# Patient Record
Sex: Male | Born: 1950 | Race: White | Hispanic: No | Marital: Married | Smoking: Former smoker
Health system: Southern US, Community
[De-identification: ages and names within clinical notes are randomized; demographics above are authoritative.]

## PROBLEM LIST (undated history)

## (undated) DIAGNOSIS — I5022 Chronic systolic (congestive) heart failure: Secondary | ICD-10-CM

## (undated) DIAGNOSIS — G603 Idiopathic progressive neuropathy: Secondary | ICD-10-CM

## (undated) DIAGNOSIS — C9590 Leukemia, unspecified not having achieved remission: Secondary | ICD-10-CM

## (undated) DIAGNOSIS — R7303 Prediabetes: Secondary | ICD-10-CM

## (undated) DIAGNOSIS — J479 Bronchiectasis, uncomplicated: Secondary | ICD-10-CM

## (undated) DIAGNOSIS — C801 Malignant (primary) neoplasm, unspecified: Secondary | ICD-10-CM

## (undated) DIAGNOSIS — I482 Chronic atrial fibrillation, unspecified: Secondary | ICD-10-CM

## (undated) DIAGNOSIS — I110 Hypertensive heart disease with heart failure: Secondary | ICD-10-CM

## (undated) DIAGNOSIS — B449 Aspergillosis, unspecified: Secondary | ICD-10-CM

## (undated) DIAGNOSIS — I4819 Other persistent atrial fibrillation: Secondary | ICD-10-CM

## (undated) DIAGNOSIS — G4733 Obstructive sleep apnea (adult) (pediatric): Secondary | ICD-10-CM

## (undated) HISTORY — DX: Idiopathic progressive neuropathy: G60.3

## (undated) HISTORY — DX: Chronic systolic (congestive) heart failure: I50.22

## (undated) HISTORY — DX: Obstructive sleep apnea (adult) (pediatric): G47.33

## (undated) HISTORY — PX: WRIST FRACTURE SURGERY: SHX121

## (undated) HISTORY — DX: Prediabetes: R73.03

## (undated) HISTORY — PX: HEMORRHOID SURGERY: SHX153

## (undated) HISTORY — DX: Other persistent atrial fibrillation: I48.19

## (undated) HISTORY — DX: Hypertensive heart disease with heart failure: I11.0

---

## 2012-02-13 ENCOUNTER — Other Ambulatory Visit (HOSPITAL_COMMUNITY)
Admission: RE | Admit: 2012-02-13 | Discharge: 2012-02-13 | Disposition: A | Discharge status: Home / Self Care | Payer: 59 | Source: Ambulatory Visit | Attending: Oncology | Admitting: Oncology

## 2012-02-13 DIAGNOSIS — D61818 Other pancytopenia: Secondary | ICD-10-CM | POA: Insufficient documentation

## 2013-12-29 DIAGNOSIS — Z9481 Bone marrow transplant status: Secondary | ICD-10-CM | POA: Insufficient documentation

## 2014-07-31 DIAGNOSIS — H2513 Age-related nuclear cataract, bilateral: Secondary | ICD-10-CM | POA: Insufficient documentation

## 2014-08-17 DIAGNOSIS — C9201 Acute myeloblastic leukemia, in remission: Secondary | ICD-10-CM | POA: Diagnosis not present

## 2014-08-17 DIAGNOSIS — N390 Urinary tract infection, site not specified: Secondary | ICD-10-CM | POA: Diagnosis not present

## 2014-08-17 DIAGNOSIS — J441 Chronic obstructive pulmonary disease with (acute) exacerbation: Secondary | ICD-10-CM | POA: Diagnosis not present

## 2014-08-17 DIAGNOSIS — Z6829 Body mass index (BMI) 29.0-29.9, adult: Secondary | ICD-10-CM | POA: Diagnosis not present

## 2014-08-19 DIAGNOSIS — J449 Chronic obstructive pulmonary disease, unspecified: Secondary | ICD-10-CM | POA: Diagnosis not present

## 2014-08-20 DIAGNOSIS — R740 Nonspecific elevation of levels of transaminase and lactic acid dehydrogenase [LDH]: Secondary | ICD-10-CM | POA: Diagnosis not present

## 2014-08-20 DIAGNOSIS — R319 Hematuria, unspecified: Secondary | ICD-10-CM | POA: Diagnosis not present

## 2014-08-20 DIAGNOSIS — R748 Abnormal levels of other serum enzymes: Secondary | ICD-10-CM | POA: Diagnosis not present

## 2014-08-20 DIAGNOSIS — J17 Pneumonia in diseases classified elsewhere: Secondary | ICD-10-CM | POA: Diagnosis not present

## 2014-08-20 DIAGNOSIS — Z9484 Stem cells transplant status: Secondary | ICD-10-CM | POA: Diagnosis not present

## 2014-08-20 DIAGNOSIS — K740 Hepatic fibrosis: Secondary | ICD-10-CM | POA: Diagnosis not present

## 2014-08-20 DIAGNOSIS — R7989 Other specified abnormal findings of blood chemistry: Secondary | ICD-10-CM | POA: Diagnosis not present

## 2014-08-20 DIAGNOSIS — C9201 Acute myeloblastic leukemia, in remission: Secondary | ICD-10-CM | POA: Diagnosis not present

## 2014-08-20 DIAGNOSIS — D89813 Graft-versus-host disease, unspecified: Secondary | ICD-10-CM | POA: Diagnosis not present

## 2014-08-20 DIAGNOSIS — B970 Adenovirus as the cause of diseases classified elsewhere: Secondary | ICD-10-CM | POA: Diagnosis not present

## 2014-08-20 DIAGNOSIS — R05 Cough: Secondary | ICD-10-CM | POA: Diagnosis not present

## 2014-08-20 DIAGNOSIS — J129 Viral pneumonia, unspecified: Secondary | ICD-10-CM | POA: Diagnosis not present

## 2014-08-20 DIAGNOSIS — K7689 Other specified diseases of liver: Secondary | ICD-10-CM | POA: Diagnosis not present

## 2014-08-20 DIAGNOSIS — J120 Adenoviral pneumonia: Secondary | ICD-10-CM | POA: Diagnosis not present

## 2014-08-20 DIAGNOSIS — R310 Gross hematuria: Secondary | ICD-10-CM | POA: Diagnosis not present

## 2014-08-20 DIAGNOSIS — N3091 Cystitis, unspecified with hematuria: Secondary | ICD-10-CM | POA: Diagnosis not present

## 2014-08-20 DIAGNOSIS — R198 Other specified symptoms and signs involving the digestive system and abdomen: Secondary | ICD-10-CM | POA: Diagnosis not present

## 2014-08-20 DIAGNOSIS — B348 Other viral infections of unspecified site: Secondary | ICD-10-CM | POA: Diagnosis not present

## 2014-08-20 DIAGNOSIS — J329 Chronic sinusitis, unspecified: Secondary | ICD-10-CM | POA: Diagnosis not present

## 2014-08-20 DIAGNOSIS — B340 Adenovirus infection, unspecified: Secondary | ICD-10-CM | POA: Diagnosis not present

## 2014-08-20 DIAGNOSIS — R918 Other nonspecific abnormal finding of lung field: Secondary | ICD-10-CM | POA: Diagnosis not present

## 2014-08-20 DIAGNOSIS — J1289 Other viral pneumonia: Secondary | ICD-10-CM | POA: Diagnosis not present

## 2014-08-20 DIAGNOSIS — C9200 Acute myeloblastic leukemia, not having achieved remission: Secondary | ICD-10-CM | POA: Diagnosis not present

## 2014-08-25 DIAGNOSIS — R21 Rash and other nonspecific skin eruption: Secondary | ICD-10-CM | POA: Diagnosis not present

## 2014-08-25 DIAGNOSIS — Z79899 Other long term (current) drug therapy: Secondary | ICD-10-CM | POA: Diagnosis not present

## 2014-08-25 DIAGNOSIS — J111 Influenza due to unidentified influenza virus with other respiratory manifestations: Secondary | ICD-10-CM | POA: Diagnosis not present

## 2014-08-25 DIAGNOSIS — Z9484 Stem cells transplant status: Secondary | ICD-10-CM | POA: Diagnosis not present

## 2014-08-25 DIAGNOSIS — R740 Nonspecific elevation of levels of transaminase and lactic acid dehydrogenase [LDH]: Secondary | ICD-10-CM | POA: Diagnosis not present

## 2014-08-25 DIAGNOSIS — N3090 Cystitis, unspecified without hematuria: Secondary | ICD-10-CM | POA: Diagnosis not present

## 2014-08-25 DIAGNOSIS — C9201 Acute myeloblastic leukemia, in remission: Secondary | ICD-10-CM | POA: Diagnosis not present

## 2014-08-25 DIAGNOSIS — R0989 Other specified symptoms and signs involving the circulatory and respiratory systems: Secondary | ICD-10-CM | POA: Diagnosis not present

## 2014-08-25 DIAGNOSIS — R7989 Other specified abnormal findings of blood chemistry: Secondary | ICD-10-CM | POA: Diagnosis not present

## 2014-08-25 DIAGNOSIS — R319 Hematuria, unspecified: Secondary | ICD-10-CM | POA: Diagnosis not present

## 2014-08-28 DIAGNOSIS — R300 Dysuria: Secondary | ICD-10-CM | POA: Diagnosis not present

## 2014-08-28 DIAGNOSIS — R740 Nonspecific elevation of levels of transaminase and lactic acid dehydrogenase [LDH]: Secondary | ICD-10-CM | POA: Diagnosis not present

## 2014-08-28 DIAGNOSIS — B349 Viral infection, unspecified: Secondary | ICD-10-CM | POA: Diagnosis not present

## 2014-08-28 DIAGNOSIS — J069 Acute upper respiratory infection, unspecified: Secondary | ICD-10-CM | POA: Diagnosis not present

## 2014-08-28 DIAGNOSIS — B340 Adenovirus infection, unspecified: Secondary | ICD-10-CM | POA: Diagnosis not present

## 2014-08-28 DIAGNOSIS — B970 Adenovirus as the cause of diseases classified elsewhere: Secondary | ICD-10-CM | POA: Diagnosis not present

## 2014-08-28 DIAGNOSIS — N3081 Other cystitis with hematuria: Secondary | ICD-10-CM | POA: Diagnosis not present

## 2014-08-28 DIAGNOSIS — C9201 Acute myeloblastic leukemia, in remission: Secondary | ICD-10-CM | POA: Diagnosis not present

## 2014-08-28 DIAGNOSIS — Z9484 Stem cells transplant status: Secondary | ICD-10-CM | POA: Diagnosis not present

## 2014-09-07 DIAGNOSIS — Z95828 Presence of other vascular implants and grafts: Secondary | ICD-10-CM | POA: Diagnosis not present

## 2014-09-07 DIAGNOSIS — Z9484 Stem cells transplant status: Secondary | ICD-10-CM | POA: Diagnosis not present

## 2014-09-07 DIAGNOSIS — B340 Adenovirus infection, unspecified: Secondary | ICD-10-CM | POA: Diagnosis not present

## 2014-09-07 DIAGNOSIS — C9201 Acute myeloblastic leukemia, in remission: Secondary | ICD-10-CM | POA: Diagnosis not present

## 2014-09-07 DIAGNOSIS — T865 Complications of stem cell transplant: Secondary | ICD-10-CM | POA: Diagnosis not present

## 2014-09-07 DIAGNOSIS — Z9481 Bone marrow transplant status: Secondary | ICD-10-CM | POA: Diagnosis not present

## 2014-09-07 DIAGNOSIS — I481 Persistent atrial fibrillation: Secondary | ICD-10-CM | POA: Diagnosis not present

## 2014-09-14 DIAGNOSIS — J984 Other disorders of lung: Secondary | ICD-10-CM | POA: Diagnosis not present

## 2014-09-14 DIAGNOSIS — T865 Complications of stem cell transplant: Secondary | ICD-10-CM | POA: Diagnosis not present

## 2014-09-14 DIAGNOSIS — M488X9 Other specified spondylopathies, site unspecified: Secondary | ICD-10-CM | POA: Diagnosis not present

## 2014-09-14 DIAGNOSIS — D7581 Myelofibrosis: Secondary | ICD-10-CM | POA: Diagnosis not present

## 2014-09-14 DIAGNOSIS — I5189 Other ill-defined heart diseases: Secondary | ICD-10-CM | POA: Diagnosis not present

## 2014-09-14 DIAGNOSIS — I348 Other nonrheumatic mitral valve disorders: Secondary | ICD-10-CM | POA: Diagnosis not present

## 2014-09-14 DIAGNOSIS — J189 Pneumonia, unspecified organism: Secondary | ICD-10-CM | POA: Diagnosis not present

## 2014-09-14 DIAGNOSIS — D89813 Graft-versus-host disease, unspecified: Secondary | ICD-10-CM | POA: Diagnosis not present

## 2014-09-14 DIAGNOSIS — Z9484 Stem cells transplant status: Secondary | ICD-10-CM | POA: Diagnosis not present

## 2014-09-14 DIAGNOSIS — I2510 Atherosclerotic heart disease of native coronary artery without angina pectoris: Secondary | ICD-10-CM | POA: Diagnosis not present

## 2014-09-14 DIAGNOSIS — I313 Pericardial effusion (noninflammatory): Secondary | ICD-10-CM | POA: Diagnosis not present

## 2014-09-14 DIAGNOSIS — I700 Atherosclerosis of aorta: Secondary | ICD-10-CM | POA: Diagnosis not present

## 2014-09-14 DIAGNOSIS — T8603 Bone marrow transplant infection: Secondary | ICD-10-CM | POA: Diagnosis not present

## 2014-09-14 DIAGNOSIS — R911 Solitary pulmonary nodule: Secondary | ICD-10-CM | POA: Diagnosis not present

## 2014-09-14 DIAGNOSIS — Z95828 Presence of other vascular implants and grafts: Secondary | ICD-10-CM | POA: Diagnosis not present

## 2014-09-14 DIAGNOSIS — Z9481 Bone marrow transplant status: Secondary | ICD-10-CM | POA: Diagnosis not present

## 2014-09-14 DIAGNOSIS — J120 Adenoviral pneumonia: Secondary | ICD-10-CM | POA: Diagnosis not present

## 2014-09-14 DIAGNOSIS — N3090 Cystitis, unspecified without hematuria: Secondary | ICD-10-CM | POA: Diagnosis not present

## 2014-09-14 DIAGNOSIS — C9201 Acute myeloblastic leukemia, in remission: Secondary | ICD-10-CM | POA: Diagnosis not present

## 2014-09-14 DIAGNOSIS — I358 Other nonrheumatic aortic valve disorders: Secondary | ICD-10-CM | POA: Diagnosis not present

## 2014-09-18 DIAGNOSIS — B340 Adenovirus infection, unspecified: Secondary | ICD-10-CM | POA: Diagnosis not present

## 2014-09-18 DIAGNOSIS — C9201 Acute myeloblastic leukemia, in remission: Secondary | ICD-10-CM | POA: Diagnosis not present

## 2014-09-21 DIAGNOSIS — T380X5D Adverse effect of glucocorticoids and synthetic analogues, subsequent encounter: Secondary | ICD-10-CM | POA: Diagnosis not present

## 2014-09-21 DIAGNOSIS — Z794 Long term (current) use of insulin: Secondary | ICD-10-CM | POA: Diagnosis not present

## 2014-09-21 DIAGNOSIS — Z792 Long term (current) use of antibiotics: Secondary | ICD-10-CM | POA: Diagnosis not present

## 2014-09-21 DIAGNOSIS — Z9484 Stem cells transplant status: Secondary | ICD-10-CM | POA: Diagnosis not present

## 2014-09-21 DIAGNOSIS — C9201 Acute myeloblastic leukemia, in remission: Secondary | ICD-10-CM | POA: Diagnosis not present

## 2014-09-21 DIAGNOSIS — Z7952 Long term (current) use of systemic steroids: Secondary | ICD-10-CM | POA: Diagnosis not present

## 2014-09-21 DIAGNOSIS — E099 Drug or chemical induced diabetes mellitus without complications: Secondary | ICD-10-CM | POA: Diagnosis not present

## 2014-09-21 DIAGNOSIS — D89813 Graft-versus-host disease, unspecified: Secondary | ICD-10-CM | POA: Diagnosis not present

## 2014-10-01 DIAGNOSIS — Z9484 Stem cells transplant status: Secondary | ICD-10-CM | POA: Diagnosis not present

## 2014-10-01 DIAGNOSIS — B441 Other pulmonary aspergillosis: Secondary | ICD-10-CM | POA: Diagnosis not present

## 2014-10-01 DIAGNOSIS — C9201 Acute myeloblastic leukemia, in remission: Secondary | ICD-10-CM | POA: Diagnosis not present

## 2014-10-01 DIAGNOSIS — D89810 Acute graft-versus-host disease: Secondary | ICD-10-CM | POA: Diagnosis not present

## 2014-10-05 DIAGNOSIS — D89811 Chronic graft-versus-host disease: Secondary | ICD-10-CM | POA: Diagnosis not present

## 2014-10-05 DIAGNOSIS — Z8619 Personal history of other infectious and parasitic diseases: Secondary | ICD-10-CM | POA: Diagnosis not present

## 2014-10-05 DIAGNOSIS — Z792 Long term (current) use of antibiotics: Secondary | ICD-10-CM | POA: Diagnosis not present

## 2014-10-05 DIAGNOSIS — R739 Hyperglycemia, unspecified: Secondary | ICD-10-CM | POA: Diagnosis not present

## 2014-10-05 DIAGNOSIS — Z794 Long term (current) use of insulin: Secondary | ICD-10-CM | POA: Diagnosis not present

## 2014-10-05 DIAGNOSIS — T380X5D Adverse effect of glucocorticoids and synthetic analogues, subsequent encounter: Secondary | ICD-10-CM | POA: Diagnosis not present

## 2014-10-05 DIAGNOSIS — C9200 Acute myeloblastic leukemia, not having achieved remission: Secondary | ICD-10-CM | POA: Diagnosis not present

## 2014-10-05 DIAGNOSIS — Z7952 Long term (current) use of systemic steroids: Secondary | ICD-10-CM | POA: Diagnosis not present

## 2014-10-05 DIAGNOSIS — Z833 Family history of diabetes mellitus: Secondary | ICD-10-CM | POA: Diagnosis not present

## 2014-10-05 DIAGNOSIS — Z9484 Stem cells transplant status: Secondary | ICD-10-CM | POA: Diagnosis not present

## 2014-10-05 DIAGNOSIS — B340 Adenovirus infection, unspecified: Secondary | ICD-10-CM | POA: Diagnosis not present

## 2014-10-05 DIAGNOSIS — C9201 Acute myeloblastic leukemia, in remission: Secondary | ICD-10-CM | POA: Diagnosis not present

## 2014-10-05 DIAGNOSIS — E099 Drug or chemical induced diabetes mellitus without complications: Secondary | ICD-10-CM | POA: Diagnosis not present

## 2014-10-12 DIAGNOSIS — B441 Other pulmonary aspergillosis: Secondary | ICD-10-CM | POA: Diagnosis not present

## 2014-10-12 DIAGNOSIS — C9201 Acute myeloblastic leukemia, in remission: Secondary | ICD-10-CM | POA: Diagnosis not present

## 2014-10-12 DIAGNOSIS — Z9484 Stem cells transplant status: Secondary | ICD-10-CM | POA: Diagnosis not present

## 2014-10-19 DIAGNOSIS — Z9481 Bone marrow transplant status: Secondary | ICD-10-CM | POA: Diagnosis not present

## 2014-10-19 DIAGNOSIS — Z794 Long term (current) use of insulin: Secondary | ICD-10-CM | POA: Diagnosis not present

## 2014-10-19 DIAGNOSIS — C9201 Acute myeloblastic leukemia, in remission: Secondary | ICD-10-CM | POA: Diagnosis not present

## 2014-10-19 DIAGNOSIS — C9200 Acute myeloblastic leukemia, not having achieved remission: Secondary | ICD-10-CM | POA: Diagnosis not present

## 2014-10-19 DIAGNOSIS — R739 Hyperglycemia, unspecified: Secondary | ICD-10-CM | POA: Diagnosis not present

## 2014-10-19 DIAGNOSIS — T380X5D Adverse effect of glucocorticoids and synthetic analogues, subsequent encounter: Secondary | ICD-10-CM | POA: Diagnosis not present

## 2014-10-19 DIAGNOSIS — B340 Adenovirus infection, unspecified: Secondary | ICD-10-CM | POA: Diagnosis not present

## 2014-10-19 DIAGNOSIS — J449 Chronic obstructive pulmonary disease, unspecified: Secondary | ICD-10-CM | POA: Diagnosis not present

## 2014-10-19 DIAGNOSIS — Z9484 Stem cells transplant status: Secondary | ICD-10-CM | POA: Diagnosis not present

## 2014-10-19 DIAGNOSIS — E099 Drug or chemical induced diabetes mellitus without complications: Secondary | ICD-10-CM | POA: Diagnosis not present

## 2014-10-26 DIAGNOSIS — Z9484 Stem cells transplant status: Secondary | ICD-10-CM | POA: Diagnosis not present

## 2014-10-26 DIAGNOSIS — D89810 Acute graft-versus-host disease: Secondary | ICD-10-CM | POA: Diagnosis not present

## 2014-10-26 DIAGNOSIS — C9201 Acute myeloblastic leukemia, in remission: Secondary | ICD-10-CM | POA: Diagnosis not present

## 2014-10-26 DIAGNOSIS — B441 Other pulmonary aspergillosis: Secondary | ICD-10-CM | POA: Diagnosis not present

## 2014-11-02 DIAGNOSIS — Z9484 Stem cells transplant status: Secondary | ICD-10-CM | POA: Diagnosis not present

## 2014-11-02 DIAGNOSIS — T865 Complications of stem cell transplant: Secondary | ICD-10-CM | POA: Diagnosis not present

## 2014-11-02 DIAGNOSIS — R748 Abnormal levels of other serum enzymes: Secondary | ICD-10-CM | POA: Diagnosis not present

## 2014-11-02 DIAGNOSIS — Z8619 Personal history of other infectious and parasitic diseases: Secondary | ICD-10-CM | POA: Diagnosis not present

## 2014-11-02 DIAGNOSIS — Z9481 Bone marrow transplant status: Secondary | ICD-10-CM | POA: Diagnosis not present

## 2014-11-02 DIAGNOSIS — C9201 Acute myeloblastic leukemia, in remission: Secondary | ICD-10-CM | POA: Diagnosis not present

## 2014-11-16 DIAGNOSIS — Z9484 Stem cells transplant status: Secondary | ICD-10-CM | POA: Diagnosis not present

## 2014-11-16 DIAGNOSIS — D899 Disorder involving the immune mechanism, unspecified: Secondary | ICD-10-CM | POA: Diagnosis not present

## 2014-11-16 DIAGNOSIS — Z7901 Long term (current) use of anticoagulants: Secondary | ICD-10-CM | POA: Diagnosis not present

## 2014-11-16 DIAGNOSIS — D89811 Chronic graft-versus-host disease: Secondary | ICD-10-CM | POA: Diagnosis not present

## 2014-11-16 DIAGNOSIS — R001 Bradycardia, unspecified: Secondary | ICD-10-CM | POA: Diagnosis not present

## 2014-11-16 DIAGNOSIS — I4891 Unspecified atrial fibrillation: Secondary | ICD-10-CM | POA: Diagnosis not present

## 2014-11-16 DIAGNOSIS — Z79899 Other long term (current) drug therapy: Secondary | ICD-10-CM | POA: Diagnosis not present

## 2014-11-16 DIAGNOSIS — I499 Cardiac arrhythmia, unspecified: Secondary | ICD-10-CM | POA: Diagnosis not present

## 2014-11-16 DIAGNOSIS — B340 Adenovirus infection, unspecified: Secondary | ICD-10-CM | POA: Diagnosis not present

## 2014-11-16 DIAGNOSIS — Z9481 Bone marrow transplant status: Secondary | ICD-10-CM | POA: Diagnosis not present

## 2014-11-16 DIAGNOSIS — Z794 Long term (current) use of insulin: Secondary | ICD-10-CM | POA: Diagnosis not present

## 2014-11-16 DIAGNOSIS — E099 Drug or chemical induced diabetes mellitus without complications: Secondary | ICD-10-CM | POA: Diagnosis not present

## 2014-11-16 DIAGNOSIS — C9201 Acute myeloblastic leukemia, in remission: Secondary | ICD-10-CM | POA: Diagnosis not present

## 2014-11-17 DIAGNOSIS — Z7952 Long term (current) use of systemic steroids: Secondary | ICD-10-CM | POA: Diagnosis not present

## 2014-11-17 DIAGNOSIS — E099 Drug or chemical induced diabetes mellitus without complications: Secondary | ICD-10-CM | POA: Diagnosis not present

## 2014-11-17 DIAGNOSIS — Z794 Long term (current) use of insulin: Secondary | ICD-10-CM | POA: Diagnosis not present

## 2014-11-17 DIAGNOSIS — E1165 Type 2 diabetes mellitus with hyperglycemia: Secondary | ICD-10-CM | POA: Diagnosis not present

## 2014-11-17 DIAGNOSIS — Z87891 Personal history of nicotine dependence: Secondary | ICD-10-CM | POA: Diagnosis not present

## 2014-11-17 DIAGNOSIS — T380X5A Adverse effect of glucocorticoids and synthetic analogues, initial encounter: Secondary | ICD-10-CM | POA: Diagnosis not present

## 2014-11-20 DIAGNOSIS — R0602 Shortness of breath: Secondary | ICD-10-CM | POA: Diagnosis not present

## 2014-11-20 DIAGNOSIS — I4891 Unspecified atrial fibrillation: Secondary | ICD-10-CM | POA: Diagnosis not present

## 2014-11-20 DIAGNOSIS — R0609 Other forms of dyspnea: Secondary | ICD-10-CM | POA: Diagnosis not present

## 2014-11-20 DIAGNOSIS — R942 Abnormal results of pulmonary function studies: Secondary | ICD-10-CM | POA: Diagnosis not present

## 2014-11-20 DIAGNOSIS — C9201 Acute myeloblastic leukemia, in remission: Secondary | ICD-10-CM | POA: Diagnosis not present

## 2014-11-20 DIAGNOSIS — Z885 Allergy status to narcotic agent status: Secondary | ICD-10-CM | POA: Diagnosis not present

## 2014-11-20 DIAGNOSIS — Z888 Allergy status to other drugs, medicaments and biological substances status: Secondary | ICD-10-CM | POA: Diagnosis not present

## 2014-11-20 DIAGNOSIS — Z9481 Bone marrow transplant status: Secondary | ICD-10-CM | POA: Diagnosis not present

## 2014-11-20 DIAGNOSIS — R5383 Other fatigue: Secondary | ICD-10-CM | POA: Diagnosis not present

## 2014-11-20 DIAGNOSIS — I481 Persistent atrial fibrillation: Secondary | ICD-10-CM | POA: Diagnosis not present

## 2014-11-20 DIAGNOSIS — J479 Bronchiectasis, uncomplicated: Secondary | ICD-10-CM | POA: Diagnosis not present

## 2014-11-20 DIAGNOSIS — D89811 Chronic graft-versus-host disease: Secondary | ICD-10-CM | POA: Diagnosis not present

## 2014-11-20 DIAGNOSIS — D89813 Graft-versus-host disease, unspecified: Secondary | ICD-10-CM | POA: Diagnosis not present

## 2014-11-20 DIAGNOSIS — Z87891 Personal history of nicotine dependence: Secondary | ICD-10-CM | POA: Diagnosis not present

## 2014-11-23 DIAGNOSIS — Z8614 Personal history of Methicillin resistant Staphylococcus aureus infection: Secondary | ICD-10-CM | POA: Diagnosis not present

## 2014-11-23 DIAGNOSIS — C9201 Acute myeloblastic leukemia, in remission: Secondary | ICD-10-CM | POA: Diagnosis not present

## 2014-11-23 DIAGNOSIS — Z888 Allergy status to other drugs, medicaments and biological substances status: Secondary | ICD-10-CM | POA: Diagnosis not present

## 2014-11-23 DIAGNOSIS — Z7901 Long term (current) use of anticoagulants: Secondary | ICD-10-CM | POA: Diagnosis not present

## 2014-11-23 DIAGNOSIS — Z87891 Personal history of nicotine dependence: Secondary | ICD-10-CM | POA: Diagnosis not present

## 2014-11-23 DIAGNOSIS — I4891 Unspecified atrial fibrillation: Secondary | ICD-10-CM | POA: Diagnosis not present

## 2014-11-23 DIAGNOSIS — Z9481 Bone marrow transplant status: Secondary | ICD-10-CM | POA: Diagnosis not present

## 2014-11-23 DIAGNOSIS — Z885 Allergy status to narcotic agent status: Secondary | ICD-10-CM | POA: Diagnosis not present

## 2014-11-23 DIAGNOSIS — I481 Persistent atrial fibrillation: Secondary | ICD-10-CM | POA: Diagnosis not present

## 2014-11-23 DIAGNOSIS — H2513 Age-related nuclear cataract, bilateral: Secondary | ICD-10-CM | POA: Diagnosis not present

## 2014-11-30 DIAGNOSIS — C9200 Acute myeloblastic leukemia, not having achieved remission: Secondary | ICD-10-CM | POA: Diagnosis not present

## 2014-11-30 DIAGNOSIS — D89811 Chronic graft-versus-host disease: Secondary | ICD-10-CM | POA: Diagnosis not present

## 2014-11-30 DIAGNOSIS — Z794 Long term (current) use of insulin: Secondary | ICD-10-CM | POA: Diagnosis not present

## 2014-11-30 DIAGNOSIS — E099 Drug or chemical induced diabetes mellitus without complications: Secondary | ICD-10-CM | POA: Diagnosis not present

## 2014-11-30 DIAGNOSIS — C9201 Acute myeloblastic leukemia, in remission: Secondary | ICD-10-CM | POA: Diagnosis not present

## 2014-11-30 DIAGNOSIS — Z95818 Presence of other cardiac implants and grafts: Secondary | ICD-10-CM | POA: Diagnosis not present

## 2014-11-30 DIAGNOSIS — T380X5A Adverse effect of glucocorticoids and synthetic analogues, initial encounter: Secondary | ICD-10-CM | POA: Diagnosis not present

## 2014-11-30 DIAGNOSIS — Z79899 Other long term (current) drug therapy: Secondary | ICD-10-CM | POA: Diagnosis not present

## 2014-11-30 DIAGNOSIS — D7581 Myelofibrosis: Secondary | ICD-10-CM | POA: Diagnosis not present

## 2014-11-30 DIAGNOSIS — Z9481 Bone marrow transplant status: Secondary | ICD-10-CM | POA: Diagnosis not present

## 2014-11-30 DIAGNOSIS — Z9484 Stem cells transplant status: Secondary | ICD-10-CM | POA: Diagnosis not present

## 2014-11-30 DIAGNOSIS — I4891 Unspecified atrial fibrillation: Secondary | ICD-10-CM | POA: Diagnosis not present

## 2014-12-09 DIAGNOSIS — I4891 Unspecified atrial fibrillation: Secondary | ICD-10-CM | POA: Diagnosis not present

## 2014-12-09 DIAGNOSIS — Z6827 Body mass index (BMI) 27.0-27.9, adult: Secondary | ICD-10-CM | POA: Diagnosis not present

## 2014-12-16 DIAGNOSIS — D539 Nutritional anemia, unspecified: Secondary | ICD-10-CM | POA: Diagnosis not present

## 2014-12-16 DIAGNOSIS — C9201 Acute myeloblastic leukemia, in remission: Secondary | ICD-10-CM | POA: Diagnosis not present

## 2014-12-16 DIAGNOSIS — Z9484 Stem cells transplant status: Secondary | ICD-10-CM | POA: Diagnosis not present

## 2014-12-23 DIAGNOSIS — S161XXA Strain of muscle, fascia and tendon at neck level, initial encounter: Secondary | ICD-10-CM | POA: Diagnosis not present

## 2014-12-23 DIAGNOSIS — Z6828 Body mass index (BMI) 28.0-28.9, adult: Secondary | ICD-10-CM | POA: Diagnosis not present

## 2014-12-24 DIAGNOSIS — S161XXD Strain of muscle, fascia and tendon at neck level, subsequent encounter: Secondary | ICD-10-CM | POA: Diagnosis not present

## 2014-12-28 DIAGNOSIS — S161XXD Strain of muscle, fascia and tendon at neck level, subsequent encounter: Secondary | ICD-10-CM | POA: Diagnosis not present

## 2014-12-31 DIAGNOSIS — S161XXD Strain of muscle, fascia and tendon at neck level, subsequent encounter: Secondary | ICD-10-CM | POA: Diagnosis not present

## 2015-01-13 DIAGNOSIS — Z9484 Stem cells transplant status: Secondary | ICD-10-CM | POA: Diagnosis not present

## 2015-01-13 DIAGNOSIS — E099 Drug or chemical induced diabetes mellitus without complications: Secondary | ICD-10-CM | POA: Diagnosis not present

## 2015-01-13 DIAGNOSIS — C9201 Acute myeloblastic leukemia, in remission: Secondary | ICD-10-CM | POA: Diagnosis not present

## 2015-01-13 DIAGNOSIS — Z006 Encounter for examination for normal comparison and control in clinical research program: Secondary | ICD-10-CM | POA: Diagnosis not present

## 2015-01-13 DIAGNOSIS — D89811 Chronic graft-versus-host disease: Secondary | ICD-10-CM | POA: Diagnosis not present

## 2015-01-13 DIAGNOSIS — I482 Chronic atrial fibrillation: Secondary | ICD-10-CM | POA: Diagnosis not present

## 2015-01-13 DIAGNOSIS — Z794 Long term (current) use of insulin: Secondary | ICD-10-CM | POA: Diagnosis not present

## 2015-01-13 DIAGNOSIS — D89813 Graft-versus-host disease, unspecified: Secondary | ICD-10-CM | POA: Diagnosis not present

## 2015-01-13 DIAGNOSIS — T380X5A Adverse effect of glucocorticoids and synthetic analogues, initial encounter: Secondary | ICD-10-CM | POA: Diagnosis not present

## 2015-01-13 DIAGNOSIS — R7989 Other specified abnormal findings of blood chemistry: Secondary | ICD-10-CM | POA: Diagnosis not present

## 2015-01-13 DIAGNOSIS — R740 Nonspecific elevation of levels of transaminase and lactic acid dehydrogenase [LDH]: Secondary | ICD-10-CM | POA: Diagnosis not present

## 2015-01-13 DIAGNOSIS — Z9481 Bone marrow transplant status: Secondary | ICD-10-CM | POA: Diagnosis not present

## 2015-01-20 DIAGNOSIS — R740 Nonspecific elevation of levels of transaminase and lactic acid dehydrogenase [LDH]: Secondary | ICD-10-CM | POA: Diagnosis not present

## 2015-01-20 DIAGNOSIS — C9202 Acute myeloblastic leukemia, in relapse: Secondary | ICD-10-CM | POA: Diagnosis not present

## 2015-01-20 DIAGNOSIS — Z9484 Stem cells transplant status: Secondary | ICD-10-CM | POA: Diagnosis not present

## 2015-01-28 DIAGNOSIS — Z87891 Personal history of nicotine dependence: Secondary | ICD-10-CM | POA: Diagnosis not present

## 2015-01-28 DIAGNOSIS — Z8619 Personal history of other infectious and parasitic diseases: Secondary | ICD-10-CM | POA: Diagnosis not present

## 2015-01-28 DIAGNOSIS — C9201 Acute myeloblastic leukemia, in remission: Secondary | ICD-10-CM | POA: Diagnosis not present

## 2015-01-28 DIAGNOSIS — D89811 Chronic graft-versus-host disease: Secondary | ICD-10-CM | POA: Diagnosis not present

## 2015-01-28 DIAGNOSIS — I4891 Unspecified atrial fibrillation: Secondary | ICD-10-CM | POA: Diagnosis not present

## 2015-01-28 DIAGNOSIS — Z9484 Stem cells transplant status: Secondary | ICD-10-CM | POA: Diagnosis not present

## 2015-01-28 DIAGNOSIS — Z9481 Bone marrow transplant status: Secondary | ICD-10-CM | POA: Diagnosis not present

## 2015-01-28 DIAGNOSIS — T865 Complications of stem cell transplant: Secondary | ICD-10-CM | POA: Diagnosis not present

## 2015-01-28 DIAGNOSIS — I252 Old myocardial infarction: Secondary | ICD-10-CM | POA: Diagnosis not present

## 2015-01-28 DIAGNOSIS — I481 Persistent atrial fibrillation: Secondary | ICD-10-CM | POA: Diagnosis not present

## 2015-01-28 DIAGNOSIS — D89813 Graft-versus-host disease, unspecified: Secondary | ICD-10-CM | POA: Diagnosis not present

## 2015-01-28 DIAGNOSIS — C9202 Acute myeloblastic leukemia, in relapse: Secondary | ICD-10-CM | POA: Diagnosis not present

## 2015-01-29 DIAGNOSIS — Z9481 Bone marrow transplant status: Secondary | ICD-10-CM | POA: Diagnosis not present

## 2015-01-29 DIAGNOSIS — R0683 Snoring: Secondary | ICD-10-CM | POA: Diagnosis not present

## 2015-01-29 DIAGNOSIS — G4730 Sleep apnea, unspecified: Secondary | ICD-10-CM | POA: Diagnosis not present

## 2015-02-10 DIAGNOSIS — G4733 Obstructive sleep apnea (adult) (pediatric): Secondary | ICD-10-CM | POA: Diagnosis not present

## 2015-02-10 DIAGNOSIS — Z6828 Body mass index (BMI) 28.0-28.9, adult: Secondary | ICD-10-CM | POA: Diagnosis not present

## 2015-02-22 DIAGNOSIS — Z9481 Bone marrow transplant status: Secondary | ICD-10-CM | POA: Diagnosis not present

## 2015-02-22 DIAGNOSIS — C9201 Acute myeloblastic leukemia, in remission: Secondary | ICD-10-CM | POA: Diagnosis not present

## 2015-02-22 DIAGNOSIS — C9200 Acute myeloblastic leukemia, not having achieved remission: Secondary | ICD-10-CM | POA: Diagnosis not present

## 2015-02-22 DIAGNOSIS — R740 Nonspecific elevation of levels of transaminase and lactic acid dehydrogenase [LDH]: Secondary | ICD-10-CM | POA: Diagnosis not present

## 2015-02-22 DIAGNOSIS — T8601 Bone marrow transplant rejection: Secondary | ICD-10-CM | POA: Diagnosis not present

## 2015-02-22 DIAGNOSIS — Z7901 Long term (current) use of anticoagulants: Secondary | ICD-10-CM | POA: Diagnosis not present

## 2015-02-22 DIAGNOSIS — D89811 Chronic graft-versus-host disease: Secondary | ICD-10-CM | POA: Diagnosis not present

## 2015-02-22 DIAGNOSIS — Z79899 Other long term (current) drug therapy: Secondary | ICD-10-CM | POA: Diagnosis not present

## 2015-02-26 DIAGNOSIS — G4733 Obstructive sleep apnea (adult) (pediatric): Secondary | ICD-10-CM | POA: Diagnosis not present

## 2015-02-26 DIAGNOSIS — R740 Nonspecific elevation of levels of transaminase and lactic acid dehydrogenase [LDH]: Secondary | ICD-10-CM | POA: Diagnosis not present

## 2015-02-26 DIAGNOSIS — J479 Bronchiectasis, uncomplicated: Secondary | ICD-10-CM | POA: Diagnosis not present

## 2015-02-26 DIAGNOSIS — D89813 Graft-versus-host disease, unspecified: Secondary | ICD-10-CM | POA: Diagnosis not present

## 2015-02-26 DIAGNOSIS — R942 Abnormal results of pulmonary function studies: Secondary | ICD-10-CM | POA: Diagnosis not present

## 2015-03-01 DIAGNOSIS — G4752 REM sleep behavior disorder: Secondary | ICD-10-CM | POA: Diagnosis not present

## 2015-03-01 DIAGNOSIS — Z6830 Body mass index (BMI) 30.0-30.9, adult: Secondary | ICD-10-CM | POA: Diagnosis not present

## 2015-03-01 DIAGNOSIS — G4733 Obstructive sleep apnea (adult) (pediatric): Secondary | ICD-10-CM | POA: Diagnosis not present

## 2015-03-05 DIAGNOSIS — J479 Bronchiectasis, uncomplicated: Secondary | ICD-10-CM | POA: Diagnosis not present

## 2015-03-05 DIAGNOSIS — J90 Pleural effusion, not elsewhere classified: Secondary | ICD-10-CM | POA: Diagnosis not present

## 2015-03-05 DIAGNOSIS — R942 Abnormal results of pulmonary function studies: Secondary | ICD-10-CM | POA: Diagnosis not present

## 2015-03-22 DIAGNOSIS — J811 Chronic pulmonary edema: Secondary | ICD-10-CM | POA: Diagnosis not present

## 2015-03-22 DIAGNOSIS — Z9484 Stem cells transplant status: Secondary | ICD-10-CM | POA: Diagnosis not present

## 2015-03-22 DIAGNOSIS — E8770 Fluid overload, unspecified: Secondary | ICD-10-CM | POA: Diagnosis not present

## 2015-03-22 DIAGNOSIS — C9201 Acute myeloblastic leukemia, in remission: Secondary | ICD-10-CM | POA: Diagnosis not present

## 2015-03-22 DIAGNOSIS — D89813 Graft-versus-host disease, unspecified: Secondary | ICD-10-CM | POA: Diagnosis not present

## 2015-03-22 DIAGNOSIS — Z9481 Bone marrow transplant status: Secondary | ICD-10-CM | POA: Diagnosis not present

## 2015-03-22 DIAGNOSIS — I5031 Acute diastolic (congestive) heart failure: Secondary | ICD-10-CM | POA: Diagnosis not present

## 2015-03-22 DIAGNOSIS — Z23 Encounter for immunization: Secondary | ICD-10-CM | POA: Diagnosis not present

## 2015-03-22 DIAGNOSIS — I509 Heart failure, unspecified: Secondary | ICD-10-CM | POA: Diagnosis not present

## 2015-04-26 DIAGNOSIS — Z8619 Personal history of other infectious and parasitic diseases: Secondary | ICD-10-CM | POA: Diagnosis not present

## 2015-04-26 DIAGNOSIS — Z9481 Bone marrow transplant status: Secondary | ICD-10-CM | POA: Diagnosis not present

## 2015-04-26 DIAGNOSIS — J479 Bronchiectasis, uncomplicated: Secondary | ICD-10-CM | POA: Diagnosis not present

## 2015-04-26 DIAGNOSIS — C9201 Acute myeloblastic leukemia, in remission: Secondary | ICD-10-CM | POA: Diagnosis not present

## 2015-04-26 DIAGNOSIS — D89811 Chronic graft-versus-host disease: Secondary | ICD-10-CM | POA: Diagnosis not present

## 2015-04-26 DIAGNOSIS — I482 Chronic atrial fibrillation: Secondary | ICD-10-CM | POA: Diagnosis not present

## 2015-04-26 DIAGNOSIS — Z7901 Long term (current) use of anticoagulants: Secondary | ICD-10-CM | POA: Diagnosis not present

## 2015-04-26 DIAGNOSIS — Z9484 Stem cells transplant status: Secondary | ICD-10-CM | POA: Diagnosis not present

## 2015-05-04 DIAGNOSIS — J479 Bronchiectasis, uncomplicated: Secondary | ICD-10-CM | POA: Diagnosis not present

## 2015-05-04 DIAGNOSIS — Z79899 Other long term (current) drug therapy: Secondary | ICD-10-CM | POA: Diagnosis not present

## 2015-05-04 DIAGNOSIS — D89813 Graft-versus-host disease, unspecified: Secondary | ICD-10-CM | POA: Diagnosis not present

## 2015-05-04 DIAGNOSIS — D89811 Chronic graft-versus-host disease: Secondary | ICD-10-CM | POA: Diagnosis not present

## 2015-05-04 DIAGNOSIS — Z7951 Long term (current) use of inhaled steroids: Secondary | ICD-10-CM | POA: Diagnosis not present

## 2015-05-04 DIAGNOSIS — G4733 Obstructive sleep apnea (adult) (pediatric): Secondary | ICD-10-CM | POA: Diagnosis not present

## 2015-05-04 DIAGNOSIS — I480 Paroxysmal atrial fibrillation: Secondary | ICD-10-CM | POA: Diagnosis not present

## 2015-05-04 DIAGNOSIS — Z23 Encounter for immunization: Secondary | ICD-10-CM | POA: Diagnosis not present

## 2015-05-04 DIAGNOSIS — Z7901 Long term (current) use of anticoagulants: Secondary | ICD-10-CM | POA: Diagnosis not present

## 2015-05-04 DIAGNOSIS — C9201 Acute myeloblastic leukemia, in remission: Secondary | ICD-10-CM | POA: Diagnosis not present

## 2015-05-04 DIAGNOSIS — Z792 Long term (current) use of antibiotics: Secondary | ICD-10-CM | POA: Diagnosis not present

## 2015-05-24 DIAGNOSIS — J479 Bronchiectasis, uncomplicated: Secondary | ICD-10-CM | POA: Diagnosis not present

## 2015-05-24 DIAGNOSIS — Z23 Encounter for immunization: Secondary | ICD-10-CM | POA: Diagnosis not present

## 2015-05-24 DIAGNOSIS — C9201 Acute myeloblastic leukemia, in remission: Secondary | ICD-10-CM | POA: Diagnosis not present

## 2015-05-24 DIAGNOSIS — D89811 Chronic graft-versus-host disease: Secondary | ICD-10-CM | POA: Diagnosis not present

## 2015-05-24 DIAGNOSIS — Z2803 Immunization not carried out because of immune compromised state of patient: Secondary | ICD-10-CM | POA: Diagnosis not present

## 2015-05-24 DIAGNOSIS — Z9481 Bone marrow transplant status: Secondary | ICD-10-CM | POA: Diagnosis not present

## 2015-06-09 DIAGNOSIS — C9201 Acute myeloblastic leukemia, in remission: Secondary | ICD-10-CM | POA: Diagnosis not present

## 2015-06-09 DIAGNOSIS — Z9481 Bone marrow transplant status: Secondary | ICD-10-CM | POA: Diagnosis not present

## 2015-06-09 DIAGNOSIS — L438 Other lichen planus: Secondary | ICD-10-CM | POA: Diagnosis not present

## 2015-06-09 DIAGNOSIS — D89811 Chronic graft-versus-host disease: Secondary | ICD-10-CM | POA: Diagnosis not present

## 2015-06-30 DIAGNOSIS — Z6830 Body mass index (BMI) 30.0-30.9, adult: Secondary | ICD-10-CM | POA: Diagnosis not present

## 2015-06-30 DIAGNOSIS — J441 Chronic obstructive pulmonary disease with (acute) exacerbation: Secondary | ICD-10-CM | POA: Diagnosis not present

## 2015-07-05 DIAGNOSIS — Z9481 Bone marrow transplant status: Secondary | ICD-10-CM | POA: Diagnosis not present

## 2015-07-05 DIAGNOSIS — R7989 Other specified abnormal findings of blood chemistry: Secondary | ICD-10-CM | POA: Diagnosis not present

## 2015-07-05 DIAGNOSIS — Z9484 Stem cells transplant status: Secondary | ICD-10-CM | POA: Diagnosis not present

## 2015-07-05 DIAGNOSIS — R61 Generalized hyperhidrosis: Secondary | ICD-10-CM | POA: Diagnosis not present

## 2015-07-05 DIAGNOSIS — I509 Heart failure, unspecified: Secondary | ICD-10-CM | POA: Diagnosis not present

## 2015-07-05 DIAGNOSIS — I4891 Unspecified atrial fibrillation: Secondary | ICD-10-CM | POA: Diagnosis not present

## 2015-07-05 DIAGNOSIS — C9201 Acute myeloblastic leukemia, in remission: Secondary | ICD-10-CM | POA: Diagnosis not present

## 2015-07-05 DIAGNOSIS — J479 Bronchiectasis, uncomplicated: Secondary | ICD-10-CM | POA: Diagnosis not present

## 2015-07-05 DIAGNOSIS — D89811 Chronic graft-versus-host disease: Secondary | ICD-10-CM | POA: Diagnosis not present

## 2015-07-05 DIAGNOSIS — Z8619 Personal history of other infectious and parasitic diseases: Secondary | ICD-10-CM | POA: Diagnosis not present

## 2015-09-09 DIAGNOSIS — Z9481 Bone marrow transplant status: Secondary | ICD-10-CM | POA: Diagnosis not present

## 2015-09-09 DIAGNOSIS — Z452 Encounter for adjustment and management of vascular access device: Secondary | ICD-10-CM | POA: Diagnosis not present

## 2015-09-09 DIAGNOSIS — R7989 Other specified abnormal findings of blood chemistry: Secondary | ICD-10-CM | POA: Diagnosis not present

## 2015-09-09 DIAGNOSIS — Z23 Encounter for immunization: Secondary | ICD-10-CM | POA: Diagnosis not present

## 2015-09-09 DIAGNOSIS — C9201 Acute myeloblastic leukemia, in remission: Secondary | ICD-10-CM | POA: Diagnosis not present

## 2015-09-09 DIAGNOSIS — Z79899 Other long term (current) drug therapy: Secondary | ICD-10-CM | POA: Diagnosis not present

## 2015-09-09 DIAGNOSIS — I4891 Unspecified atrial fibrillation: Secondary | ICD-10-CM | POA: Diagnosis not present

## 2015-09-27 DIAGNOSIS — J019 Acute sinusitis, unspecified: Secondary | ICD-10-CM | POA: Diagnosis not present

## 2015-09-27 DIAGNOSIS — Z6831 Body mass index (BMI) 31.0-31.9, adult: Secondary | ICD-10-CM | POA: Diagnosis not present

## 2015-10-04 DIAGNOSIS — J019 Acute sinusitis, unspecified: Secondary | ICD-10-CM | POA: Diagnosis not present

## 2015-10-04 DIAGNOSIS — R7309 Other abnormal glucose: Secondary | ICD-10-CM | POA: Diagnosis not present

## 2015-10-04 DIAGNOSIS — Z6830 Body mass index (BMI) 30.0-30.9, adult: Secondary | ICD-10-CM | POA: Diagnosis not present

## 2015-10-04 DIAGNOSIS — Z9481 Bone marrow transplant status: Secondary | ICD-10-CM | POA: Diagnosis not present

## 2015-10-06 DIAGNOSIS — K1321 Leukoplakia of oral mucosa, including tongue: Secondary | ICD-10-CM | POA: Diagnosis not present

## 2015-11-01 ENCOUNTER — Inpatient Hospital Stay (HOSPITAL_COMMUNITY)
Admission: EM | Admit: 2015-11-01 | Discharge: 2015-11-05 | DRG: 871 | Disposition: A | Discharge status: Home / Self Care | Payer: Medicare Other | Attending: Internal Medicine | Admitting: Internal Medicine

## 2015-11-01 ENCOUNTER — Emergency Department (HOSPITAL_COMMUNITY): Payer: Medicare Other

## 2015-11-01 ENCOUNTER — Encounter (HOSPITAL_COMMUNITY): Payer: Self-pay | Admitting: Emergency Medicine

## 2015-11-01 DIAGNOSIS — J9600 Acute respiratory failure, unspecified whether with hypoxia or hypercapnia: Secondary | ICD-10-CM | POA: Diagnosis not present

## 2015-11-01 DIAGNOSIS — I959 Hypotension, unspecified: Secondary | ICD-10-CM | POA: Diagnosis present

## 2015-11-01 DIAGNOSIS — F419 Anxiety disorder, unspecified: Secondary | ICD-10-CM | POA: Diagnosis present

## 2015-11-01 DIAGNOSIS — G4733 Obstructive sleep apnea (adult) (pediatric): Secondary | ICD-10-CM | POA: Diagnosis present

## 2015-11-01 DIAGNOSIS — B349 Viral infection, unspecified: Secondary | ICD-10-CM | POA: Diagnosis present

## 2015-11-01 DIAGNOSIS — A419 Sepsis, unspecified organism: Principal | ICD-10-CM | POA: Diagnosis present

## 2015-11-01 DIAGNOSIS — Z885 Allergy status to narcotic agent status: Secondary | ICD-10-CM | POA: Diagnosis not present

## 2015-11-01 DIAGNOSIS — R509 Fever, unspecified: Secondary | ICD-10-CM | POA: Diagnosis not present

## 2015-11-01 DIAGNOSIS — C9201 Acute myeloblastic leukemia, in remission: Secondary | ICD-10-CM | POA: Diagnosis present

## 2015-11-01 DIAGNOSIS — R404 Transient alteration of awareness: Secondary | ICD-10-CM | POA: Diagnosis not present

## 2015-11-01 DIAGNOSIS — R6520 Severe sepsis without septic shock: Secondary | ICD-10-CM

## 2015-11-01 DIAGNOSIS — Z888 Allergy status to other drugs, medicaments and biological substances status: Secondary | ICD-10-CM | POA: Diagnosis not present

## 2015-11-01 DIAGNOSIS — I481 Persistent atrial fibrillation: Secondary | ICD-10-CM

## 2015-11-01 DIAGNOSIS — J9601 Acute respiratory failure with hypoxia: Secondary | ICD-10-CM | POA: Diagnosis present

## 2015-11-01 DIAGNOSIS — D649 Anemia, unspecified: Secondary | ICD-10-CM | POA: Diagnosis present

## 2015-11-01 DIAGNOSIS — N390 Urinary tract infection, site not specified: Secondary | ICD-10-CM | POA: Diagnosis present

## 2015-11-01 DIAGNOSIS — I447 Left bundle-branch block, unspecified: Secondary | ICD-10-CM | POA: Diagnosis present

## 2015-11-01 DIAGNOSIS — Z9484 Stem cells transplant status: Secondary | ICD-10-CM

## 2015-11-01 DIAGNOSIS — Z87891 Personal history of nicotine dependence: Secondary | ICD-10-CM | POA: Diagnosis not present

## 2015-11-01 DIAGNOSIS — I248 Other forms of acute ischemic heart disease: Secondary | ICD-10-CM | POA: Diagnosis present

## 2015-11-01 DIAGNOSIS — I5021 Acute systolic (congestive) heart failure: Secondary | ICD-10-CM | POA: Diagnosis not present

## 2015-11-01 DIAGNOSIS — E663 Overweight: Secondary | ICD-10-CM | POA: Diagnosis present

## 2015-11-01 DIAGNOSIS — I5023 Acute on chronic systolic (congestive) heart failure: Secondary | ICD-10-CM | POA: Diagnosis not present

## 2015-11-01 DIAGNOSIS — R739 Hyperglycemia, unspecified: Secondary | ICD-10-CM | POA: Diagnosis present

## 2015-11-01 DIAGNOSIS — I471 Supraventricular tachycardia: Secondary | ICD-10-CM | POA: Diagnosis present

## 2015-11-01 DIAGNOSIS — J449 Chronic obstructive pulmonary disease, unspecified: Secondary | ICD-10-CM | POA: Diagnosis present

## 2015-11-01 DIAGNOSIS — Z6830 Body mass index (BMI) 30.0-30.9, adult: Secondary | ICD-10-CM | POA: Diagnosis not present

## 2015-11-01 DIAGNOSIS — R6521 Severe sepsis with septic shock: Secondary | ICD-10-CM | POA: Diagnosis present

## 2015-11-01 DIAGNOSIS — Z881 Allergy status to other antibiotic agents status: Secondary | ICD-10-CM | POA: Diagnosis not present

## 2015-11-01 DIAGNOSIS — J9690 Respiratory failure, unspecified, unspecified whether with hypoxia or hypercapnia: Secondary | ICD-10-CM

## 2015-11-01 DIAGNOSIS — I5043 Acute on chronic combined systolic (congestive) and diastolic (congestive) heart failure: Secondary | ICD-10-CM | POA: Diagnosis not present

## 2015-11-01 DIAGNOSIS — R0602 Shortness of breath: Secondary | ICD-10-CM | POA: Diagnosis not present

## 2015-11-01 DIAGNOSIS — I509 Heart failure, unspecified: Secondary | ICD-10-CM | POA: Diagnosis not present

## 2015-11-01 DIAGNOSIS — Z8701 Personal history of pneumonia (recurrent): Secondary | ICD-10-CM

## 2015-11-01 DIAGNOSIS — R531 Weakness: Secondary | ICD-10-CM | POA: Diagnosis not present

## 2015-11-01 DIAGNOSIS — I482 Chronic atrial fibrillation: Secondary | ICD-10-CM | POA: Diagnosis present

## 2015-11-01 DIAGNOSIS — R05 Cough: Secondary | ICD-10-CM | POA: Diagnosis not present

## 2015-11-01 DIAGNOSIS — N179 Acute kidney failure, unspecified: Secondary | ICD-10-CM | POA: Diagnosis present

## 2015-11-01 DIAGNOSIS — I4891 Unspecified atrial fibrillation: Secondary | ICD-10-CM | POA: Diagnosis present

## 2015-11-01 DIAGNOSIS — I2119 ST elevation (STEMI) myocardial infarction involving other coronary artery of inferior wall: Secondary | ICD-10-CM | POA: Diagnosis not present

## 2015-11-01 HISTORY — DX: Leukemia, unspecified not having achieved remission: C95.90

## 2015-11-01 HISTORY — DX: Obstructive sleep apnea (adult) (pediatric): G47.33

## 2015-11-01 HISTORY — DX: Malignant (primary) neoplasm, unspecified: C80.1

## 2015-11-01 HISTORY — DX: Bronchiectasis, uncomplicated: J47.9

## 2015-11-01 HISTORY — DX: Chronic atrial fibrillation, unspecified: I48.20

## 2015-11-01 HISTORY — DX: Aspergillosis, unspecified: B44.9

## 2015-11-01 LAB — CBC WITH DIFFERENTIAL/PLATELET
Basophils Absolute: 0 10*3/uL (ref 0.0–0.1)
Basophils Relative: 0 %
EOS PCT: 0 %
Eosinophils Absolute: 0 10*3/uL (ref 0.0–0.7)
HEMATOCRIT: 37.8 % — AB (ref 39.0–52.0)
Hemoglobin: 12.6 g/dL — ABNORMAL LOW (ref 13.0–17.0)
LYMPHS ABS: 2 10*3/uL (ref 0.7–4.0)
Lymphocytes Relative: 9 %
MCH: 33.9 pg (ref 26.0–34.0)
MCHC: 33.3 g/dL (ref 30.0–36.0)
MCV: 101.6 fL — ABNORMAL HIGH (ref 78.0–100.0)
MONO ABS: 1.8 10*3/uL — AB (ref 0.1–1.0)
Monocytes Relative: 8 %
NEUTROS ABS: 18.7 10*3/uL — AB (ref 1.7–7.7)
Neutrophils Relative %: 83 %
Platelets: 118 10*3/uL — ABNORMAL LOW (ref 150–400)
RBC: 3.72 MIL/uL — AB (ref 4.22–5.81)
RDW: 13.9 % (ref 11.5–15.5)
WBC Morphology: INCREASED
WBC: 22.5 10*3/uL — AB (ref 4.0–10.5)

## 2015-11-01 LAB — URINALYSIS, ROUTINE W REFLEX MICROSCOPIC
GLUCOSE, UA: NEGATIVE mg/dL
KETONES UR: 15 mg/dL — AB
NITRITE: POSITIVE — AB
PH: 5 (ref 5.0–8.0)
Protein, ur: 100 mg/dL — AB
Specific Gravity, Urine: 1.024 (ref 1.005–1.030)

## 2015-11-01 LAB — I-STAT CG4 LACTIC ACID, ED
Lactic Acid, Venous: 5.02 mmol/L (ref 0.5–2.0)
Lactic Acid, Venous: 5.81 mmol/L (ref 0.5–2.0)

## 2015-11-01 LAB — COMPREHENSIVE METABOLIC PANEL
ALBUMIN: 2.9 g/dL — AB (ref 3.5–5.0)
ALK PHOS: 61 U/L (ref 38–126)
ALT: 50 U/L (ref 17–63)
AST: 55 U/L — AB (ref 15–41)
Anion gap: 15 (ref 5–15)
BUN: 20 mg/dL (ref 6–20)
CHLORIDE: 104 mmol/L (ref 101–111)
CO2: 19 mmol/L — AB (ref 22–32)
CREATININE: 1.74 mg/dL — AB (ref 0.61–1.24)
Calcium: 8.6 mg/dL — ABNORMAL LOW (ref 8.9–10.3)
GFR calc non Af Amer: 39 mL/min — ABNORMAL LOW (ref >60)
GFR, EST AFRICAN AMERICAN: 46 mL/min — AB (ref >60)
GLUCOSE: 151 mg/dL — AB (ref 65–99)
Potassium: 4.5 mmol/L (ref 3.5–5.1)
SODIUM: 138 mmol/L (ref 135–145)
TOTAL PROTEIN: 7.1 g/dL (ref 6.5–8.1)
Total Bilirubin: 1.7 mg/dL — ABNORMAL HIGH (ref 0.3–1.2)

## 2015-11-01 LAB — INFLUENZA PANEL BY PCR (TYPE A & B)
H1N1 flu by pcr: NOT DETECTED
INFLBPCR: NEGATIVE
Influenza A By PCR: NEGATIVE

## 2015-11-01 LAB — URINE MICROSCOPIC-ADD ON

## 2015-11-01 LAB — TROPONIN I
TROPONIN I: 0.11 ng/mL — AB (ref <0.031)
Troponin I: 0.05 ng/mL — ABNORMAL HIGH (ref <0.031)

## 2015-11-01 LAB — GLUCOSE, CAPILLARY: Glucose-Capillary: 151 mg/dL — ABNORMAL HIGH (ref 65–99)

## 2015-11-01 LAB — LACTIC ACID, PLASMA: Lactic Acid, Venous: 4.8 mmol/L (ref 0.5–2.0)

## 2015-11-01 LAB — CORTISOL: Cortisol, Plasma: 27.4 ug/dL

## 2015-11-01 LAB — PROTIME-INR
INR: 1.6 — ABNORMAL HIGH (ref 0.00–1.49)
PROTHROMBIN TIME: 19.1 s — AB (ref 11.6–15.2)

## 2015-11-01 LAB — I-STAT TROPONIN, ED: Troponin i, poc: 0.04 ng/mL (ref 0.00–0.08)

## 2015-11-01 LAB — BRAIN NATRIURETIC PEPTIDE: B Natriuretic Peptide: 977.3 pg/mL — ABNORMAL HIGH (ref 0.0–100.0)

## 2015-11-01 MED ORDER — FLUTICASONE PROPIONATE 50 MCG/ACT NA SUSP
1.0000 | Freq: Every day | NASAL | Status: DC
  Administered 2015-11-02 – 2015-11-05 (×4): 1 via NASAL
  Filled 2015-11-01: qty 16

## 2015-11-01 MED ORDER — DEXTROSE 5 % IV SOLN
2.0000 g | INTRAVENOUS | Status: DC
  Administered 2015-11-02 – 2015-11-04 (×3): 2 g via INTRAVENOUS
  Filled 2015-11-01 (×4): qty 2

## 2015-11-01 MED ORDER — AMIODARONE HCL IN DEXTROSE 360-4.14 MG/200ML-% IV SOLN
60.0000 mg/h | INTRAVENOUS | Status: AC
  Administered 2015-11-01 (×2): 60 mg/h via INTRAVENOUS
  Filled 2015-11-01: qty 200

## 2015-11-01 MED ORDER — LEVALBUTEROL HCL 0.63 MG/3ML IN NEBU
0.6300 mg | INHALATION_SOLUTION | Freq: Four times a day (QID) | RESPIRATORY_TRACT | Status: DC
  Administered 2015-11-01 – 2015-11-04 (×9): 0.63 mg via RESPIRATORY_TRACT
  Filled 2015-11-01 (×11): qty 3

## 2015-11-01 MED ORDER — FUROSEMIDE 10 MG/ML IJ SOLN
INTRAMUSCULAR | Status: AC
  Filled 2015-11-01: qty 2

## 2015-11-01 MED ORDER — IPRATROPIUM BROMIDE 0.02 % IN SOLN
0.5000 mg | Freq: Four times a day (QID) | RESPIRATORY_TRACT | Status: DC
  Administered 2015-11-01 – 2015-11-04 (×10): 0.5 mg via RESPIRATORY_TRACT
  Filled 2015-11-01 (×11): qty 2.5

## 2015-11-01 MED ORDER — DEXTROSE 5 % IV SOLN
500.0000 mg | INTRAVENOUS | Status: DC
  Filled 2015-11-01: qty 500

## 2015-11-01 MED ORDER — FUROSEMIDE 10 MG/ML IJ SOLN
60.0000 mg | Freq: Once | INTRAMUSCULAR | Status: DC
  Filled 2015-11-01: qty 6

## 2015-11-01 MED ORDER — SODIUM CHLORIDE 0.9% FLUSH
3.0000 mL | Freq: Two times a day (BID) | INTRAVENOUS | Status: DC
  Administered 2015-11-01 – 2015-11-04 (×5): 3 mL via INTRAVENOUS

## 2015-11-01 MED ORDER — DEXTROSE 5 % IV SOLN
1.0000 g | Freq: Once | INTRAVENOUS | Status: AC
  Administered 2015-11-01: 1 g via INTRAVENOUS
  Filled 2015-11-01: qty 10

## 2015-11-01 MED ORDER — DEXTROSE 5 % IV SOLN
500.0000 mg | INTRAVENOUS | Status: DC
  Administered 2015-11-02: 500 mg via INTRAVENOUS
  Filled 2015-11-01: qty 500

## 2015-11-01 MED ORDER — METOPROLOL TARTRATE 1 MG/ML IV SOLN
2.5000 mg | Freq: Three times a day (TID) | INTRAVENOUS | Status: DC
  Administered 2015-11-02: 2.5 mg via INTRAVENOUS
  Filled 2015-11-01 (×2): qty 5

## 2015-11-01 MED ORDER — NOREPINEPHRINE BITARTRATE 1 MG/ML IV SOLN: 0.0000 ug/min | INTRAVENOUS | Status: DC

## 2015-11-01 MED ORDER — PHENYLEPHRINE HCL 10 MG/ML IJ SOLN
30.0000 ug/min | INTRAVENOUS | Status: DC
  Filled 2015-11-01: qty 1

## 2015-11-01 MED ORDER — SODIUM CHLORIDE 0.9 % IV BOLUS (SEPSIS)
1000.0000 mL | INTRAVENOUS | Status: AC
Start: 2015-11-01 — End: 2015-11-01
  Administered 2015-11-01 (×3): 1000 mL via INTRAVENOUS

## 2015-11-01 MED ORDER — SODIUM CHLORIDE 0.9 % IV SOLN
250.0000 mL | INTRAVENOUS | Status: DC | PRN
  Administered 2015-11-01: 250 mL via INTRAVENOUS

## 2015-11-01 MED ORDER — FUROSEMIDE 10 MG/ML IJ SOLN
INTRAMUSCULAR | Status: AC
  Filled 2015-11-01: qty 4

## 2015-11-01 MED ORDER — PANTOPRAZOLE SODIUM 40 MG IV SOLR: 40.0000 mg | Freq: Every day | INTRAVENOUS | Status: DC

## 2015-11-01 MED ORDER — ASPIRIN 300 MG RE SUPP: 300.0000 mg | RECTAL | Status: AC

## 2015-11-01 MED ORDER — DEXTROSE 5 % IV SOLN: 1.0000 g | INTRAVENOUS | Status: DC

## 2015-11-01 MED ORDER — AMIODARONE HCL IN DEXTROSE 360-4.14 MG/200ML-% IV SOLN
30.0000 mg/h | INTRAVENOUS | Status: DC
  Administered 2015-11-02 – 2015-11-03 (×3): 30 mg/h via INTRAVENOUS
  Filled 2015-11-01 (×7): qty 200

## 2015-11-01 MED ORDER — PANTOPRAZOLE SODIUM 40 MG PO TBEC
40.0000 mg | DELAYED_RELEASE_TABLET | Freq: Every day | ORAL | Status: DC
  Administered 2015-11-01 – 2015-11-04 (×4): 40 mg via ORAL
  Filled 2015-11-01 (×4): qty 1

## 2015-11-01 MED ORDER — APIXABAN 5 MG PO TABS
5.0000 mg | ORAL_TABLET | Freq: Two times a day (BID) | ORAL | Status: DC
  Administered 2015-11-01 – 2015-11-05 (×8): 5 mg via ORAL
  Filled 2015-11-01 (×10): qty 1

## 2015-11-01 MED ORDER — LEVALBUTEROL HCL 0.63 MG/3ML IN NEBU: 0.6300 mg | INHALATION_SOLUTION | Freq: Four times a day (QID) | RESPIRATORY_TRACT | Status: DC

## 2015-11-01 MED ORDER — AMIODARONE LOAD VIA INFUSION
150.0000 mg | Freq: Once | INTRAVENOUS | Status: AC
  Administered 2015-11-01: 150 mg via INTRAVENOUS
  Filled 2015-11-01: qty 83.34

## 2015-11-01 MED ORDER — ACETAMINOPHEN 500 MG PO TABS
1000.0000 mg | ORAL_TABLET | Freq: Once | ORAL | Status: AC
  Administered 2015-11-01: 1000 mg via ORAL
  Filled 2015-11-01: qty 2

## 2015-11-01 MED ORDER — DEXTROSE 5 % IV SOLN
500.0000 mg | Freq: Once | INTRAVENOUS | Status: AC
  Administered 2015-11-01: 500 mg via INTRAVENOUS
  Filled 2015-11-01: qty 500

## 2015-11-01 MED ORDER — MONTELUKAST SODIUM 10 MG PO TABS
10.0000 mg | ORAL_TABLET | Freq: Every day | ORAL | Status: DC
  Administered 2015-11-01 – 2015-11-04 (×4): 10 mg via ORAL
  Filled 2015-11-01 (×6): qty 1

## 2015-11-01 MED ORDER — FUROSEMIDE 10 MG/ML IJ SOLN
60.0000 mg | Freq: Once | INTRAMUSCULAR | Status: AC
  Administered 2015-11-01: 60 mg via INTRAVENOUS

## 2015-11-01 MED ORDER — SODIUM CHLORIDE 0.9% FLUSH: 3.0000 mL | INTRAVENOUS | Status: DC | PRN

## 2015-11-01 MED ORDER — AMIODARONE LOAD VIA INFUSION
150.0000 mg | Freq: Once | INTRAVENOUS | Status: AC
Start: 2015-11-01 — End: 2015-11-01
  Administered 2015-11-01: 150 mg via INTRAVENOUS

## 2015-11-01 MED ORDER — LEVALBUTEROL HCL 1.25 MG/0.5ML IN NEBU
1.2500 mg | INHALATION_SOLUTION | Freq: Once | RESPIRATORY_TRACT | Status: AC
  Administered 2015-11-01: 1.25 mg via RESPIRATORY_TRACT
  Filled 2015-11-01: qty 0.5

## 2015-11-01 MED ORDER — LORATADINE 10 MG PO TABS
10.0000 mg | ORAL_TABLET | Freq: Every day | ORAL | Status: DC
  Administered 2015-11-02 – 2015-11-05 (×4): 10 mg via ORAL
  Filled 2015-11-01 (×6): qty 1

## 2015-11-01 MED ORDER — ASPIRIN 81 MG PO CHEW: 324.0000 mg | CHEWABLE_TABLET | ORAL | Status: AC

## 2015-11-01 MED ORDER — SODIUM CHLORIDE 0.9 % IV SOLN: 250.0000 mL | INTRAVENOUS | Status: DC | PRN

## 2015-11-01 NOTE — Progress Notes (Signed)
Pt stated that he wears CPAP at home. Pt refuse CPAP for the night. Pt stated that he feels fine right now. Told pt if he experiences SOB/increase WOB throughout the night to please let RN know. Pt verbalized understanding. Pt is stable at this time no complication or distress noted.

## 2015-11-01 NOTE — H&P (Signed)
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 He has a 3 day history of subjective fevers, congestion, productive cough, and pleuritic chest pain from coughing. He presented to his PCP with these complaints 3/20 and was noted to be hypotensive there. He was also in AF-RVR. He was sent to the emergency department and received one liter IVF en route.  In the emergency department he was tachypneic with wheeze, and remained hypotensive, however BP had somewhat improved after the one liter. He was given xopenex neb which significantly helped his breathing and per RN reports he appeared much more comfortable after this. He received an additional 2+ liters which his BP responded nicely to, however his respiratory status worsened again. Repeat CXR showed worsening bilateral infiltrates concerning for pneumonitis vs edema. UA was notable for possible UTI with positive nitrites, however bacteria and leuks were small. Lactic acid elevated to 5.0. PCCM consulted.  PAST MEDICAL HISTORY :  He  has a past medical history of Cancer ([place]) and Leukemia ([place]).  PAST SURGICAL HISTORY: He  has no past surgical history on file.  Allergies  Allergen Reactions  . Codeine Nausea And Vomiting  . Levaquin [Levofloxacin In D5w]   . Phenergan [Promethazine Hcl]     Wild   . Tramadol Nausea And Vomiting    No current facility-administered medications on file prior to encounter.   No current outpatient prescriptions on file prior to encounter.    FAMILY HISTORY:  His  has no family status information on file.   SOCIAL HISTORY: He  reports that he has quit smoking. He does not have any smokeless tobacco history on file. He reports that he does not drink alcohol or use illicit drugs.  REVIEW OF SYSTEMS:   Bolds are positive  Constitutional: weight loss, gain, night sweats, Fevers, chills, fatigue .  HEENT: headaches, Sore throat, sneezing, nasal congestion, post nasal drip, Difficulty swallowing, Tooth/dental problems, visual complaints visual changes, ear ache CV:  chest pain, radiates: ,Orthopnea, PND, swelling in lower extremities, dizziness, palpitations, syncope.  GI  heartburn, indigestion, abdominal pain, nausea, vomiting, diarrhea, change in bowel habits, loss of appetite, bloody stools.  Resp: cough, productive: brown/yellow sputum , hemoptysis, dyspnea, chest pain, pleuritic.  Skin: rash or itching or icterus GU: dysuria, change in color of urine, urgency or frequency. flank pain, hematuria  MS: joint pain or swelling. decreased range of motion  Psych: change in mood or affect. depression or anxiety.  Neuro: difficulty with speech, weakness, numbness, ataxia   SUBJECTIVE:   VITAL SIGNS: BP 116/81 mmHg  Pulse 146  Temp(Src) 97.8 F (36.6 C) (Oral)  Resp 37  Ht '5\' 10"'$  (1.778 m)  Wt 95.709 kg (211 lb)  BMI 30.28 kg/m2  SpO2 92%  HEMODYNAMICS:    VENTILATOR SETTINGS:    INTAKE / OUTPUT:    PHYSICAL EXAMINATION: General:  Overweight male in moderate distress Neuro:  Alert, oriented, non-focal HEENT:  [place]/AT, PERRL, no appreciable JVD Cardiovascular:  IRIR tachy, no MRG Lungs:  Coarse bilateral breath sounds Abdomen:  Soft, non-tender,  non-distended Musculoskeletal:  No acute deformity or ROM limitation.  Skin:  Grossly intact  LABS:  BMET  Recent Labs Lab 11/01/15 1214  NA 138  K 4.5  CL 104  CO2 19*  BUN 20  CREATININE 1.74*  GLUCOSE 151*    Electrolytes  Recent Labs Lab 11/01/15 1214  CALCIUM 8.6*     CBC  Recent Labs Lab 11/01/15 1214  WBC 22.5*  HGB 12.6*  HCT 37.8*  PLT 118*    Coag's No results for input(s): APTT, INR in the last 168 hours.  Sepsis Markers  Recent Labs Lab 11/01/15 1224  LATICACIDVEN 5.02*    ABG No results for input(s): PHART, PCO2ART, PO2ART in the last 168 hours.  Liver Enzymes  Recent Labs Lab 11/01/15 1214  AST 55*  ALT 50  ALKPHOS 61  BILITOT 1.7*  ALBUMIN 2.9*    Cardiac Enzymes No results for input(s): TROPONINI, PROBNP in the last 168 hours.  Glucose No results for input(s): GLUCAP in the last 168 hours.  Imaging Dg Chest Port 1 View  11/01/2015  CLINICAL DATA:  Cough and congestion for 3 days. History of atrial fibrillation. EXAM: PORTABLE CHEST 1 VIEW COMPARISON:  None. FINDINGS: 1149 hours. The heart size and mediastinal contours are normal. There is aortic atherosclerosis. Patchy retrocardiac left lower lobe airspace disease may reflect atelectasis or an infiltrate. The right lung is clear. There is no edema, pleural effusion or pneumothorax. The bones appear unremarkable. IMPRESSION: Left lower lobe atelectasis or infiltrate. A lateral view may be helpful when the patient is able. Followup PA and lateral chest X-ray is recommended in 3-4 weeks, following trial of antibiotic therapy if clinically warranted, to ensure resolution and exclude underlying malignancy. Electronically Signed   By: Richardean Sale M.D.   On: 11/01/2015 12:09     STUDIES:    CULTURES: BCx2 3/20 > Urine 3/20 > Sputum 3/20 > RVP 3/20 > FLU PCR neg  ANTIBIOTICS: Ceftraixone 3/20 > Azithromycin 3/20 >   SIGNIFICANT EVENTS: 3/20 admit  LINES/TUBES:   ASSESSMENT / PLAN:  PULMONARY A: Acute hypoxemic respiratory failure secondary to CAP vs Viral pneumonitis vs Pulmonary edema COPD without acute exacerbation OSA on CPAP History of aspergillosis PNA  P:   Continue BiPAP PRN, hope to avoid intubation Repeat CXR in AM Check  ABG Scheduled , PRN nebs, defer steroids.   CARDIOVASCULAR A:  Atrial fibrillation with RVR, suspect this is respiratory mediated Acute on chronic CHF Elevated lactic  P:  Telemetry monitoring Hope RVR will correct with BiPAP support, may need amiodarone Echo Repeat lactic acid Cycle troponin Lasix '60mg'$  IV  Metoprolol 2.5 q 8 hours starting 1800  RENAL A:   AKI  P:   S/p 3L, IVF to saline lock Follow BMP Correct electrolytes as indicated  GASTROINTESTINAL A:   No acute issues  P:   NPO on BiPAP PPI for SUP  HEMATOLOGIC A:   On Eliquis for AF Mild anemia  P:  Continue eliquis Follow CBC  INFECTIOUS A:   Severe sepsis, unclear etiology likely pulmonary vs urinary source.  P:   ABX as above Follow cultures Trend PCT Follow WBC and fever curves  ENDOCRINE A:   Hyperglycemia without history DM   P:   Monitor glucose on BMP  NEUROLOGIC A:   No acute issues  P:   RASS goal: 0 Follow BMP   FAMILY  - Updates: patient and wife updated in ED  - Inter-disciplinary family meet or Palliative  Care meeting due by:  3/27   Georgann Housekeeper, AGACNP-BC [facility] Pager [phone] or [phone]  11/01/2015 3:36 PM

## 2015-11-01 NOTE — ED Notes (Signed)
Pt c/o bloating to abdomen, EDP aware and at bedside to assess pt. Pt tachypneic. Pt received approx 156m from 3rd bolus.

## 2015-11-01 NOTE — Progress Notes (Signed)
eLink Physician-Brief Progress Note Patient Name: Patrick Durham DOB: [birth year] MRN: [id number]   Date of Service  11/01/2015  HPI/Events of Note  Hypotension and elevated Lactic Acid. BP = 80/50. Lactic Acid 5.02 >> 5.81. Has had 3 liters of crystalloid. Pro-BNP = 977.  eICU Interventions  Will order: 1. Norepinephrine IV infusion. 2. Notified nurse practitioner, Georgann Housekeeper, of need for central venous line placement.     Intervention Category Intermediate Interventions: Hypotension - evaluation and management  Lupe Bonner Eugene 11/01/2015, 4:06 PM

## 2015-11-01 NOTE — Progress Notes (Signed)
Pt is off NIV at this time per MD Mcquaid, pt has been titrated to a [place] 3L. No distress or complications noted. Pt is comfortable at this time.

## 2015-11-01 NOTE — Progress Notes (Signed)
Pharmacy Code Sepsis Protocol  Time of code sepsis page: 1143 '[x]'$  Antibiotics delivered at 1148 '[]'$  Antibiotics administered prior to code at  (if checked, omit next 2 questions)  Were antibiotics ordered at the time of the code sepsis page? Yes Was it required to contact the physician? '[x]'$  Physician not contacted '[]'$  Physician contacted to order antibiotics for code sepsis '[]'$  Physician contacted to recommend changing antibiotics  Pharmacy consulted for: N/A  Anti-infectives    Start     Dose/Rate Route Frequency Ordered Stop   11/02/15 1200  azithromycin (ZITHROMAX) 500 mg in dextrose 5 % 250 mL IVPB     500 mg 250 mL/hr over 60 Minutes Intravenous Every 24 hours 11/01/15 1136     11/02/15 1200  cefTRIAXone (ROCEPHIN) 1 g in dextrose 5 % 50 mL IVPB     1 g 100 mL/hr over 30 Minutes Intravenous Every 24 hours 11/01/15 1136     11/01/15 1145  cefTRIAXone (ROCEPHIN) 1 g in dextrose 5 % 50 mL IVPB     1 g 100 mL/hr over 30 Minutes Intravenous  Once 11/01/15 1134     11/01/15 1145  azithromycin (ZITHROMAX) 500 mg in dextrose 5 % 250 mL IVPB     500 mg 250 mL/hr over 60 Minutes Intravenous  Once 11/01/15 1134          Nurse education provided: '[x]'$  Minutes left to administer antibiotics to achieve 1 hour goal '[x]'$  Correct order of antibiotic administration '[x]'$  Antibiotic Y-site compatibilities     Kendale Rembold, Rande Lawman, PharmD 11/01/2015, 11:48 AM

## 2015-11-01 NOTE — ED Notes (Signed)
Pt here from MD office with hypotension- EMS reports BP of 80/palp. Pt is in afib constantly. Pt reports that he has been congested and febrile since Saturday. Pt also reports feeling weak since that time. Hx of stem cell transplant in April 2015. Pt received 1037m NS pta. CP when breathing.

## 2015-11-01 NOTE — ED Provider Notes (Signed)
CSN: [id number]     Arrival date & time 11/01/15  1107 History   First MD Initiated Contact with Patient 11/01/15 1119     Chief Complaint  Patient presents with  . Hypotension  . Chest Pain  . Atrial Fibrillation  . Fever     (Consider location/radiation/quality/duration/timing/severity/associated sxs/prior Treatment) HPI  65 year old male with a past history of leukemia who is now status post stem cell transplant 2 years ago presents from his doctor's office with hypotension. Patient has been having cough and congestion for the last 3 days. Has been short of breath for the last 2. He is coughing up brown sputum. Denies fevers. No chest pain. Patient is chronically in A. fib but was noted to be in RVR at the doctor's office. He received a breathing treatment that did not seem to help much. He has been feeling weak. He feels like his chest is congested. No leg swelling and he does not feel like prior episodes of fluid overload. He takes Lasix only as needed. Initial blood pressure at the doctor's office was 80 systolic. He has not currently on immunosuppressants.  Past Medical History  Diagnosis Date  . Cancer ([place])   . Leukemia ([place])    History reviewed. No pertinent past surgical history. History reviewed. No pertinent family history. Social History  Substance Use Topics  . Smoking status: Former Research scientist (life sciences)  . Smokeless tobacco: None  . Alcohol Use: No    Review of Systems  Constitutional: Negative for fever.  HENT: Positive for congestion.   Respiratory: Positive for cough and shortness of breath.   Cardiovascular: Negative for chest pain and leg swelling.  Neurological: Positive for weakness.  All other systems reviewed and are negative.     Allergies  Levaquin; Phenergan; and Tramadol  Home Medications   Prior to Admission medications   Not on File   BP 94/71 mmHg  Pulse 157  Temp(Src) 97.8 F (36.6 C) (Oral)  Resp 23  Ht '5\' 10"'$  (1.778 m)  Wt 211 lb (95.709 kg)   BMI 30.28 kg/m2  SpO2 100% Physical Exam  Constitutional: He is oriented to person, place, and time. He appears well-developed and well-nourished.  HENT:  Head: Normocephalic and atraumatic.  Right Ear: External ear normal.  Left Ear: External ear normal.  Nose: Nose normal.  Eyes: Right eye exhibits no discharge. Left eye exhibits no discharge.  Neck: Neck supple.  Cardiovascular: Normal heart sounds and intact distal pulses.  An irregular rhythm present. Tachycardia present.   Pulmonary/Chest: Effort normal. Tachypnea noted. He has wheezes (diffuse, expiratory).  Abdominal: Soft. There is no tenderness.  Musculoskeletal: He exhibits no edema.  Neurological: He is alert and oriented to person, place, and time.  Skin: Skin is warm and dry.  Nursing note and vitals reviewed.   ED Course  Procedures (including critical care time) Labs Review Labs Reviewed  COMPREHENSIVE METABOLIC PANEL - Abnormal; Notable for the following:    CO2 19 (*)    Glucose, Bld 151 (*)    Creatinine, Ser 1.74 (*)    Calcium 8.6 (*)    Albumin 2.9 (*)    AST 55 (*)    Total Bilirubin 1.7 (*)    GFR calc non Af Amer 39 (*)    GFR calc Af Amer 46 (*)    All other components within normal limits  CBC WITH DIFFERENTIAL/PLATELET - Abnormal; Notable for the following:    WBC 22.5 (*)    RBC 3.72 (*)  Hemoglobin 12.6 (*)    HCT 37.8 (*)    MCV 101.6 (*)    Platelets 118 (*)    Neutro Abs 18.7 (*)    Monocytes Absolute 1.8 (*)    All other components within normal limits  URINALYSIS, ROUTINE W REFLEX MICROSCOPIC (NOT AT [facility]) - Abnormal; Notable for the following:    Color, Urine AMBER (*)    APPearance HAZY (*)    Hgb urine dipstick LARGE (*)    Bilirubin Urine SMALL (*)    Ketones, ur 15 (*)    Protein, ur 100 (*)    Nitrite POSITIVE (*)    Leukocytes, UA SMALL (*)    All other components within normal limits  BRAIN NATRIURETIC PEPTIDE - Abnormal; Notable for the following:    B Natriuretic  Peptide 977.3 (*)    All other components within normal limits  URINE MICROSCOPIC-ADD ON - Abnormal; Notable for the following:    Squamous Epithelial / LPF 0-5 (*)    Bacteria, UA FEW (*)    Casts GRANULAR CAST (*)    Crystals CA OXALATE CRYSTALS (*)    All other components within normal limits  TROPONIN I - Abnormal; Notable for the following:    Troponin I 0.11 (*)    All other components within normal limits  LACTIC ACID, PLASMA - Abnormal; Notable for the following:    Lactic Acid, Venous 4.8 (*)    All other components within normal limits  PROTIME-INR - Abnormal; Notable for the following:    Prothrombin Time 19.1 (*)    INR 1.60 (*)    All other components within normal limits  I-STAT CG4 LACTIC ACID, ED - Abnormal; Notable for the following:    Lactic Acid, Venous 5.02 (*)    All other components within normal limits  I-STAT CG4 LACTIC ACID, ED - Abnormal; Notable for the following:    Lactic Acid, Venous 5.81 (*)    All other components within normal limits  CULTURE, BLOOD (ROUTINE X 2)  CULTURE, BLOOD (ROUTINE X 2)  URINE CULTURE  RESPIRATORY VIRUS PANEL  CULTURE, EXPECTORATED SPUTUM-ASSESSMENT  INFLUENZA PANEL BY PCR (TYPE A & B, H1N1)  TROPONIN I  TROPONIN I  CORTISOL  I-STAT TROPOININ, ED    Imaging Review Dg Chest Portable 1 View  11/01/2015  CLINICAL DATA:  Shortness of breath with fever and cough EXAM: PORTABLE CHEST 1 VIEW COMPARISON:  Film from earlier in the same day FINDINGS: Cardiac shadow is stable. The lungs are well aerated bilaterally. Persistent increased density is noted in the left lung base stable from the previous exam. No new focal abnormality is seen. IMPRESSION: No significant change from the prior study. Electronically Signed   By: Inez Catalina M.D.   On: 11/01/2015 14:26   Dg Chest Port 1 View  11/01/2015  CLINICAL DATA:  Cough and congestion for 3 days. History of atrial fibrillation. EXAM: PORTABLE CHEST 1 VIEW COMPARISON:  None.  FINDINGS: 1149 hours. The heart size and mediastinal contours are normal. There is aortic atherosclerosis. Patchy retrocardiac left lower lobe airspace disease may reflect atelectasis or an infiltrate. The right lung is clear. There is no edema, pleural effusion or pneumothorax. The bones appear unremarkable. IMPRESSION: Left lower lobe atelectasis or infiltrate. A lateral view may be helpful when the patient is able. Followup PA and lateral chest X-ray is recommended in 3-4 weeks, following trial of antibiotic therapy if clinically warranted, to ensure resolution and exclude underlying malignancy. Electronically Signed  By: Richardean Sale M.D.   On: 11/01/2015 12:09   I have personally reviewed and evaluated these images and lab results as part of my medical decision-making.   EKG Interpretation   Date/Time:  Monday November 01 2015 11:15:26 EDT Ventricular Rate:  149 PR Interval:    QRS Duration: 112 QT Interval:  332 QTC Calculation: 523 R Axis:   -62 Text Interpretation:  Atrial fibrillation Incomplete left bundle branch  block Left ventricular hypertrophy Inferior infarct, old Prolonged QT  interval No old tracing to compare Confirmed by Dereonna Lensing  MD, Kainalu Heggs ([id number])  on 11/01/2015 11:34:51 AM      CRITICAL CARE Performed by: Sherwood Gambler T   Total critical care time: 45 minutes  Critical care time was exclusive of separately billable procedures and treating other patients.  Critical care was necessary to treat or prevent imminent or life-threatening deterioration.  Critical care was time spent personally by me on the following activities: development of treatment plan with patient and/or surrogate as well as nursing, discussions with consultants, evaluation of patient's response to treatment, examination of patient, obtaining history from patient or surrogate, ordering and performing treatments and interventions, ordering and review of laboratory studies, ordering and review of  radiographic studies, pulse oximetry and re-evaluation of patient's condition.  MDM   Final diagnoses:  Severe sepsis ([place])  Acute respiratory failure, unspecified whether with hypoxia or hypercapnia ([facility])  Atrial fibrillation with RVR ([place])    Patient appears to have acute pneumonia versus viral process. Started on sepsis protocol with 30 mL/KG fluid bolus. After 2 L out of his total of 3 L he started complaining of abdominal pain and trouble breathing. The fluids were stopped (he also got 1L by EMS) and patient was eventually placed on BiPAP after critical care consultation. He started feeling better. He has intermittent leg dropped his blood pressure but currently his blood pressures in the 90s. Lactate is not improving despite adequate fluids and antibiotics. Unclear if his A. fib is playing a role in this. Critical care currently managing patient and he will need to be admitted to the ICU. Patient is currently mentating well and feels like his work of breathing has improved.   Sherwood Gambler, MD 11/01/15 [phone]

## 2015-11-02 ENCOUNTER — Inpatient Hospital Stay (HOSPITAL_COMMUNITY): Payer: Medicare Other

## 2015-11-02 ENCOUNTER — Ambulatory Visit (HOSPITAL_COMMUNITY): Payer: Medicare Other

## 2015-11-02 ENCOUNTER — Encounter (HOSPITAL_COMMUNITY): Payer: Self-pay | Admitting: Nurse Practitioner

## 2015-11-02 DIAGNOSIS — I482 Chronic atrial fibrillation: Secondary | ICD-10-CM

## 2015-11-02 DIAGNOSIS — I509 Heart failure, unspecified: Secondary | ICD-10-CM

## 2015-11-02 DIAGNOSIS — I4891 Unspecified atrial fibrillation: Secondary | ICD-10-CM

## 2015-11-02 DIAGNOSIS — I5021 Acute systolic (congestive) heart failure: Secondary | ICD-10-CM

## 2015-11-02 LAB — BASIC METABOLIC PANEL
Anion gap: 9 (ref 5–15)
BUN: 24 mg/dL — AB (ref 6–20)
CALCIUM: 7.8 mg/dL — AB (ref 8.9–10.3)
CHLORIDE: 107 mmol/L (ref 101–111)
CO2: 22 mmol/L (ref 22–32)
CREATININE: 1.25 mg/dL — AB (ref 0.61–1.24)
GFR calc Af Amer: >60 mL/min (ref >60)
GFR, EST NON AFRICAN AMERICAN: 59 mL/min — AB (ref >60)
Glucose, Bld: 126 mg/dL — ABNORMAL HIGH (ref 65–99)
Potassium: 3.7 mmol/L (ref 3.5–5.1)
SODIUM: 138 mmol/L (ref 135–145)

## 2015-11-02 LAB — TROPONIN I: TROPONIN I: 0.05 ng/mL — AB (ref <0.031)

## 2015-11-02 LAB — ECHOCARDIOGRAM COMPLETE
Height: 70 in
Weight: 3372.16 oz

## 2015-11-02 LAB — CBC
HCT: 34 % — ABNORMAL LOW (ref 39.0–52.0)
Hemoglobin: 11.4 g/dL — ABNORMAL LOW (ref 13.0–17.0)
MCH: 33.7 pg (ref 26.0–34.0)
MCHC: 33.5 g/dL (ref 30.0–36.0)
MCV: 100.6 fL — AB (ref 78.0–100.0)
PLATELETS: 110 10*3/uL — AB (ref 150–400)
RBC: 3.38 MIL/uL — ABNORMAL LOW (ref 4.22–5.81)
RDW: 14.2 % (ref 11.5–15.5)
WBC: 17.4 10*3/uL — AB (ref 4.0–10.5)

## 2015-11-02 LAB — URINE CULTURE: CULTURE: NO GROWTH

## 2015-11-02 LAB — MRSA PCR SCREENING: MRSA BY PCR: NEGATIVE

## 2015-11-02 LAB — PROCALCITONIN: Procalcitonin: 20.66 ng/mL

## 2015-11-02 LAB — LACTIC ACID, PLASMA: Lactic Acid, Venous: 1.7 mmol/L (ref 0.5–2.0)

## 2015-11-02 LAB — MAGNESIUM: Magnesium: 1.5 mg/dL — ABNORMAL LOW (ref 1.7–2.4)

## 2015-11-02 LAB — PHOSPHORUS: Phosphorus: 2.2 mg/dL — ABNORMAL LOW (ref 2.5–4.6)

## 2015-11-02 LAB — GLUCOSE, CAPILLARY: Glucose-Capillary: 98 mg/dL (ref 65–99)

## 2015-11-02 MED ORDER — METOPROLOL TARTRATE 1 MG/ML IV SOLN
5.0000 mg | Freq: Four times a day (QID) | INTRAVENOUS | Status: DC
  Administered 2015-11-02 – 2015-11-03 (×4): 5 mg via INTRAVENOUS
  Filled 2015-11-02 (×4): qty 5

## 2015-11-02 MED ORDER — METOPROLOL TARTRATE 25 MG PO TABS
25.0000 mg | ORAL_TABLET | Freq: Two times a day (BID) | ORAL | Status: DC
  Filled 2015-11-02 (×2): qty 1

## 2015-11-02 MED ORDER — AZITHROMYCIN 500 MG PO TABS
500.0000 mg | ORAL_TABLET | Freq: Every day | ORAL | Status: DC
  Administered 2015-11-03 – 2015-11-05 (×3): 500 mg via ORAL
  Filled 2015-11-02 (×6): qty 1

## 2015-11-02 MED ORDER — METOPROLOL TARTRATE 12.5 MG HALF TABLET
12.5000 mg | ORAL_TABLET | Freq: Two times a day (BID) | ORAL | Status: DC
  Filled 2015-11-02: qty 1

## 2015-11-02 MED ORDER — ALPRAZOLAM 0.25 MG PO TABS
0.2500 mg | ORAL_TABLET | Freq: Once | ORAL | Status: AC
  Administered 2015-11-03: 0.25 mg via ORAL
  Filled 2015-11-02: qty 1

## 2015-11-02 NOTE — Progress Notes (Signed)
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 He has a 3 day history of subjective fevers, congestion, productive cough, and pleuritic chest pain from coughing. He presented to his PCP with these complaints 3/20 and was noted to be hypotensive there. He was also in AF-RVR. He was sent to the emergency department and received one liter IVF en route.  In the emergency department he was tachypneic with wheeze, and remained hypotensive, however BP had somewhat improved after the one liter. He was given xopenex neb which significantly helped his breathing and per RN reports he appeared much more comfortable after this. He received an additional 2+ liters which his BP responded nicely to, however his respiratory status worsened again. Repeat CXR showed worsening bilateral infiltrates concerning for pneumonitis vs edema. UA was notable for possible UTI with positive nitrites, however bacteria and leuks were small. Lactic acid elevated to 5.0. PCCM consulted.    SUBJECTIVE:  Much improved  VITAL SIGNS: BP 105/84 mmHg  Pulse 126  Temp(Src) 98.8 F (37.1 C) (Oral)  Resp 21  Ht '5\' 10"'$  (1.778 m)  Wt 210 lb 12.2 oz (95.6 kg)  BMI 30.24 kg/m2  SpO2 98%  HEMODYNAMICS:    VENTILATOR SETTINGS: Vent Mode:  [-] BIPAP;PCV FiO2 (%):  [40 %] 40 % Set Rate:  [15 bmp] 15 bmp PEEP:  [5 cmH20] 5 cmH20 Plateau Pressure:  [10 cmH20] 10 cmH20  INTAKE / OUTPUT: I/O last 3 completed shifts: In: 579.6 [I.V.:579.6] Out: 1800 [Urine:1800]  PHYSICAL  EXAMINATION: General:  Overweight male in no distress. (I'M 100% better) no distress Neuro:  Alert, oriented, non-focal HEENT:  [place]/AT, PERRL, no appreciable JVD Cardiovascular:  IRIR tachy, no MRG, 130's Lungs:  Coarse bilateral breath sounds Abdomen:  Soft, non-tender, non-distended Musculoskeletal:  No acute deformity or ROM limitation.  Skin:  Grossly intact  LABS:  BMET  Recent Labs Lab 11/01/15 1214 11/02/15 0230  NA 138 138  K 4.5 3.7  CL 104 107  CO2 19* 22  BUN 20 24*  CREATININE 1.74* 1.25*  GLUCOSE 151* 126*    Electrolytes  Recent Labs Lab 11/01/15 1214 11/02/15 0230  CALCIUM 8.6* 7.8*  MG  --  1.5*  PHOS  --  2.2*    CBC  Recent Labs Lab 11/01/15 1214 11/02/15 0230  WBC 22.5* 17.4*  HGB 12.6* 11.4*  HCT 37.8* 34.0*  PLT 118* 110*    Coag's  Recent Labs Lab 11/01/15 1533  INR 1.60*    Sepsis Markers  Recent Labs Lab 11/01/15 1533 11/01/15 1553 11/02/15 0501  LATICACIDVEN 4.8* 5.81* 1.7    ABG No results for input(s): PHART, PCO2ART, PO2ART in the last 168 hours.  Liver Enzymes  Recent Labs Lab 11/01/15 1214  AST 55*  ALT 50  ALKPHOS 61  BILITOT 1.7*  ALBUMIN 2.9*    Cardiac Enzymes  Recent Labs Lab 11/01/15 1533 11/01/15 2100 11/02/15 0230  TROPONINI 0.11* 0.05* 0.05*    Glucose  Recent Labs Lab 11/01/15 2001 11/02/15 0803  GLUCAP 151* 98  Imaging Dg Chest Port 1 View  11/02/2015  CLINICAL DATA:  Respiratory failure, sepsis, former smoker ; leukemia. EXAM: PORTABLE CHEST 1 VIEW COMPARISON:  Portable chest x-ray of November 01, 2015 FINDINGS: The lungs are adequately inflated. The interstitial markings remain increased bilaterally. The left hemidiaphragm remains partially obscured but the right hemidiaphragm is better demonstrated. The cardiac silhouette is mildly enlarged. The pulmonary vascularity is not engorged. IMPRESSION: Slight interval improvement in the appearance of the lung bases especially on  the right. This may indicate resolving atelectasis or infiltrate. Stable mild cardiac enlargement without significant pulmonary vascular congestion. A follow-up PA and lateral chest x-ray would be useful. Electronically Signed   By: David  Martinique M.D.   On: 11/02/2015 07:35   Dg Chest Portable 1 View  11/01/2015  CLINICAL DATA:  Shortness of breath with fever and cough EXAM: PORTABLE CHEST 1 VIEW COMPARISON:  Film from earlier in the same day FINDINGS: Cardiac shadow is stable. The lungs are well aerated bilaterally. Persistent increased density is noted in the left lung base stable from the previous exam. No new focal abnormality is seen. IMPRESSION: No significant change from the prior study. Electronically Signed   By: Inez Catalina M.D.   On: 11/01/2015 14:26   Dg Chest Port 1 View  11/01/2015  CLINICAL DATA:  Cough and congestion for 3 days. History of atrial fibrillation. EXAM: PORTABLE CHEST 1 VIEW COMPARISON:  None. FINDINGS: 1149 hours. The heart size and mediastinal contours are normal. There is aortic atherosclerosis. Patchy retrocardiac left lower lobe airspace disease may reflect atelectasis or an infiltrate. The right lung is clear. There is no edema, pleural effusion or pneumothorax. The bones appear unremarkable. IMPRESSION: Left lower lobe atelectasis or infiltrate. A lateral view may be helpful when the patient is able. Followup PA and lateral chest X-ray is recommended in 3-4 weeks, following trial of antibiotic therapy if clinically warranted, to ensure resolution and exclude underlying malignancy. Electronically Signed   By: Richardean Sale M.D.   On: 11/01/2015 12:09     STUDIES:    CULTURES: BCx2 3/20 > Urine 3/20 > Sputum 3/20 > RVP 3/20 > FLU PCR neg  ANTIBIOTICS: Ceftraixone 3/20 > Azithromycin 3/20 >   SIGNIFICANT EVENTS: 3/20 admit  LINES/TUBES:   ASSESSMENT / PLAN:  PULMONARY A: Acute hypoxemic respiratory failure secondary to CAP vs Viral pneumonitis vs  Pulmonary edema. Resolved COPD without acute exacerbation OSA on CPAP, on at night History of aspergillosis PNA  P:   Stable , no need for bipap Repeat CXR in AM Scheduled , PRN nebs, defer steroids.   CARDIOVASCULAR A:  Atrial fibrillation with RVR, suspect this is respiratory mediated Acute on chronic CHF Elevated lactic  P:  Telemetry monitoring Hope RVR will correct with BiPAP support, may need amiodarone. He has chronic SVT. Consider Cards consult Echo>> Repeat lactic acid, now nl at 1.7 on 3/21 Cycle troponin>>o.5  Lasix '60mg'$  IV  Metoprolol 2.5 q 8 hours starting 1800, consider change to PO lopressor  RENAL Lab Results  Component Value Date   CREATININE 1.25* 11/02/2015   CREATININE 1.74* 11/01/2015    A:   AKI  P:   S/p 3L, IVF to saline lock Follow BMP Correct electrolytes as indicated  GASTROINTESTINAL A:   No acute issues  P:   Start diet PPI for SUP  HEMATOLOGIC A:   On Eliquis for AF Mild anemia  P:  Continue eliquis Follow CBC  INFECTIOUS A:   Severe sepsis, unclear  etiology likely pulmonary vs urinary source.  P:   ABX as above Follow cultures Follow WBC and fever curves  ENDOCRINE CBG (last 3)   Recent Labs  11/01/15 2001 11/02/15 0803  GLUCAP 151* 98     A:   Hyperglycemia without history DM   P:   Monitor glucose on BMP  NEUROLOGIC A:   No acute issues  P:   RASS goal: 0 Follow BMP   FAMILY  - Updates: patient and wife updated in ED  - Inter-disciplinary family meet or Palliative Care meeting due by:  3/27    Global: Resume home lopressor. Start diet. Consult cards, tx to tele floor and to [facility] on 3/22.  CCT 30 minutes.  Richardson Landry Minor ACNP Maryanna Shape PCCM Pager [phone] till 3 pm If no answer page [phone] 11/02/2015, 11:29 AM

## 2015-11-02 NOTE — Progress Notes (Signed)
Utilization Review Completed.Donne Anon T3/21/2017

## 2015-11-02 NOTE — Consult Note (Signed)
Cardiology Consult    Patient ID: Patrick Durham MRN: [id number], DOB/AGE: November 27, 1950   Admit date: 11/01/2015 Date of Consult: 11/02/2015  Primary Physician: No primary care provider on file. Primary Cardiologist: Dr. Rosebud Poles at [facility] Requesting Provider: CCM  Patient Profile    65 y/o ? with a h/o AML and chronic AF, who presented with resp failure and rapid AF.   Past Medical History   Past Medical History  Diagnosis Date  . Cancer ([place])   . Leukemia ([place])   . Bronchiectasis ([place])   . Chronic atrial fibrillation ([place])     a. Dx 11/2013 -> s/p attempted dccv x 2 (11/2013, 11/2014);  b. CHA2DS2VASc = 1-2 (? h/o CHF - uses prn lasix)-->Eliquis 5 bid.  . OSA (obstructive sleep apnea)     on CPAP  . 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 No pertinent past surgical history.   Allergies  Allergies  Allergen Reactions  . Prochlorperazine     Other reaction(s): Delusions (intolerance)  . Codeine Nausea And Vomiting  . Levaquin [Levofloxacin In D5w]   . Phenergan [Promethazine Hcl]     Wild   . Promethazine     Other reaction(s): Delusions (intolerance) Sleepy, disoriented, amnesia  . Tramadol Nausea And Vomiting    History of Present Illness    65 y.o. 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  Patient has a h/o of Acute Myelogenous Leukemia in remission since 02/29/12. Pt had relapse in 07/31/13, and 08/19/13. He had allogenic stem cell transplant 12/02/13.  He also has a h/o chronic atrial fibrillation on lopressor and Eliquis at home.  He has been followed @ [place].  This was first dx in 2015 following his stem cell transplant.  He says that he underwent DCCV @ that time but it didn't hold.  At one point he was on dilt but this caused severe hypotension.  He had also been on  digoxin at one point, which provided good rate control, but was subsequently d/c'd 2/2 concern for interactions with his anti-rejection meds following stem cell transplant.  In 11/2014, cardioversion was attempted again to help with his chronic symptoms of fatigue and exercise intolerance (somewhat attributed to his chronic predisone).  He did initially convert but this was short lived and he has since been in chronic, relatively rate-controlled afib.  Review of Care Everywhere shows that he had an echo on 11/23/14 that demonstrated normal EF of 64%, no pulmonary hypertension and trivial pericardial effusion.  ECG in 01/2015 showed rate of 86 with inf and antsept infarcts.   He was in his usoh until late last week, when he began to develop chest congestion, sinus congestion, and malaise.  He was seen by his PCP on 3/20, and found to be tachycardic and hypotensive.  He was referred to [facility] ED.  Here, he was hypotensive initially and in rapid afib with rvr rate 14o's.  He was treated with IVF with improved BP and xopenex with initial improvement in dyspnea.  With ongoing hydration however, he developed recurrent dyspnea and CHF requiring IV diuresis.  His home metoprolol was held due his hypotension and placed on amiodarone for rate control.  Admitted to ICU by CCM being treated for probable viral illness and hypoxemic respiratory failure after volume resuscitation but covering for possible CAP with  rocephin and zithromax.  Unfortunately, he has remained tachycardic with rates generally in the 130's to 140's.  In that setting, an echo was performed and shows mod reduction in LV fxn, with an EF of 40-45% with normal wall motion.  It was felt that EF was likely underestimated in the setting of rapid AF.  Despite elevated rates, he is asymptomatic and denies chest pain, palpitations, presyncope/syncope.  His resp status has improved some since admission and he is awaiting tx to the floor.  Inpatient Medications      . apixaban  5 mg Oral BID  . [START ON 11/03/2015] azithromycin  500 mg Oral Daily  . cefTRIAXone (ROCEPHIN)  IV  2 g Intravenous Q24H  . fluticasone  1 spray Each Nare Daily  . ipratropium  0.5 mg Nebulization Q6H  . levalbuterol  0.63 mg Nebulization Q6H  . loratadine  10 mg Oral Daily  . metoprolol  5 mg Intravenous 4 times per day  . montelukast  10 mg Oral QHS  . pantoprazole  40 mg Oral QHS  . sodium chloride flush  3 mL Intravenous Q12H    Family History    Family History  Problem Relation Age of Onset  . Other      no premature CAD    Social History    Social History   Social History  . Marital Status: Married    Spouse Name: N/A  . Number of Children: N/A  . Years of Education: N/A   Occupational History  . Not on file.   Social History Main Topics  . Smoking status: Former Smoker -- 1.00 packs/day for 40 years    Types: Cigarettes  . Smokeless tobacco: Not on file     Comment: quit 2013  . Alcohol Use: No     Comment: prev heavy drinker - quit ~ 2013  . Drug Use: No  . Sexual Activity: Not on file   Other Topics Concern  . Not on file   Social History Narrative   Lives in [place] with his wife.  Very active @ home.     Review of Systems    General:  No chills, fever, night sweats or weight changes.  Cardiovascular:  No chest pain, +++ dyspnea on exertion, no edema, orthopnea, palpitations, paroxysmal nocturnal dyspnea. Dermatological: No rash, lesions/masses Respiratory: +++ cough, dyspnea Urologic: No hematuria, dysuria Abdominal:   No nausea, vomiting, diarrhea, bright red blood per rectum, melena, or hematemesis Neurologic:  No visual changes, wkns, changes in mental status. All other systems reviewed and are otherwise negative except as noted above.  Physical Exam    Blood pressure 121/79, pulse 76, temperature 98.3 F (36.8 C), temperature source Oral, resp. rate 22, height '5\' 10"'$  (1.778 m), weight 210 lb 12.2 oz (95.6 kg), SpO2 96 %.   General: Ple[place], NAD Psych: Normal affect. Neuro: Alert and oriented X 3. Moves all extremities spontaneously. HEENT: Normal  Neck: Supple without bruits or JVD. Lungs:  Resp regular and unlabored, coarse breath sounds throughout.  Faint exp wheezing. Heart: IR, IR, tachy, no s3, s4, or murmurs. Abdomen: Soft, non-tender, non-distended, BS + x 4.  Extremities: No clubbing, cyanosis or edema. DP/PT/Radials 2+ and equal bilaterally.  Labs    Troponin [facility] of Care Test)  Recent Labs  11/01/15 1222  TROPIPOC 0.04    Recent Labs  11/01/15 1533 11/01/15 2100 11/02/15 0230  TROPONINI 0.11* 0.05* 0.05*   Lab Results  Component Value Date   WBC  17.4* 2015-11-13   HGB 11.4* 11/13/2015   HCT 34.0* 2015/11/13   MCV 100.6* 2015-11-13   PLT 110* 11/13/2015     Recent Labs Lab 11/01/15 1214 11-13-2015 0230  NA 138 138  K 4.5 3.7  CL 104 107  CO2 19* 22  BUN 20 24*  CREATININE 1.74* 1.25*  CALCIUM 8.6* 7.8*  PROT 7.1  --   BILITOT 1.7*  --   ALKPHOS 61  --   ALT 50  --   AST 55*  --   GLUCOSE 151* 126*     Radiology Studies    Dg Chest Port 1 View  2015/11/13  CLINICAL DATA:  Respiratory failure, sepsis, former smoker ; leukemia. EXAM: PORTABLE CHEST 1 VIEW COMPARISON:  Portable chest x-ray of November 01, 2015 FINDINGS: The lungs are adequately inflated. The interstitial markings remain increased bilaterally. The left hemidiaphragm remains partially obscured but the right hemidiaphragm is better demonstrated. The cardiac silhouette is mildly enlarged. The pulmonary vascularity is not engorged. IMPRESSION: Slight interval improvement in the appearance of the lung bases especially on the right. This may indicate resolving atelectasis or infiltrate. Stable mild cardiac enlargement without significant pulmonary vascular congestion. A follow-up PA and lateral chest x-ray would be useful. Electronically Signed   By: David  Martinique M.D.   On: 11-13-15 07:35   Dg Chest Portable  1 View  11/01/2015  CLINICAL DATA:  Shortness of breath with fever and cough EXAM: PORTABLE CHEST 1 VIEW COMPARISON:  Film from earlier in the same day FINDINGS: Cardiac shadow is stable. The lungs are well aerated bilaterally. Persistent increased density is noted in the left lung base stable from the previous exam. No new focal abnormality is seen. IMPRESSION: No significant change from the prior study. Electronically Signed   By: Inez Catalina M.D.   On: 11/01/2015 14:26   Dg Chest Port 1 View  11/01/2015  CLINICAL DATA:  Cough and congestion for 3 days. History of atrial fibrillation. EXAM: PORTABLE CHEST 1 VIEW COMPARISON:  None. FINDINGS: 1149 hours. The heart size and mediastinal contours are normal. There is aortic atherosclerosis. Patchy retrocardiac left lower lobe airspace disease may reflect atelectasis or an infiltrate. The right lung is clear. There is no edema, pleural effusion or pneumothorax. The bones appear unremarkable. IMPRESSION: Left lower lobe atelectasis or infiltrate. A lateral view may be helpful when the patient is able. Followup PA and lateral chest X-ray is recommended in 3-4 weeks, following trial of antibiotic therapy if clinically warranted, to ensure resolution and exclude underlying malignancy. Electronically Signed   By: Richardean Sale M.D.   On: 11/01/2015 12:09    ECG & Cardiac Imaging    AF 149, LAD, inf/antsept infarcts - similar to report from 01/2015 in care everywhere. _____________  Echo 11/13/2022  Impressions:  - Mildly decreased LVEF 40-45%, that might be underestimated  secondary to tachycardia during acquisition.  Study Conclusions  - Left ventricle: The cavity size was normal. Systolic function was  mildly to moderately reduced. The estimated ejection fraction was  in the range of 40% to 45%. Wall motion was normal; there were no  regional wall motion abnormalities. The study was not technically  sufficient to allow evaluation of LV diastolic  dysfunction due to  atrial fibrillation. - Aortic valve: There was trivial regurgitation. - Aortic root: The aortic root was normal in size. - Mitral valve: Moderately calcified annulus. Moderately thickened,  moderately calcified leaflets . There was mild regurgitation. - Left atrium: The  atrium was moderately dilated. - Right ventricle: Systolic function was normal. - Right atrium: The atrium was moderately dilated. - Tricuspid valve: There was mild regurgitation. - Pulmonary arteries: Systolic pressure was mildly increased. PA  peak pressure: 37 mm Hg (S). - Inferior vena cava: The vessel was normal in size. - Pericardium, extracardiac: A trivial pericardial effusion was  identified. Features were not consistent with tamponade  physiology.  Assessment & Plan    1.  Acute Hypoxic Resp Failure:  Presented with a 3 day h/o congestion/cough and found to be hypotensive and hypoxic in the setting of AF RVR, presumed viral illness, and evidence of CHF.  Afebrile.  WBC 22.5 on admission (8.3 in January), lactate as high as 5.81.  He is currently being treated with abx and inhalers and has been feeling better.  Currently appears euvolemic on exam.  EF mod depressed on echo however this is likely underestimated in the setting of tachycardia.   2.  Chronic Atrial fib with current RVR:  First dx in 11/2013 with attempted DCCV x 2 in the past - last 11/2014.  He says that he has always been asymptomatic and believes that his rates always elevated fairly significantly with activity at home.  Suspect elevated rates at this time are secondary to #1.  He has been on amio, which hasn't done much to improve his rate.  He does seem to be responsive to IV  blocker.  As such, for now, will add lopressor 5 mg IV q6h.  I suspect that his BP will improve with better rate control, at which point we can switch him back to oral  blocker.   Cont eliquis.  3.  Troponin elevation/LV dysfxn:  Mild elevation with flat  trend in setting of #1, hypotension, and rapid AF.  Suspect demand ischemia.  No chest pain.  EF reduced on echo but he was tachycardic at the time.  Currently no evidence of CHF.  ECG w/o acute ischemic changes.  Defer any further ischemic eval to his primary cardiologist @ [facility].  Will need f/u echo at some point when rate better, as outpt.  Signed, Murray Hodgkins, NP 11/02/2015, 5:54 PM  Patient seen, examined. Available data reviewed. Agree with findings, assessment, and plan as outlined by Ignacia Bayley, NP. The patient is independently interviewed and examined. He is alert and oriented, in no distress. JVP is normal. Heart is tachycardic and irregular with a grade 2/6 systolic ejection murmur at the right upper sternal border. There are coarse bilateral breath sounds with good air movement and no crackles. There is no peripheral edema. Telemetry shows atrial fibrillation with RVR. The patient has chronic rate-controlled atrial fibrillation and he tolerates oral anticoagulation with Eliquis. He has an increased heart rate in the setting of his acute illness. I agree that a good strategy is to add IV metoprolol as a scheduled drug and follow his blood pressure and heart rate closely. Would continue IV amiodarone and once he is better rate controlled and recovering from his illness, this can probably be stopped. Agree that his depressed LVEF is likely related to very rapid heart rate and/or tachycardia mediated cardiomyopathy. Heart rate control is the appropriate treatment and a follow-up echocardiogram should be done in about 3 months. Will follow with you.  Sherren Mocha, M.D. 11/02/2015 6:34 PM  ]

## 2015-11-02 NOTE — Progress Notes (Signed)
eLink Physician-Brief Progress Note Patient Name: Patrick Durham DOB: 06-04-1951 MRN: [id number]   Date of Service  11/02/2015  HPI/Events of Note  Camera check on patient with respiratory failure. Patient sleeping on his left side. Currently not on CPAP or BiPAP. Reportedly uses CPAP at home but review of respiratory therapy note shows patient refused CPAP for tonight. Vital signs appear stable. No  Increased work of breathing.  eICU Interventions  Continue close monitoring in the intensive care unit overnight.     Intervention Category Major Interventions: Respiratory failure - evaluation and management  Tera Partridge 11/02/2015, 1:55 AM

## 2015-11-02 NOTE — Progress Notes (Signed)
eLink Physician-Brief Progress Note Patient Name: Patrick Durham DOB: 06/02/1951 MRN: [id number]   Date of Service  11/02/2015  HPI/Events of Note  Patient requests "something" for anxiety.  eICU Interventions  Will order Xanax 0.25 mg PO now.     Intervention Category Minor Interventions: Agitation / anxiety - evaluation and management  Klyde Banka Eugene 11/02/2015, 11:52 PM

## 2015-11-02 NOTE — Progress Notes (Signed)
CPAP set up at bedside for patient. Patient states that he will place it on when he is ready. RT will continue to monitor.

## 2015-11-02 NOTE — Progress Notes (Signed)
  Echocardiogram 2D Echocardiogram has been performed.  Patrick Durham 11/02/2015, 9:32 AM

## 2015-11-03 ENCOUNTER — Inpatient Hospital Stay (HOSPITAL_COMMUNITY): Payer: Medicare Other

## 2015-11-03 DIAGNOSIS — I4891 Unspecified atrial fibrillation: Secondary | ICD-10-CM | POA: Diagnosis present

## 2015-11-03 DIAGNOSIS — J449 Chronic obstructive pulmonary disease, unspecified: Secondary | ICD-10-CM | POA: Diagnosis present

## 2015-11-03 DIAGNOSIS — R6521 Severe sepsis with septic shock: Secondary | ICD-10-CM

## 2015-11-03 DIAGNOSIS — G4733 Obstructive sleep apnea (adult) (pediatric): Secondary | ICD-10-CM | POA: Diagnosis present

## 2015-11-03 DIAGNOSIS — A419 Sepsis, unspecified organism: Secondary | ICD-10-CM | POA: Diagnosis present

## 2015-11-03 DIAGNOSIS — J42 Unspecified chronic bronchitis: Secondary | ICD-10-CM

## 2015-11-03 DIAGNOSIS — I5043 Acute on chronic combined systolic (congestive) and diastolic (congestive) heart failure: Secondary | ICD-10-CM | POA: Diagnosis present

## 2015-11-03 DIAGNOSIS — N179 Acute kidney failure, unspecified: Secondary | ICD-10-CM | POA: Diagnosis present

## 2015-11-03 LAB — BASIC METABOLIC PANEL
ANION GAP: 5 (ref 5–15)
BUN: 17 mg/dL (ref 6–20)
CHLORIDE: 106 mmol/L (ref 101–111)
CO2: 25 mmol/L (ref 22–32)
Calcium: 7.9 mg/dL — ABNORMAL LOW (ref 8.9–10.3)
Creatinine, Ser: 1.06 mg/dL (ref 0.61–1.24)
GFR calc Af Amer: >60 mL/min (ref >60)
GLUCOSE: 107 mg/dL — AB (ref 65–99)
POTASSIUM: 3.4 mmol/L — AB (ref 3.5–5.1)
Sodium: 136 mmol/L (ref 135–145)

## 2015-11-03 LAB — CBC
HEMATOCRIT: 32.4 % — AB (ref 39.0–52.0)
HEMOGLOBIN: 10.8 g/dL — AB (ref 13.0–17.0)
MCH: 33.2 pg (ref 26.0–34.0)
MCHC: 33.3 g/dL (ref 30.0–36.0)
MCV: 99.7 fL (ref 78.0–100.0)
PLATELETS: 124 10*3/uL — AB (ref 150–400)
RBC: 3.25 MIL/uL — AB (ref 4.22–5.81)
RDW: 14 % (ref 11.5–15.5)
WBC: 12.8 10*3/uL — AB (ref 4.0–10.5)

## 2015-11-03 LAB — RESPIRATORY VIRUS PANEL
ADENOVIRUS: NEGATIVE
Influenza A: NEGATIVE
Influenza B: NEGATIVE
Metapneumovirus: NEGATIVE
PARAINFLUENZA 1 A: NEGATIVE
Parainfluenza 2: NEGATIVE
Parainfluenza 3: NEGATIVE
RESPIRATORY SYNCYTIAL VIRUS A: NEGATIVE
RESPIRATORY SYNCYTIAL VIRUS B: NEGATIVE
RHINOVIRUS: POSITIVE — AB

## 2015-11-03 LAB — PROCALCITONIN: Procalcitonin: 14.4 ng/mL

## 2015-11-03 MED ORDER — METOPROLOL TARTRATE 25 MG PO TABS
25.0000 mg | ORAL_TABLET | Freq: Three times a day (TID) | ORAL | Status: DC
  Administered 2015-11-03 – 2015-11-04 (×3): 25 mg via ORAL
  Filled 2015-11-03 (×3): qty 1

## 2015-11-03 MED ORDER — LORAZEPAM 1 MG PO TABS
1.0000 mg | ORAL_TABLET | Freq: Every evening | ORAL | Status: DC | PRN
  Administered 2015-11-03 – 2015-11-04 (×2): 1 mg via ORAL
  Filled 2015-11-03 (×2): qty 1

## 2015-11-03 MED ORDER — AMIODARONE HCL 200 MG PO TABS
400.0000 mg | ORAL_TABLET | Freq: Two times a day (BID) | ORAL | Status: DC
  Administered 2015-11-03 – 2015-11-04 (×3): 400 mg via ORAL
  Filled 2015-11-03 (×3): qty 2

## 2015-11-03 NOTE — Clinical Documentation Improvement (Signed)
Critical Care  Can the diagnosis of Shock be further specified noted in H&P? Please document response in next progress note. Thank you!   Shock, including Type:  Septic, Cardiogenic, Hyper/Hypoglycemic, Hypovolemic, Hemorrhagic, Neurogenic, Anaphylactic, Other type, including suspected or known cause and/or associated condition(s)  Other  Clinically Undetermined  Document any associated diagnoses/conditions.  Supporting Information:  "Shock? > again, due to afib with RVR, use neosynephrine and treat afib and hypoxemia"   "Hypotension and elevated Lactic Acid. BP = 80/50. Lactic Acid 5.02 >> 5.81. Has had 3 liters of crystalloid. Pro-BNP = 977" 3/20 Elink note  Norepinephrine initiated  Please exercise your independent, professional judgment when responding. A specific answer is not anticipated or expected.  Thank You,  Zoila Shutter RN, BSN, [place] [phone]; Cell: [phone]

## 2015-11-03 NOTE — Progress Notes (Signed)
    Subjective:  No chest pain. Breathing is slowly improving.  Objective:  Vital Signs in the last 24 hours: Temp:  [98 F (36.7 C)-98.5 F (36.9 C)] 98 F (36.7 C) (03/22 1400) Pulse Rate:  [52-142] 52 (03/22 1400) Resp:  [17-27] 17 (03/22 1400) BP: (94-137)/(71-83) 116/75 mmHg (03/22 1400) SpO2:  [95 %-98 %] 98 % (03/22 1400) Weight:  [103 lb 9.9 oz (47 kg)-209 lb 4.8 oz (94.938 kg)] 209 lb 4.8 oz (94.938 kg) (03/22 0517)  Intake/Output from previous day: 03/21 0701 - 03/22 0700 In: 343.7 [I.V.:293.7; IV Piggyback:50] Out: 620 [Urine:620]  Physical Exam: Pt is alert and oriented, NAD HEENT: normal Neck: JVP - normal Lungs: Scattered rhonchi bilaterally CV: Irregularly irregular without murmur or gallop Abd: soft, NT, Positive BS, no hepatomegaly Ext: no C/C/E, distal pulses intact and equal Skin: warm/dry no rash  Lab Results:  Recent Labs  11/02/15 0230 11/03/15 0340  WBC 17.4* 12.8*  HGB 11.4* 10.8*  PLT 110* 124*    Recent Labs  11/02/15 0230 11/03/15 0340  NA 138 136  K 3.7 3.4*  CL 107 106  CO2 22 25  GLUCOSE 126* 107*  BUN 24* 17  CREATININE 1.25* 1.06    Recent Labs  11/01/15 2100 11/02/15 0230  TROPONINI 0.05* 0.05*    Tele: Personally reviewed: Atrial fibrillation heart rate 90s  Assessment/Plan:  Atrial fibrillation with RVR: The patient is improving as his general illness is improving. Continue Eliquis for anticoagulation. Will change to oral amiodarone and metoprolol and discontinue IV amiodarone. Hopefully we can keep his heart rate controlled with oral agents. Will continue to follow with you.   Sherren Mocha, M.D. 11/03/2015, 2:41 PM

## 2015-11-03 NOTE — Progress Notes (Signed)
PROGRESS NOTE  Patrick Durham BMW:[id number] DOB: December 10, 1950 DOA: 11/01/2015 PCP: No primary care provider on file. 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 He has a 3 day history of subjective fevers, congestion, productive cough, and pleuritic chest pain from coughing. He presented to his PCP with these complaints 3/20 and was noted to be hypotensive there. He was also in AF-RVR. He was admitted to ICU and TRH took over 3/22  Assessment & Plan: Active Problems:   Severe sepsis (HCC)   AKI (acute kidney injury) ([place])   Septic shock (HCC)   COPD (chronic obstructive pulmonary disease) (HCC)   OSA (obstructive sleep apnea)   Atrial fibrillation with RVR (HCC)   Acute on chronic combined systolic and diastolic heart failure (HCC)   Sepsis with septic shock on admission - briefly on pressors, now off - sepsis physiology improving - WBC down to 12, procalcitonin improving - lactic acid normalized - blood cultures 1/2 bottles with GPC in chains. Awaiting speciation.   Acute hypoxemic respiratory failure secondary to CAP vs Viral pneumonitis vs Pulmonary edema.  - improved, now on room air - continue antibiotics for CAP  COPD  - without acute exacerbation  OSA on CPAP, on at night  History of aspergillosis PNA  Atrial fibrillation with RVR,  - suspect this is respiratory mediated - cardiology following, on amiodarone, BB - continue Eliquis  Acute on chronic combined CHF - 2D echo with EF 40-45% - suspect due to rapid A fib  AKI - due to shock - renal function improving   DVT prophylaxis: Eliquis Code Status: Full Family Communication: no family bedside Disposition Plan: home when  ready Barriers for discharge: A fib  Consultants:   Cardiology   Procedures:   2D echo: EF 40-45%  Antimicrobials:  Ceftriaxone 3/20 >>  Azithromycin 3/20 >>   Subjective: No complaints, denies chest pain, denies dyspnea.   Objective: Filed Vitals:   11/02/15 2144 11/03/15 0517 11/03/15 0905 11/03/15 1233  BP:  113/81  132/73  Pulse:  57    Temp:  98.5 F (36.9 C)    TempSrc:  Oral    Resp: 18 18    Height:      Weight:  94.938 kg (209 lb 4.8 oz)    SpO2: 95% 96% 96%     Intake/Output Summary (Last 24 hours) at 11/03/15 1312 Last data filed at 11/03/15 0518  Gross per 24 hour  Intake  133.5 ml  Output    620 ml  Net -486.5 ml   Filed Weights   11/02/15 0321 11/02/15 1856 11/03/15 0517  Weight: 95.6 kg (210 lb 12.2 oz) 47 kg (103 lb 9.9 oz) 94.938 kg (209 lb 4.8 oz)    Examination: BP 132/73 mmHg  Pulse 57  Temp(Src) 98.5 F (36.9 C) (Oral)  Resp 18  Ht '5\' 10"'$  (1.778 m)  Wt 94.938 kg (209 lb 4.8 oz)  BMI 30.03 kg/m2  SpO2 96%  GENERAL: NAD  HEENT: head NCAT, no scleral icterus. Pupils round and reactive.  NECK: Supple. No carotid bruits.  LUNGS: Clear to auscultation. No wheezing or crackles  HEART: irregular, tachycardic. 2+ pulses  ABDOMEN: Soft, nontender, and nondistended. Positive bowel sounds.   EXTREMITIES: Without  any cyanosis or clubbing  NEUROLOGIC: Alert and oriented x3. Cranial nerves II through XII are grossly intact. Strength 5/5 in all 4.   Data Reviewed: I have personally reviewed following labs and imaging studies  CBC:  Recent Labs Lab 11/01/15 1214 11/02/15 0230 11/03/15 0340  WBC 22.5* 17.4* 12.8*  NEUTROABS 18.7*  --   --   HGB 12.6* 11.4* 10.8*  HCT 37.8* 34.0* 32.4*  MCV 101.6* 100.6* 99.7  PLT 118* 110* 161*   Basic Metabolic Panel:  Recent Labs Lab 11/01/15 1214 11/02/15 0230 11/03/15 0340  NA 138 138 136  K 4.5 3.7 3.4*  CL 104 107 106  CO2 19* 22 25  GLUCOSE 151* 126* 107*  BUN 20 24* 17   CREATININE 1.74* 1.25* 1.06  CALCIUM 8.6* 7.8* 7.9*  MG  --  1.5*  --   PHOS  --  2.2*  --    GFR: Estimated Creatinine Clearance: 80.4 mL/min (by C-G formula based on Cr of 1.06). Liver Function Tests:  Recent Labs Lab 11/01/15 1214  AST 55*  ALT 50  ALKPHOS 61  BILITOT 1.7*  PROT 7.1  ALBUMIN 2.9*   Coagulation Profile:  Recent Labs Lab 11/01/15 1533  INR 1.60*   Cardiac Enzymes:  Recent Labs Lab 11/01/15 1533 11/01/15 2100 11/02/15 0230  TROPONINI 0.11* 0.05* 0.05*   CBG: Urine analysis:    Component Value Date/Time   COLORURINE AMBER* 11/01/2015 1305   APPEARANCEUR HAZY* 11/01/2015 1305   LABSPEC 1.024 11/01/2015 1305   PHURINE 5.0 11/01/2015 1305   GLUCOSEU NEGATIVE 11/01/2015 1305   HGBUR LARGE* 11/01/2015 1305   BILIRUBINUR SMALL* 11/01/2015 1305   KETONESUR 15* 11/01/2015 1305   PROTEINUR 100* 11/01/2015 1305   NITRITE POSITIVE* 11/01/2015 1305   LEUKOCYTESUR SMALL* 11/01/2015 1305   Sepsis Labs: Invalid input(s): PROCALCITONIN, LACTICIDVEN  Recent Results (from the past 240 hour(s))  Blood Culture (routine x 2)     Status: None (Preliminary result)   Collection Time: 11/01/15 12:01 PM  Result Value Ref Range Status   Specimen Description BLOOD LEFT HAND  Final   Special Requests BOTTLES DRAWN AEROBIC AND ANAEROBIC 5CC  Final   Culture  Setup Time   Final    GRAM POSITIVE COCCI IN CHAINS AEROBIC BOTTLE ONLY CRITICAL RESULT CALLED TO, READ BACK BY AND VERIFIED WITH: D HOVANDER,RN '@0553'$  11/02/15 MKELLY    Culture GRAM POSITIVE COCCI  Final   Report Status PENDING  Incomplete  Blood Culture (routine x 2)     Status: None (Preliminary result)   Collection Time: 11/01/15 12:19 PM  Result Value Ref Range Status   Specimen Description BLOOD RIGHT HAND  Final   Special Requests BOTTLES DRAWN AEROBIC ONLY 4CC  Final   Culture NO GROWTH 1 DAY  Final   Report Status PENDING  Incomplete  Urine culture     Status: None   Collection Time:  11/01/15  1:05 PM  Result Value Ref Range Status   Specimen Description URINE, RANDOM  Final   Special Requests NONE  Final   Culture NO GROWTH 1 DAY  Final   Report Status 11/02/2015 FINAL  Final  MRSA PCR Screening     Status: None   Collection Time: 11/01/15  7:09 PM  Result Value Ref Range Status   MRSA by PCR NEGATIVE NEGATIVE Final    Comment:        The GeneXpert MRSA Assay (FDA approved for NASAL specimens only), is one component of a  comprehensive MRSA colonization surveillance program. It is not intended to diagnose MRSA infection nor to guide or monitor treatment for MRSA infections.       Radiology Studies: Dg Chest Port 1 View  11/03/2015  CLINICAL DATA:  Respiratory failure, severe sepsis, atrial fibrillation EXAM: PORTABLE CHEST 1 VIEW COMPARISON:  Portable chest x-ray of November 02, 2015 FINDINGS: The lungs remain hyperinflated with diffusely increased interstitial markings. There are are stable coarse infrahilar lung markings on the left with partial obscuration of the hemidiaphragm. The heart is top-normal in size. The pulmonary vascularity is not engorged. There is no significant pleural effusion there is no pneumothorax. The mediastinum is normal in width. The observed bony thorax exhibits no acute abnormality. IMPRESSION: COPD. Mild bilateral edema of cardiac or noncardiac cause. Left lower lobe atelectasis or pneumonia. Overall there has not been significant interval change since yesterday's study. Electronically Signed   By: David  Martinique M.D.   On: 11/03/2015 08:04   Dg Chest Port 1 View  11/02/2015  CLINICAL DATA:  Respiratory failure, sepsis, former smoker ; leukemia. EXAM: PORTABLE CHEST 1 VIEW COMPARISON:  Portable chest x-ray of November 01, 2015 FINDINGS: The lungs are adequately inflated. The interstitial markings remain increased bilaterally. The left hemidiaphragm remains partially obscured but the right hemidiaphragm is better demonstrated. The cardiac  silhouette is mildly enlarged. The pulmonary vascularity is not engorged. IMPRESSION: Slight interval improvement in the appearance of the lung bases especially on the right. This may indicate resolving atelectasis or infiltrate. Stable mild cardiac enlargement without significant pulmonary vascular congestion. A follow-up PA and lateral chest x-ray would be useful. Electronically Signed   By: David  Martinique M.D.   On: 11/02/2015 07:35   Dg Chest Portable 1 View  11/01/2015  CLINICAL DATA:  Shortness of breath with fever and cough EXAM: PORTABLE CHEST 1 VIEW COMPARISON:  Film from earlier in the same day FINDINGS: Cardiac shadow is stable. The lungs are well aerated bilaterally. Persistent increased density is noted in the left lung base stable from the previous exam. No new focal abnormality is seen. IMPRESSION: No significant change from the prior study. Electronically Signed   By: Inez Catalina M.D.   On: 11/01/2015 14:26     Scheduled Meds: . apixaban  5 mg Oral BID  . azithromycin  500 mg Oral Daily  . cefTRIAXone (ROCEPHIN)  IV  2 g Intravenous Q24H  . fluticasone  1 spray Each Nare Daily  . ipratropium  0.5 mg Nebulization Q6H  . levalbuterol  0.63 mg Nebulization Q6H  . loratadine  10 mg Oral Daily  . metoprolol  5 mg Intravenous 4 times per day  . montelukast  10 mg Oral QHS  . pantoprazole  40 mg Oral QHS  . sodium chloride flush  3 mL Intravenous Q12H   Continuous Infusions: . amiodarone 30 mg/hr (11/03/15 0309)    Marzetta Board, MD, PhD [facility] Pager [phone]  If 7PM-7AM, please contact night-coverage [email or web address] Password [email or web address] 11/03/2015, 1:12 PM

## 2015-11-04 LAB — PROCALCITONIN: PROCALCITONIN: 7.51 ng/mL

## 2015-11-04 MED ORDER — LEVALBUTEROL HCL 0.63 MG/3ML IN NEBU: 0.6300 mg | INHALATION_SOLUTION | Freq: Four times a day (QID) | RESPIRATORY_TRACT | Status: DC | PRN

## 2015-11-04 MED ORDER — METOPROLOL TARTRATE 50 MG PO TABS
50.0000 mg | ORAL_TABLET | Freq: Two times a day (BID) | ORAL | Status: DC
  Administered 2015-11-04 – 2015-11-05 (×2): 50 mg via ORAL
  Filled 2015-11-04 (×2): qty 1

## 2015-11-04 MED ORDER — LEVALBUTEROL HCL 0.63 MG/3ML IN NEBU
0.6300 mg | INHALATION_SOLUTION | Freq: Three times a day (TID) | RESPIRATORY_TRACT | Status: DC
  Administered 2015-11-04 – 2015-11-05 (×3): 0.63 mg via RESPIRATORY_TRACT
  Filled 2015-11-04 (×3): qty 3

## 2015-11-04 MED ORDER — METOPROLOL TARTRATE 25 MG PO TABS
25.0000 mg | ORAL_TABLET | Freq: Once | ORAL | Status: AC
  Administered 2015-11-04: 25 mg via ORAL
  Filled 2015-11-04: qty 1

## 2015-11-04 MED ORDER — IPRATROPIUM BROMIDE 0.02 % IN SOLN
0.5000 mg | Freq: Three times a day (TID) | RESPIRATORY_TRACT | Status: DC
  Administered 2015-11-04 – 2015-11-05 (×3): 0.5 mg via RESPIRATORY_TRACT
  Filled 2015-11-04 (×3): qty 2.5

## 2015-11-04 NOTE — Progress Notes (Signed)
PROGRESS NOTE  Patrick Durham OVF:[id number] DOB: May 16, 1951 DOA: 11/01/2015 PCP: No primary care provider on file. 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 He has a 3 day history of subjective fevers, congestion, productive cough, and pleuritic chest pain from coughing. He presented to his PCP with these complaints 3/20 and was noted to be hypotensive there. He was also in AF-RVR. He was admitted to ICU and TRH took over 3/22  Assessment & Plan: Active Problems:   Severe sepsis (HCC)   AKI (acute kidney injury) ([place])   Septic shock (HCC)   COPD (chronic obstructive pulmonary disease) (HCC)   OSA (obstructive sleep apnea)   Atrial fibrillation with RVR (HCC)   Acute on chronic combined systolic and diastolic heart failure (HCC)   Sepsis with septic shock on admission - briefly on pressors, now off - sepsis physiology improving - WBC improving, procalcitonin improving - lactic acid normalized - blood cultures 1/2 bottles with GPC in chains. Awaiting speciation. Discussed with micro, not speciated yet.  Acute hypoxemic respiratory failure secondary to CAP vs Viral pneumonitis vs Pulmonary edema.  - improved, now on room air - continue antibiotics for CAP with Ceftriaxone and Azithromycin - respiratory panel positive for rhinovirus  COPD  - without acute exacerbation  OSA on CPAP, on at night  History of aspergillosis PNA  Atrial fibrillation with RVR,  - suspect this is respiratory mediated - cardiology following, continue metoprolol. Discontinue Amiodarone. - continue Eliquis  Acute on chronic combined CHF - 2D echo with EF 40-45% - suspect due to rapid A fib  AKI - due to shock - renal  function improving   DVT prophylaxis: Eliquis Code Status: Full Family Communication: no family bedside Disposition Plan: home when ready Barriers for discharge: positive blood cultures  Consultants:   Cardiology   Procedures:   2D echo: EF 40-45%  Antimicrobials:  Ceftriaxone 3/20 >>  Azithromycin 3/20 >>   Subjective: No complaints, denies chest pain, denies dyspnea. Asks about going home  Objective: Filed Vitals:   11/03/15 1934 11/03/15 2005 11/04/15 0640 11/04/15 0740  BP: 115/63  127/82   Pulse: 92 78 104   Temp: 98.3 F (36.8 C)  98.9 F (37.2 C)   TempSrc: Oral  Oral   Resp: '16 16 17   '$ Height:      Weight:   94.756 kg (208 lb 14.4 oz)   SpO2: 99% 96% 96% 96%    Intake/Output Summary (Last 24 hours) at 11/04/15 1117 Last data filed at 11/04/15 1000  Gross per 24 hour  Intake    480 ml  Output    400 ml  Net     80 ml   Filed Weights   11/02/15 1856 11/03/15 0517 11/04/15 0640  Weight: 47 kg (103 lb 9.9 oz) 94.938 kg (209 lb 4.8 oz) 94.756 kg (208 lb 14.4 oz)    Examination: BP 127/82 mmHg  Pulse 104  Temp(Src) 98.9 F (37.2 C) (Oral)  Resp 17  Ht '5\' 10"'$  (1.778 m)  Wt 94.756 kg (208 lb 14.4 oz)  BMI 29.97 kg/m2  SpO2 96%  GENERAL: NAD  HEENT: head NCAT, no scleral icterus. Pupils round and reactive.  NECK: Supple. No carotid bruits.  LUNGS: Clear to auscultation. No wheezing or crackles  HEART: irregular, tachycardic. 2+ pulses  ABDOMEN: Soft, nontender, and nondistended. Positive bowel sounds.   EXTREMITIES: Without any cyanosis or clubbing  NEUROLOGIC: Alert and oriented x3. Cranial nerves II through XII are grossly intact. Strength 5/5 in all 4.   Data Reviewed: I have personally reviewed following labs and imaging studies  CBC:  Recent Labs Lab 11/01/15 1214 11/02/15 0230 11/03/15 0340  WBC 22.5* 17.4* 12.8*  NEUTROABS 18.7*  --   --   HGB 12.6* 11.4* 10.8*  HCT 37.8* 34.0* 32.4*  MCV 101.6* 100.6* 99.7  PLT 118*  110* 509*   Basic Metabolic Panel:  Recent Labs Lab 11/01/15 1214 11/02/15 0230 11/03/15 0340  NA 138 138 136  K 4.5 3.7 3.4*  CL 104 107 106  CO2 19* 22 25  GLUCOSE 151* 126* 107*  BUN 20 24* 17  CREATININE 1.74* 1.25* 1.06  CALCIUM 8.6* 7.8* 7.9*  MG  --  1.5*  --   PHOS  --  2.2*  --    GFR: Estimated Creatinine Clearance: 80.3 mL/min (by C-G formula based on Cr of 1.06). Liver Function Tests:  Recent Labs Lab 11/01/15 1214  AST 55*  ALT 50  ALKPHOS 61  BILITOT 1.7*  PROT 7.1  ALBUMIN 2.9*   Coagulation Profile:  Recent Labs Lab 11/01/15 1533  INR 1.60*   Cardiac Enzymes:  Recent Labs Lab 11/01/15 1533 11/01/15 2100 11/02/15 0230  TROPONINI 0.11* 0.05* 0.05*   CBG: Urine analysis:    Component Value Date/Time   COLORURINE AMBER* 11/01/2015 1305   APPEARANCEUR HAZY* 11/01/2015 1305   LABSPEC 1.024 11/01/2015 1305   PHURINE 5.0 11/01/2015 1305   GLUCOSEU NEGATIVE 11/01/2015 1305   HGBUR LARGE* 11/01/2015 1305   BILIRUBINUR SMALL* 11/01/2015 1305   KETONESUR 15* 11/01/2015 1305   PROTEINUR 100* 11/01/2015 1305   NITRITE POSITIVE* 11/01/2015 1305   LEUKOCYTESUR SMALL* 11/01/2015 1305   Sepsis Labs: Invalid input(s): PROCALCITONIN, LACTICIDVEN  Recent Results (from the past 240 hour(s))  Blood Culture (routine x 2)     Status: None (Preliminary result)   Collection Time: 11/01/15 12:01 PM  Result Value Ref Range Status   Specimen Description BLOOD LEFT HAND  Final   Special Requests BOTTLES DRAWN AEROBIC AND ANAEROBIC 5CC  Final   Culture  Setup Time   Final    GRAM POSITIVE COCCI IN CHAINS AEROBIC BOTTLE ONLY CRITICAL RESULT CALLED TO, READ BACK BY AND VERIFIED WITH: D HOVANDER,RN '@0553'$  11/02/15 MKELLY    Culture   Final    GRAM POSITIVE COCCI IDENTIFICATION AND SUSCEPTIBILITIES TO FOLLOW    Report Status PENDING  Incomplete  Respiratory virus panel     Status: Abnormal   Collection Time: 11/01/15 12:01 PM  Result Value Ref Range  Status   Respiratory Syncytial Virus A Negative Negative Final   Respiratory Syncytial Virus B Negative Negative Final   Influenza A Negative Negative Final   Influenza B Negative Negative Final   Parainfluenza 1 Negative Negative Final   Parainfluenza 2 Negative Negative Final   Parainfluenza 3 Negative Negative Final   Metapneumovirus Negative Negative Final   Rhinovirus Positive (A) Negative Final   Adenovirus Negative Negative Final    Comment: (NOTE) Performed At: [facility] [address] [place], [place] [id number] Lindon Romp MD KD:[id number]   Blood Culture (routine x 2)     Status: None (Preliminary result)   Collection Time: 11/01/15 12:19 PM  Result Value Ref Range Status   Specimen Description BLOOD RIGHT HAND  Final   Special Requests BOTTLES DRAWN AEROBIC ONLY 4CC  Final   Culture NO GROWTH 2 DAYS  Final   Report Status PENDING  Incomplete  Urine culture     Status: None   Collection Time: 11/01/15  1:05 PM  Result Value Ref Range Status   Specimen Description URINE, RANDOM  Final   Special Requests NONE  Final   Culture NO GROWTH 1 DAY  Final   Report Status 11/02/2015 FINAL  Final  MRSA PCR Screening     Status: None   Collection Time: 11/01/15  7:09 PM  Result Value Ref Range Status   MRSA by PCR NEGATIVE NEGATIVE Final    Comment:        The GeneXpert MRSA Assay (FDA approved for NASAL specimens only), is one component of a comprehensive MRSA colonization surveillance program. It is not intended to diagnose MRSA infection nor to guide or monitor treatment for MRSA infections.       Radiology Studies: Dg Chest Port 1 View  11/03/2015  CLINICAL DATA:  Respiratory failure, severe sepsis, atrial fibrillation EXAM: PORTABLE CHEST 1 VIEW COMPARISON:  Portable chest x-ray of November 02, 2015 FINDINGS: The lungs remain hyperinflated with diffusely increased interstitial markings. There are are stable coarse infrahilar lung markings on the left  with partial obscuration of the hemidiaphragm. The heart is top-normal in size. The pulmonary vascularity is not engorged. There is no significant pleural effusion there is no pneumothorax. The mediastinum is normal in width. The observed bony thorax exhibits no acute abnormality. IMPRESSION: COPD. Mild bilateral edema of cardiac or noncardiac cause. Left lower lobe atelectasis or pneumonia. Overall there has not been significant interval change since yesterday's study. Electronically Signed   By: David  Martinique M.D.   On: 11/03/2015 08:04     Scheduled Meds: . apixaban  5 mg Oral BID  . azithromycin  500 mg Oral Daily  . cefTRIAXone (ROCEPHIN)  IV  2 g Intravenous Q24H  . fluticasone  1 spray Each Nare Daily  . ipratropium  0.5 mg Nebulization TID  . levalbuterol  0.63 mg Nebulization TID  . loratadine  10 mg Oral Daily  . metoprolol tartrate  50 mg Oral BID  . montelukast  10 mg Oral QHS  . pantoprazole  40 mg Oral QHS  . sodium chloride flush  3 mL Intravenous Q12H   Continuous Infusions:    Marzetta Board, MD, PhD [facility] Pager [phone]  If 7PM-7AM, please contact night-coverage [email or web address] Password [facility] 11/04/2015, 11:17 AM

## 2015-11-04 NOTE — Progress Notes (Signed)
Patient refusing CPAP at this time stating he does not not our mask and is unable to sleep with it. Informed patient if he changes his mind have RN contact RT.

## 2015-11-04 NOTE — Progress Notes (Signed)
Subjective:  The patient is feeling good today. He is eager to go home. He has no chest pain or shortness of breath at rest. No palpitations.  Objective:  Vital Signs in the last 24 hours: Temp:  [98 F (36.7 C)-98.9 F (37.2 C)] 98.9 F (37.2 C) (03/23 0640) Pulse Rate:  [52-106] 104 (03/23 0640) Resp:  [16-17] 17 (03/23 0640) BP: (115-132)/(63-82) 127/82 mmHg (03/23 0640) SpO2:  [96 %-99 %] 96 % (03/23 0740) Weight:  [208 lb 14.4 oz (94.756 kg)] 208 lb 14.4 oz (94.756 kg) (03/23 0640)  Intake/Output from previous day: 03/22 0701 - 03/23 0700 In: 480 [P.O.:480] Out: 400 [Urine:400]  Physical Exam: Pt is alert and oriented, NAD HEENT: normal Neck: JVP - normal Lungs: Coarse bilaterally CV: Irregularly irregular Abd: soft, NT, Positive BS, no hepatomegaly Ext: no C/C/E, distal pulses intact and equal Skin: warm/dry no rash   Lab Results:  Recent Labs  11/02/15 0230 11/03/15 0340  WBC 17.4* 12.8*  HGB 11.4* 10.8*  PLT 110* 124*    Recent Labs  11/02/15 0230 11/03/15 0340  NA 138 136  K 3.7 3.4*  CL 107 106  CO2 22 25  GLUCOSE 126* 107*  BUN 24* 17  CREATININE 1.25* 1.06    Recent Labs  11/01/15 2100 11/02/15 0230  TROPONINI 0.05* 0.05*    Cardiac Studies: 2D Echo: Study Conclusions  - Left ventricle: The cavity size was normal. Systolic function was  mildly to moderately reduced. The estimated ejection fraction was  in the range of 40% to 45%. Wall motion was normal; there were no  regional wall motion abnormalities. The study was not technically  sufficient to allow evaluation of LV diastolic dysfunction due to  atrial fibrillation. - Aortic valve: There was trivial regurgitation. - Aortic root: The aortic root was normal in size. - Mitral valve: Moderately calcified annulus. Moderately thickened,  moderately calcified leaflets . There was mild regurgitation. - Left atrium: The atrium was moderately dilated. - Right ventricle:  Systolic function was normal. - Right atrium: The atrium was moderately dilated. - Tricuspid valve: There was mild regurgitation. - Pulmonary arteries: Systolic pressure was mildly increased. PA  peak pressure: 37 mm Hg (S). - Inferior vena cava: The vessel was normal in size. - Pericardium, extracardiac: A trivial pericardial effusion was  identified. Features were not consistent with tamponade  physiology.  Impressions:  - Mildly decreased LVEF 40-45%, that might be underestimated  secondary to tachycardia during acquisition.  Tele: Atrial fibrillation heart rate 90-110 bpm.  Assessment/Plan:  1. Atrial fibrillation with RVR: The patient has chronic atrial fibrillation which is been rate controlled. His heart rates were elevated in the setting of acute illness. He is now doing better with a resting heart rate around 100 bpm. I am going to consolidate and increase his metoprolol today to 50 mg twice a day. I think we can stop amiodarone as this is not a good long-term drug for this patient. From a cardiac perspective, he has stable for hospital discharge. His primary cardiologist is in the [place] system and I asked him to call for follow-up in the next few weeks. The patient is tolerating long-term anticoagulation with Eliquis without bleeding problems.  2. Acute systolic heart failure, likely related to atrial fibrillation with RVR and acute respiratory illness. BNP was elevated on admission. Mild LV dysfunction noted, but the echo study was done while the patient had a heart rate of about 150 bpm. I suspect this is  all tachycardia mediated and I would recommend that the patient have a follow-up echocardiogram in about 3 months after he has recovered from his illness.  Please call if any further questions arise.   Sherren Mocha, M.D. 11/04/2015, 9:41 AM

## 2015-11-04 NOTE — Progress Notes (Signed)
Pt does not want to wear CPAP tonight due to he cannot tol the mask provided by the hospital.

## 2015-11-05 DIAGNOSIS — G4733 Obstructive sleep apnea (adult) (pediatric): Secondary | ICD-10-CM

## 2015-11-05 LAB — CULTURE, BLOOD (ROUTINE X 2)

## 2015-11-05 MED ORDER — LEVALBUTEROL HCL 0.63 MG/3ML IN NEBU: 0.6300 mg | INHALATION_SOLUTION | Freq: Three times a day (TID) | RESPIRATORY_TRACT | Status: DC

## 2015-11-05 MED ORDER — AMOXICILLIN-POT CLAVULANATE 875-125 MG PO TABS: 1.0000 | ORAL_TABLET | Freq: Two times a day (BID) | ORAL | Status: DC

## 2015-11-05 MED ORDER — LEVALBUTEROL TARTRATE 45 MCG/ACT IN AERO: 1.0000 | INHALATION_SPRAY | Freq: Four times a day (QID) | RESPIRATORY_TRACT | Status: DC | PRN

## 2015-11-05 MED ORDER — LEVALBUTEROL HCL 0.63 MG/3ML IN NEBU: 0.6300 mg | INHALATION_SOLUTION | Freq: Three times a day (TID) | RESPIRATORY_TRACT | Status: DC | PRN

## 2015-11-05 MED ORDER — METOPROLOL TARTRATE 25 MG PO TABS: 50.0000 mg | ORAL_TABLET | Freq: Two times a day (BID) | ORAL | Status: DC

## 2015-11-05 MED ORDER — IPRATROPIUM BROMIDE HFA 17 MCG/ACT IN AERS: 2.0000 | INHALATION_SPRAY | Freq: Four times a day (QID) | RESPIRATORY_TRACT | Status: DC | PRN

## 2015-11-05 NOTE — Progress Notes (Signed)
Patient discharged home with wife. Educated on medication changes and discharge instructions. Wife and patient both stated that they understood. IV was dc'd and was intact.

## 2015-11-05 NOTE — Discharge Summary (Addendum)
Physician Discharge Summary  Patrick Durham WEX:[id number] DOB: [birth year] DOA: 11/01/2015  PCP: No primary care provider on file.  Admit date: 11/01/2015 Discharge date: 11/05/2015  Time spent: > 30 minutes  Recommendations for Outpatient Follow-up:  1. Follow up with primary care team at [facility]   Discharge Diagnoses:  Active Problems:   Severe sepsis (HCC)   AKI (acute kidney injury) ([place])   Septic shock (HCC)   COPD (chronic obstructive pulmonary disease) (HCC)   OSA (obstructive sleep apnea)   Atrial fibrillation with RVR (HCC)   Acute on chronic combined systolic and diastolic heart failure [facility] [place])  Discharge Condition: stable  Diet recommendation: heart healthy  Filed Weights   11/03/15 0517 11/04/15 0640 11/05/15 0552  Weight: 94.938 kg (209 lb 4.8 oz) 94.756 kg (208 lb 14.4 oz) 94.6 kg (208 lb 8.9 oz)    History of present illness:  See H&P, Labs, Consult and Test reports for all details in brief, patient is a 65 year old male, former ppd smoker with 30 pack year histroy with PMH as below, which includes 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 He has a 3 day history of subjective fevers, congestion, productive cough, and pleuritic chest pain from coughing. He presented to his PCP with these complaints 3/20 and was noted to be hypotensive there  Hospital Course:  Sepsis with septic shock on admission - briefly on pressors, now off, sepsis physiology improving on Ceftriaxone and Azithromycin, WBC improving, procalcitonin improving, lactic acid normalized. Blood cultures shows Strep pneumoniae 1/2 bottles, sensitive to Penicillin. Discussed with ID, will narrow to Augmentin to complete a course.  Acute hypoxemic respiratory failure secondary to CAP vs Viral pneumonitis - improved, now on room air, antibiotics narrowed to Augmentin as above COPD -  without acute exacerbation OSA on CPAP, on at night History of aspergillosis PNA Atrial fibrillation with RVR - suspect this is respiratory mediated, cardiology following, continue metoprolol, continue Eliquis Acute on chronic combined CHF - 2D echo with EF 40-45%, suspect due to rapid A fib. Euvolemic on d/c AKI - due to shock, renal function improving Atrial fibrillation - patient's CHA2DS2-VASc Score for Stroke Risk is 3. On eliquis    Procedures:  2D echo   Consultations:  Cardiology   Discharge Exam: Filed Vitals:   11/04/15 2119 11/05/15 0552 11/05/15 0825 11/05/15 0923  BP: 134/89 146/96  141/90  Pulse: 107 110  104  Temp: 98.6 F (37 C) 98.3 F (36.8 C)    TempSrc: Oral Oral    Resp: 16 18    Height:      Weight:  94.6 kg (208 lb 8.9 oz)    SpO2: 97% 97% 97%     General: NAD Cardiovascular: RRR Respiratory: CTA biL  Discharge Instructions Activity:  As tolerated   Get Medicines reviewed and adjusted: Please take all your medications with you for your next visit with your Primary MD  Please request your Primary MD to go over all hospital tests and procedure/radiological results at the follow up, please ask your Primary MD to get all Hospital records sent to his/her office.  If you experience worsening of your admission symptoms, develop shortness of breath, life threatening emergency, suicidal or homicidal thoughts you must seek medical attention immediately by calling 911 or calling your MD immediately if symptoms less severe.  You must read complete instructions/literature along with all the possible adverse  reactions/side effects for all the Medicines you take and that have been prescribed to you. Take any new Medicines after you have completely understood and accpet all the possible adverse reactions/side effects.   Do not drive when taking Pain medications.   Do not take more than prescribed Pain, Sleep and Anxiety Medications  Special Instructions:  If you have smoked or chewed Tobacco in the last 2 yrs please stop smoking, stop any regular Alcohol and or any Recreational drug use.  Wear Seat belts while driving.  Please note  You were cared for by a hospitalist during your hospital stay. Once you are discharged, your primary care physician will handle any further medical issues. Please note that NO REFILLS for any discharge medications will be authorized once you are discharged, as it is imperative that you return to your primary care physician (or establish a relationship with a primary care physician if you do not have one) for your aftercare needs so that they can reassess your need for medications and monitor your lab values.    Medication List    STOP taking these medications        albuterol 108 (90 Base) MCG/ACT inhaler  Commonly known as:  PROVENTIL HFA;VENTOLIN HFA      TAKE these medications        amoxicillin-clavulanate 875-125 MG tablet  Commonly known as:  AUGMENTIN  Take 1 tablet by mouth 2 (two) times daily.     CALTRATE 600 PLUS-VIT D PO  Take 1 tablet by mouth 2 (two) times daily.     ELIQUIS 5 MG Tabs tablet  Generic drug:  apixaban  Take 5 mg by mouth 2 (two) times daily.     fluticasone 110 MCG/ACT inhaler  Commonly known as:  FLOVENT HFA  Inhale 2 puffs into the lungs 2 (two) times daily.     fluticasone 50 MCG/ACT nasal spray  Commonly known as:  FLONASE  Place 1 spray into both nostrils daily.     ipratropium 17 MCG/ACT inhaler  Commonly known as:  ATROVENT HFA  Inhale 2 puffs into the lungs every 6 (six) hours as needed for wheezing.     levalbuterol 45 MCG/ACT inhaler  Commonly known as:  XOPENEX HFA  Inhale 1-2 puffs into the lungs every 6 (six) hours as needed for wheezing.     loratadine 10 MG tablet  Commonly known as:  CLARITIN  Take 10 mg by mouth daily.     LORazepam 1 MG tablet  Commonly known as:  ATIVAN  Take 1 mg by mouth at bedtime as needed for anxiety.     MAGNESIUM  PO  Take 2 tablets by mouth 2 (two) times daily.     metoprolol tartrate 25 MG tablet  Commonly known as:  LOPRESSOR  Take 2 tablets (50 mg total) by mouth 2 (two) times daily.     montelukast 10 MG tablet  Commonly known as:  SINGULAIR  Take 10 mg by mouth at bedtime.     multivitamin with minerals tablet  Take 1 tablet by mouth daily.     ZANTAC 300 MG tablet  Generic drug:  ranitidine  Take 300 mg by mouth at bedtime.         The results of significant diagnostics from this hospitalization (including imaging, microbiology, ancillary and laboratory) are listed below for reference.    Significant Diagnostic Studies: Dg Chest Port 1 View  11/03/2015  CLINICAL DATA:  Respiratory failure, severe sepsis, atrial fibrillation EXAM:  PORTABLE CHEST 1 VIEW COMPARISON:  Portable chest x-ray of November 02, 2015 FINDINGS: The lungs remain hyperinflated with diffusely increased interstitial markings. There are are stable coarse infrahilar lung markings on the left with partial obscuration of the hemidiaphragm. The heart is top-normal in size. The pulmonary vascularity is not engorged. There is no significant pleural effusion there is no pneumothorax. The mediastinum is normal in width. The observed bony thorax exhibits no acute abnormality. IMPRESSION: COPD. Mild bilateral edema of cardiac or noncardiac cause. Left lower lobe atelectasis or pneumonia. Overall there has not been significant interval change since yesterday's study. Electronically Signed   By: David  Martinique M.D.   On: 11/03/2015 08:04   Dg Chest Port 1 View  11/02/2015  CLINICAL DATA:  Respiratory failure, sepsis, former smoker ; leukemia. EXAM: PORTABLE CHEST 1 VIEW COMPARISON:  Portable chest x-ray of November 01, 2015 FINDINGS: The lungs are adequately inflated. The interstitial markings remain increased bilaterally. The left hemidiaphragm remains partially obscured but the right hemidiaphragm is better demonstrated. The cardiac  silhouette is mildly enlarged. The pulmonary vascularity is not engorged. IMPRESSION: Slight interval improvement in the appearance of the lung bases especially on the right. This may indicate resolving atelectasis or infiltrate. Stable mild cardiac enlargement without significant pulmonary vascular congestion. A follow-up PA and lateral chest x-ray would be useful. Electronically Signed   By: David  Martinique M.D.   On: 11/02/2015 07:35   Dg Chest Portable 1 View  11/01/2015  CLINICAL DATA:  Shortness of breath with fever and cough EXAM: PORTABLE CHEST 1 VIEW COMPARISON:  Film from earlier in the same day FINDINGS: Cardiac shadow is stable. The lungs are well aerated bilaterally. Persistent increased density is noted in the left lung base stable from the previous exam. No new focal abnormality is seen. IMPRESSION: No significant change from the prior study. Electronically Signed   By: Inez Catalina M.D.   On: 11/01/2015 14:26   Dg Chest Port 1 View  11/01/2015  CLINICAL DATA:  Cough and congestion for 3 days. History of atrial fibrillation. EXAM: PORTABLE CHEST 1 VIEW COMPARISON:  None. FINDINGS: 1149 hours. The heart size and mediastinal contours are normal. There is aortic atherosclerosis. Patchy retrocardiac left lower lobe airspace disease may reflect atelectasis or an infiltrate. The right lung is clear. There is no edema, pleural effusion or pneumothorax. The bones appear unremarkable. IMPRESSION: Left lower lobe atelectasis or infiltrate. A lateral view may be helpful when the patient is able. Followup PA and lateral chest X-ray is recommended in 3-4 weeks, following trial of antibiotic therapy if clinically warranted, to ensure resolution and exclude underlying malignancy. Electronically Signed   By: Richardean Sale M.D.   On: 11/01/2015 12:09    Microbiology: Recent Results (from the past 240 hour(s))  Blood Culture (routine x 2)     Status: None (Preliminary result)   Collection Time: 11/01/15  12:01 PM  Result Value Ref Range Status   Specimen Description BLOOD LEFT HAND  Final   Special Requests BOTTLES DRAWN AEROBIC AND ANAEROBIC 5CC  Final   Culture  Setup Time   Final    GRAM POSITIVE COCCI IN CHAINS AEROBIC BOTTLE ONLY CRITICAL RESULT CALLED TO, READ BACK BY AND VERIFIED WITH: D HOVANDER,RN '@[id number]'$  11/02/15 MKELLY    Culture STREPTOCOCCUS PNEUMONIAE  Final   Report Status PENDING  Incomplete   Organism ID, Bacteria STREPTOCOCCUS PNEUMONIAE  Final      Susceptibility   Streptococcus pneumoniae - MIC*  ERYTHROMYCIN >=8 RESISTANT Resistant     LEVOFLOXACIN 0.5 SENSITIVE Sensitive     VANCOMYCIN <=0.12 SENSITIVE Sensitive     PENICILLIN <=0.06 SENSITIVE Sensitive     CEFTRIAXONE <=0.12 SENSITIVE Sensitive     * STREPTOCOCCUS PNEUMONIAE  Respiratory virus panel     Status: Abnormal   Collection Time: 11/01/15 12:01 PM  Result Value Ref Range Status   Respiratory Syncytial Virus A Negative Negative Final   Respiratory Syncytial Virus B Negative Negative Final   Influenza A Negative Negative Final   Influenza B Negative Negative Final   Parainfluenza 1 Negative Negative Final   Parainfluenza 2 Negative Negative Final   Parainfluenza 3 Negative Negative Final   Metapneumovirus Negative Negative Final   Rhinovirus Positive (A) Negative Final   Adenovirus Negative Negative Final    Comment: (NOTE) Performed At: [facility] [place], [place] [id number] Lindon Romp MD VZ:[id number]   Blood Culture (routine x 2)     Status: None (Preliminary result)   Collection Time: 11/01/15 12:19 PM  Result Value Ref Range Status   Specimen Description BLOOD RIGHT HAND  Final   Special Requests BOTTLES DRAWN AEROBIC ONLY 4CC  Final   Culture NO GROWTH 4 DAYS  Final   Report Status PENDING  Incomplete  Urine culture     Status: None   Collection Time: 11/01/15  1:05 PM  Result Value Ref Range Status   Specimen Description URINE, RANDOM  Final   Special  Requests NONE  Final   Culture NO GROWTH 1 DAY  Final   Report Status 11/02/2015 FINAL  Final  MRSA PCR Screening     Status: None   Collection Time: 11/01/15  7:09 PM  Result Value Ref Range Status   MRSA by PCR NEGATIVE NEGATIVE Final    Comment:        The GeneXpert MRSA Assay (FDA approved for NASAL specimens only), is one component of a comprehensive MRSA colonization surveillance program. It is not intended to diagnose MRSA infection nor to guide or monitor treatment for MRSA infections.      Labs: Basic Metabolic Panel:  Recent Labs Lab 11/01/15 1214 11/02/15 0230 11/03/15 0340  NA 138 138 136  K 4.5 3.7 3.4*  CL 104 107 106  CO2 19* 22 25  GLUCOSE 151* 126* 107*  BUN 20 24* 17  CREATININE 1.74* 1.25* 1.06  CALCIUM 8.6* 7.8* 7.9*  MG  --  1.5*  --   PHOS  --  2.2*  --    Liver Function Tests:  Recent Labs Lab 11/01/15 1214  AST 55*  ALT 50  ALKPHOS 61  BILITOT 1.7*  PROT 7.1  ALBUMIN 2.9*   CBC:  Recent Labs Lab 11/01/15 1214 11/02/15 0230 11/03/15 0340  WBC 22.5* 17.4* 12.8*  NEUTROABS 18.7*  --   --   HGB 12.6* 11.4* 10.8*  HCT 37.8* 34.0* 32.4*  MCV 101.6* 100.6* 99.7  PLT 118* 110* 124*   Cardiac Enzymes:  Recent Labs Lab 11/01/15 1533 11/01/15 2100 11/02/15 0230  TROPONINI 0.11* 0.05* 0.05*   BNP: BNP (last 3 results)  Recent Labs  11/01/15 1214  BNP 977.3*   CBG:  Recent Labs Lab 11/01/15 2001 11/02/15 0803  GLUCAP 151* 98       Signed:  GHERGHE, COSTIN  [facility] 11/05/2015, 12:51 PM

## 2015-11-05 NOTE — Care Management Important Message (Signed)
Important Message  Patient Details  Name: Patrick Durham MRN: [id number] Date of Birth: November 27, 1950   Medicare Important Message Given:  Yes    Barb Merino Ponce 11/05/2015, 12:31 PM

## 2015-11-05 NOTE — Discharge Instructions (Signed)
Follow with primary team at [facility] in 5-7 days  Please get a complete blood count and chemistry panel checked by your Primary MD at your next visit, and again as instructed by your Primary MD. Please get your medications reviewed and adjusted by your Primary MD.  Please request your Primary MD to go over all Hospital Tests and Procedure/Radiological results at the follow up, please get all Hospital records sent to your Prim MD by signing hospital release before you go home.  If you had Pneumonia of Lung problems at the Hospital: Please get a 2 view Chest X ray done in 6-8 weeks after hospital discharge or sooner if instructed by your Primary MD.  If you have Congestive Heart Failure: Please call your Cardiologist or Primary MD anytime you have any of the following symptoms:  1) 3 pound weight gain in 24 hours or 5 pounds in 1 week  2) shortness of breath, with or without a dry hacking cough  3) swelling in the hands, feet or stomach  4) if you have to sleep on extra pillows at night in order to breathe  Follow cardiac low salt diet and 1.5 lit/day fluid restriction.  If you have diabetes Accuchecks 4 times/day, Once in AM empty stomach and then before each meal. Log in all results and show them to your primary doctor at your next visit. If any glucose reading is under 80 or above 300 call your primary MD immediately.  If you have Seizure/Convulsions/Epilepsy: Please do not drive, operate heavy machinery, participate in activities at heights or participate in high speed sports until you have seen by Primary MD or a Neurologist and advised to do so again.  If you had Gastrointestinal Bleeding: Please ask your Primary MD to check a complete blood count within one week of discharge or at your next visit. Your endoscopic/colonoscopic biopsies that are pending at the time of discharge, will also need to followed by your Primary MD.  Get Medicines reviewed and adjusted. Please take all your  medications with you for your next visit with your Primary MD  Please request your Primary MD to go over all hospital tests and procedure/radiological results at the follow up, please ask your Primary MD to get all Hospital records sent to his/her office.  If you experience worsening of your admission symptoms, develop shortness of breath, life threatening emergency, suicidal or homicidal thoughts you must seek medical attention immediately by calling 911 or calling your MD immediately  if symptoms less severe.  You must read complete instructions/literature along with all the possible adverse reactions/side effects for all the Medicines you take and that have been prescribed to you. Take any new Medicines after you have completely understood and accpet all the possible adverse reactions/side effects.   Do not drive or operate heavy machinery when taking Pain medications.   Do not take more than prescribed Pain, Sleep and Anxiety Medications  Special Instructions: If you have smoked or chewed Tobacco  in the last 2 yrs please stop smoking, stop any regular Alcohol  and or any Recreational drug use.  Wear Seat belts while driving.  Please note You were cared for by a hospitalist during your hospital stay. If you have any questions about your discharge medications or the care you received while you were in the hospital after you are discharged, you can call the unit and asked to speak with the hospitalist on call if the hospitalist that took care of you is not available.  Once you are discharged, your primary care physician will handle any further medical issues. Please note that NO REFILLS for any discharge medications will be authorized once you are discharged, as it is imperative that you return to your primary care physician (or establish a relationship with a primary care physician if you do not have one) for your aftercare needs so that they can reassess your need for medications and monitor your  lab values.  You can reach the hospitalist office at phone [phone] or fax [phone]   If you do not have a primary care physician, you can call [phone] for a physician referral.  Activity: As tolerated with Full fall precautions use walker/cane & assistance as needed  Diet: heart healthy  Disposition Home

## 2015-11-06 LAB — CULTURE, BLOOD (ROUTINE X 2): CULTURE: NO GROWTH

## 2015-11-10 DIAGNOSIS — D419 Neoplasm of uncertain behavior of unspecified urinary organ: Secondary | ICD-10-CM | POA: Diagnosis not present

## 2015-11-10 DIAGNOSIS — R579 Shock, unspecified: Secondary | ICD-10-CM | POA: Diagnosis not present

## 2015-11-10 DIAGNOSIS — Z6830 Body mass index (BMI) 30.0-30.9, adult: Secondary | ICD-10-CM | POA: Diagnosis not present

## 2015-11-10 DIAGNOSIS — I480 Paroxysmal atrial fibrillation: Secondary | ICD-10-CM | POA: Diagnosis not present

## 2015-11-10 DIAGNOSIS — R6521 Severe sepsis with septic shock: Secondary | ICD-10-CM | POA: Diagnosis not present

## 2015-11-10 DIAGNOSIS — J951 Acute pulmonary insufficiency following thoracic surgery: Secondary | ICD-10-CM | POA: Diagnosis not present

## 2015-11-18 DIAGNOSIS — N289 Disorder of kidney and ureter, unspecified: Secondary | ICD-10-CM | POA: Diagnosis not present

## 2015-11-18 DIAGNOSIS — R748 Abnormal levels of other serum enzymes: Secondary | ICD-10-CM | POA: Diagnosis not present

## 2015-11-18 DIAGNOSIS — C9201 Acute myeloblastic leukemia, in remission: Secondary | ICD-10-CM | POA: Diagnosis not present

## 2015-11-18 DIAGNOSIS — I481 Persistent atrial fibrillation: Secondary | ICD-10-CM | POA: Diagnosis not present

## 2015-11-18 DIAGNOSIS — R740 Nonspecific elevation of levels of transaminase and lactic acid dehydrogenase [LDH]: Secondary | ICD-10-CM | POA: Diagnosis not present

## 2015-11-18 DIAGNOSIS — Z9481 Bone marrow transplant status: Secondary | ICD-10-CM | POA: Diagnosis not present

## 2015-11-18 DIAGNOSIS — Z9484 Stem cells transplant status: Secondary | ICD-10-CM | POA: Diagnosis not present

## 2015-11-18 DIAGNOSIS — I482 Chronic atrial fibrillation: Secondary | ICD-10-CM | POA: Diagnosis not present

## 2015-11-18 DIAGNOSIS — J479 Bronchiectasis, uncomplicated: Secondary | ICD-10-CM | POA: Diagnosis not present

## 2015-11-18 DIAGNOSIS — Z23 Encounter for immunization: Secondary | ICD-10-CM | POA: Diagnosis not present

## 2015-11-18 DIAGNOSIS — R7889 Finding of other specified substances, not normally found in blood: Secondary | ICD-10-CM | POA: Diagnosis not present

## 2015-11-18 DIAGNOSIS — D649 Anemia, unspecified: Secondary | ICD-10-CM | POA: Diagnosis not present

## 2015-11-18 DIAGNOSIS — D89811 Chronic graft-versus-host disease: Secondary | ICD-10-CM | POA: Diagnosis not present

## 2015-11-18 DIAGNOSIS — Z79899 Other long term (current) drug therapy: Secondary | ICD-10-CM | POA: Diagnosis not present

## 2015-11-18 DIAGNOSIS — R0609 Other forms of dyspnea: Secondary | ICD-10-CM | POA: Diagnosis not present

## 2015-11-18 DIAGNOSIS — J449 Chronic obstructive pulmonary disease, unspecified: Secondary | ICD-10-CM | POA: Diagnosis not present

## 2015-12-09 DIAGNOSIS — R918 Other nonspecific abnormal finding of lung field: Secondary | ICD-10-CM | POA: Diagnosis not present

## 2015-12-09 DIAGNOSIS — Z8619 Personal history of other infectious and parasitic diseases: Secondary | ICD-10-CM | POA: Diagnosis not present

## 2015-12-09 DIAGNOSIS — Z87891 Personal history of nicotine dependence: Secondary | ICD-10-CM | POA: Diagnosis not present

## 2015-12-09 DIAGNOSIS — Z7901 Long term (current) use of anticoagulants: Secondary | ICD-10-CM | POA: Diagnosis not present

## 2015-12-09 DIAGNOSIS — I509 Heart failure, unspecified: Secondary | ICD-10-CM | POA: Diagnosis not present

## 2015-12-09 DIAGNOSIS — Z9484 Stem cells transplant status: Secondary | ICD-10-CM | POA: Diagnosis not present

## 2015-12-09 DIAGNOSIS — J154 Pneumonia due to other streptococci: Secondary | ICD-10-CM | POA: Diagnosis not present

## 2015-12-09 DIAGNOSIS — B340 Adenovirus infection, unspecified: Secondary | ICD-10-CM | POA: Diagnosis not present

## 2015-12-09 DIAGNOSIS — C9201 Acute myeloblastic leukemia, in remission: Secondary | ICD-10-CM | POA: Diagnosis not present

## 2015-12-09 DIAGNOSIS — Z9481 Bone marrow transplant status: Secondary | ICD-10-CM | POA: Diagnosis not present

## 2015-12-09 DIAGNOSIS — I4891 Unspecified atrial fibrillation: Secondary | ICD-10-CM | POA: Diagnosis not present

## 2015-12-09 DIAGNOSIS — R7881 Bacteremia: Secondary | ICD-10-CM | POA: Diagnosis not present

## 2015-12-16 DIAGNOSIS — I10 Essential (primary) hypertension: Secondary | ICD-10-CM | POA: Diagnosis not present

## 2015-12-16 DIAGNOSIS — E785 Hyperlipidemia, unspecified: Secondary | ICD-10-CM | POA: Diagnosis not present

## 2015-12-16 DIAGNOSIS — J449 Chronic obstructive pulmonary disease, unspecified: Secondary | ICD-10-CM | POA: Diagnosis not present

## 2015-12-16 DIAGNOSIS — I509 Heart failure, unspecified: Secondary | ICD-10-CM | POA: Diagnosis not present

## 2015-12-16 DIAGNOSIS — I252 Old myocardial infarction: Secondary | ICD-10-CM | POA: Diagnosis not present

## 2015-12-16 DIAGNOSIS — I4891 Unspecified atrial fibrillation: Secondary | ICD-10-CM | POA: Diagnosis not present

## 2015-12-24 DIAGNOSIS — Z6831 Body mass index (BMI) 31.0-31.9, adult: Secondary | ICD-10-CM | POA: Diagnosis not present

## 2015-12-24 DIAGNOSIS — J019 Acute sinusitis, unspecified: Secondary | ICD-10-CM | POA: Diagnosis not present

## 2015-12-30 DIAGNOSIS — D7581 Myelofibrosis: Secondary | ICD-10-CM | POA: Diagnosis not present

## 2015-12-30 DIAGNOSIS — I5020 Unspecified systolic (congestive) heart failure: Secondary | ICD-10-CM | POA: Diagnosis not present

## 2015-12-30 DIAGNOSIS — Z7901 Long term (current) use of anticoagulants: Secondary | ICD-10-CM | POA: Diagnosis not present

## 2015-12-30 DIAGNOSIS — I482 Chronic atrial fibrillation: Secondary | ICD-10-CM | POA: Diagnosis not present

## 2015-12-30 DIAGNOSIS — Z9481 Bone marrow transplant status: Secondary | ICD-10-CM | POA: Diagnosis not present

## 2015-12-30 DIAGNOSIS — C9201 Acute myeloblastic leukemia, in remission: Secondary | ICD-10-CM | POA: Diagnosis not present

## 2015-12-30 DIAGNOSIS — Z9484 Stem cells transplant status: Secondary | ICD-10-CM | POA: Diagnosis not present

## 2016-02-03 DIAGNOSIS — Z7901 Long term (current) use of anticoagulants: Secondary | ICD-10-CM | POA: Diagnosis not present

## 2016-02-03 DIAGNOSIS — Z9484 Stem cells transplant status: Secondary | ICD-10-CM | POA: Diagnosis not present

## 2016-02-03 DIAGNOSIS — C9201 Acute myeloblastic leukemia, in remission: Secondary | ICD-10-CM | POA: Diagnosis not present

## 2016-02-03 DIAGNOSIS — I482 Chronic atrial fibrillation: Secondary | ICD-10-CM | POA: Diagnosis not present

## 2016-02-14 DIAGNOSIS — D89813 Graft-versus-host disease, unspecified: Secondary | ICD-10-CM | POA: Diagnosis not present

## 2016-02-14 DIAGNOSIS — H2513 Age-related nuclear cataract, bilateral: Secondary | ICD-10-CM | POA: Diagnosis not present

## 2016-02-14 DIAGNOSIS — Z7901 Long term (current) use of anticoagulants: Secondary | ICD-10-CM | POA: Diagnosis not present

## 2016-02-14 DIAGNOSIS — H04123 Dry eye syndrome of bilateral lacrimal glands: Secondary | ICD-10-CM | POA: Diagnosis not present

## 2016-02-14 DIAGNOSIS — H5713 Ocular pain, bilateral: Secondary | ICD-10-CM | POA: Diagnosis not present

## 2016-02-14 DIAGNOSIS — H53143 Visual discomfort, bilateral: Secondary | ICD-10-CM | POA: Diagnosis not present

## 2016-02-14 DIAGNOSIS — Z87891 Personal history of nicotine dependence: Secondary | ICD-10-CM | POA: Diagnosis not present

## 2016-02-14 DIAGNOSIS — H543 Unqualified visual loss, both eyes: Secondary | ICD-10-CM | POA: Diagnosis not present

## 2016-02-17 DIAGNOSIS — I5020 Unspecified systolic (congestive) heart failure: Secondary | ICD-10-CM | POA: Diagnosis not present

## 2016-02-24 DIAGNOSIS — I4891 Unspecified atrial fibrillation: Secondary | ICD-10-CM | POA: Diagnosis not present

## 2016-02-24 DIAGNOSIS — C92Z1 Other myeloid leukemia, in remission: Secondary | ICD-10-CM | POA: Diagnosis not present

## 2016-02-24 DIAGNOSIS — Z7901 Long term (current) use of anticoagulants: Secondary | ICD-10-CM | POA: Diagnosis not present

## 2016-02-24 DIAGNOSIS — Z886 Allergy status to analgesic agent status: Secondary | ICD-10-CM | POA: Diagnosis not present

## 2016-02-24 DIAGNOSIS — Z79899 Other long term (current) drug therapy: Secondary | ICD-10-CM | POA: Diagnosis not present

## 2016-02-24 DIAGNOSIS — Z9481 Bone marrow transplant status: Secondary | ICD-10-CM | POA: Diagnosis not present

## 2016-02-24 DIAGNOSIS — Z885 Allergy status to narcotic agent status: Secondary | ICD-10-CM | POA: Diagnosis not present

## 2016-02-24 DIAGNOSIS — D89811 Chronic graft-versus-host disease: Secondary | ICD-10-CM | POA: Diagnosis not present

## 2016-02-24 DIAGNOSIS — J449 Chronic obstructive pulmonary disease, unspecified: Secondary | ICD-10-CM | POA: Diagnosis not present

## 2016-02-24 DIAGNOSIS — Z7951 Long term (current) use of inhaled steroids: Secondary | ICD-10-CM | POA: Diagnosis not present

## 2016-02-24 DIAGNOSIS — C9201 Acute myeloblastic leukemia, in remission: Secondary | ICD-10-CM | POA: Diagnosis not present

## 2016-02-24 DIAGNOSIS — R739 Hyperglycemia, unspecified: Secondary | ICD-10-CM | POA: Diagnosis not present

## 2016-02-24 DIAGNOSIS — Z888 Allergy status to other drugs, medicaments and biological substances status: Secondary | ICD-10-CM | POA: Diagnosis not present

## 2016-02-24 DIAGNOSIS — N289 Disorder of kidney and ureter, unspecified: Secondary | ICD-10-CM | POA: Diagnosis not present

## 2016-02-25 DIAGNOSIS — Z9481 Bone marrow transplant status: Secondary | ICD-10-CM | POA: Diagnosis not present

## 2016-02-25 DIAGNOSIS — H903 Sensorineural hearing loss, bilateral: Secondary | ICD-10-CM | POA: Diagnosis not present

## 2016-03-22 DIAGNOSIS — D89813 Graft-versus-host disease, unspecified: Secondary | ICD-10-CM | POA: Diagnosis not present

## 2016-03-22 DIAGNOSIS — C9201 Acute myeloblastic leukemia, in remission: Secondary | ICD-10-CM | POA: Diagnosis not present

## 2016-03-22 DIAGNOSIS — R0609 Other forms of dyspnea: Secondary | ICD-10-CM | POA: Diagnosis not present

## 2016-03-22 DIAGNOSIS — Z87891 Personal history of nicotine dependence: Secondary | ICD-10-CM | POA: Diagnosis not present

## 2016-03-22 DIAGNOSIS — J479 Bronchiectasis, uncomplicated: Secondary | ICD-10-CM | POA: Diagnosis not present

## 2016-03-22 DIAGNOSIS — Z9481 Bone marrow transplant status: Secondary | ICD-10-CM | POA: Diagnosis not present

## 2016-04-04 DIAGNOSIS — H6983 Other specified disorders of Eustachian tube, bilateral: Secondary | ICD-10-CM | POA: Diagnosis not present

## 2016-04-04 DIAGNOSIS — C9201 Acute myeloblastic leukemia, in remission: Secondary | ICD-10-CM | POA: Diagnosis not present

## 2016-04-04 DIAGNOSIS — H9201 Otalgia, right ear: Secondary | ICD-10-CM | POA: Diagnosis not present

## 2016-04-04 DIAGNOSIS — G4733 Obstructive sleep apnea (adult) (pediatric): Secondary | ICD-10-CM | POA: Diagnosis not present

## 2016-04-04 DIAGNOSIS — Z9221 Personal history of antineoplastic chemotherapy: Secondary | ICD-10-CM | POA: Diagnosis not present

## 2016-04-04 DIAGNOSIS — C92Z0 Other myeloid leukemia not having achieved remission: Secondary | ICD-10-CM | POA: Diagnosis not present

## 2016-04-04 DIAGNOSIS — H6901 Patulous Eustachian tube, right ear: Secondary | ICD-10-CM | POA: Diagnosis not present

## 2016-04-04 DIAGNOSIS — Z87891 Personal history of nicotine dependence: Secondary | ICD-10-CM | POA: Diagnosis not present

## 2016-04-04 DIAGNOSIS — H903 Sensorineural hearing loss, bilateral: Secondary | ICD-10-CM | POA: Diagnosis not present

## 2016-04-04 DIAGNOSIS — Z9481 Bone marrow transplant status: Secondary | ICD-10-CM | POA: Diagnosis not present

## 2016-04-20 DIAGNOSIS — J029 Acute pharyngitis, unspecified: Secondary | ICD-10-CM | POA: Diagnosis not present

## 2016-05-02 DIAGNOSIS — H6901 Patulous Eustachian tube, right ear: Secondary | ICD-10-CM | POA: Diagnosis not present

## 2016-05-02 DIAGNOSIS — G4733 Obstructive sleep apnea (adult) (pediatric): Secondary | ICD-10-CM | POA: Diagnosis not present

## 2016-05-02 DIAGNOSIS — H6983 Other specified disorders of Eustachian tube, bilateral: Secondary | ICD-10-CM | POA: Diagnosis not present

## 2016-05-02 DIAGNOSIS — H903 Sensorineural hearing loss, bilateral: Secondary | ICD-10-CM | POA: Diagnosis not present

## 2016-05-02 DIAGNOSIS — H9201 Otalgia, right ear: Secondary | ICD-10-CM | POA: Diagnosis not present

## 2016-05-02 DIAGNOSIS — C9201 Acute myeloblastic leukemia, in remission: Secondary | ICD-10-CM | POA: Diagnosis not present

## 2016-05-02 DIAGNOSIS — C92Z1 Other myeloid leukemia, in remission: Secondary | ICD-10-CM | POA: Diagnosis not present

## 2016-05-11 DIAGNOSIS — D89811 Chronic graft-versus-host disease: Secondary | ICD-10-CM | POA: Diagnosis not present

## 2016-05-11 DIAGNOSIS — Z7901 Long term (current) use of anticoagulants: Secondary | ICD-10-CM | POA: Diagnosis not present

## 2016-05-11 DIAGNOSIS — Z9481 Bone marrow transplant status: Secondary | ICD-10-CM | POA: Diagnosis not present

## 2016-05-11 DIAGNOSIS — C9201 Acute myeloblastic leukemia, in remission: Secondary | ICD-10-CM | POA: Diagnosis not present

## 2016-05-11 DIAGNOSIS — C92Z1 Other myeloid leukemia, in remission: Secondary | ICD-10-CM | POA: Diagnosis not present

## 2016-05-11 DIAGNOSIS — Z886 Allergy status to analgesic agent status: Secondary | ICD-10-CM | POA: Diagnosis not present

## 2016-05-11 DIAGNOSIS — Z7951 Long term (current) use of inhaled steroids: Secondary | ICD-10-CM | POA: Diagnosis not present

## 2016-05-11 DIAGNOSIS — Z79899 Other long term (current) drug therapy: Secondary | ICD-10-CM | POA: Diagnosis not present

## 2016-05-11 DIAGNOSIS — J45909 Unspecified asthma, uncomplicated: Secondary | ICD-10-CM | POA: Diagnosis not present

## 2016-05-11 DIAGNOSIS — Z885 Allergy status to narcotic agent status: Secondary | ICD-10-CM | POA: Diagnosis not present

## 2016-05-11 DIAGNOSIS — I4891 Unspecified atrial fibrillation: Secondary | ICD-10-CM | POA: Diagnosis not present

## 2016-05-11 DIAGNOSIS — J479 Bronchiectasis, uncomplicated: Secondary | ICD-10-CM | POA: Diagnosis not present

## 2016-05-11 DIAGNOSIS — Z888 Allergy status to other drugs, medicaments and biological substances status: Secondary | ICD-10-CM | POA: Diagnosis not present

## 2016-05-11 DIAGNOSIS — I482 Chronic atrial fibrillation: Secondary | ICD-10-CM | POA: Diagnosis not present

## 2016-05-11 DIAGNOSIS — Z881 Allergy status to other antibiotic agents status: Secondary | ICD-10-CM | POA: Diagnosis not present

## 2016-05-19 DIAGNOSIS — Z23 Encounter for immunization: Secondary | ICD-10-CM | POA: Diagnosis not present

## 2016-06-19 DIAGNOSIS — J449 Chronic obstructive pulmonary disease, unspecified: Secondary | ICD-10-CM | POA: Diagnosis not present

## 2016-06-19 DIAGNOSIS — E785 Hyperlipidemia, unspecified: Secondary | ICD-10-CM | POA: Diagnosis not present

## 2016-06-19 DIAGNOSIS — G4733 Obstructive sleep apnea (adult) (pediatric): Secondary | ICD-10-CM | POA: Diagnosis not present

## 2016-06-19 DIAGNOSIS — I4891 Unspecified atrial fibrillation: Secondary | ICD-10-CM | POA: Diagnosis not present

## 2016-06-19 DIAGNOSIS — I10 Essential (primary) hypertension: Secondary | ICD-10-CM | POA: Diagnosis not present

## 2016-06-20 DIAGNOSIS — E875 Hyperkalemia: Secondary | ICD-10-CM | POA: Diagnosis not present

## 2016-07-20 DIAGNOSIS — J018 Other acute sinusitis: Secondary | ICD-10-CM | POA: Diagnosis not present

## 2016-07-20 DIAGNOSIS — J028 Acute pharyngitis due to other specified organisms: Secondary | ICD-10-CM | POA: Diagnosis not present

## 2016-09-07 DIAGNOSIS — D89811 Chronic graft-versus-host disease: Secondary | ICD-10-CM | POA: Diagnosis not present

## 2016-09-07 DIAGNOSIS — Z9481 Bone marrow transplant status: Secondary | ICD-10-CM | POA: Diagnosis not present

## 2016-09-07 DIAGNOSIS — J479 Bronchiectasis, uncomplicated: Secondary | ICD-10-CM | POA: Diagnosis not present

## 2016-09-07 DIAGNOSIS — C9201 Acute myeloblastic leukemia, in remission: Secondary | ICD-10-CM | POA: Diagnosis not present

## 2016-09-07 DIAGNOSIS — I4891 Unspecified atrial fibrillation: Secondary | ICD-10-CM | POA: Diagnosis not present

## 2016-09-07 DIAGNOSIS — N289 Disorder of kidney and ureter, unspecified: Secondary | ICD-10-CM | POA: Diagnosis not present

## 2016-09-13 DIAGNOSIS — H2513 Age-related nuclear cataract, bilateral: Secondary | ICD-10-CM | POA: Diagnosis not present

## 2016-09-13 DIAGNOSIS — H04123 Dry eye syndrome of bilateral lacrimal glands: Secondary | ICD-10-CM | POA: Diagnosis not present

## 2016-11-15 DIAGNOSIS — Z9481 Bone marrow transplant status: Secondary | ICD-10-CM | POA: Diagnosis not present

## 2016-11-15 DIAGNOSIS — R0609 Other forms of dyspnea: Secondary | ICD-10-CM | POA: Diagnosis not present

## 2016-11-15 DIAGNOSIS — J8409 Other alveolar and parieto-alveolar conditions: Secondary | ICD-10-CM | POA: Diagnosis not present

## 2016-11-15 DIAGNOSIS — J479 Bronchiectasis, uncomplicated: Secondary | ICD-10-CM | POA: Diagnosis not present

## 2016-11-15 DIAGNOSIS — Z87891 Personal history of nicotine dependence: Secondary | ICD-10-CM | POA: Diagnosis not present

## 2016-11-15 DIAGNOSIS — Z7901 Long term (current) use of anticoagulants: Secondary | ICD-10-CM | POA: Diagnosis not present

## 2016-11-15 DIAGNOSIS — Z9989 Dependence on other enabling machines and devices: Secondary | ICD-10-CM | POA: Diagnosis not present

## 2016-11-15 DIAGNOSIS — C9201 Acute myeloblastic leukemia, in remission: Secondary | ICD-10-CM | POA: Diagnosis not present

## 2016-11-15 DIAGNOSIS — D89813 Graft-versus-host disease, unspecified: Secondary | ICD-10-CM | POA: Diagnosis not present

## 2016-11-15 DIAGNOSIS — Z7951 Long term (current) use of inhaled steroids: Secondary | ICD-10-CM | POA: Diagnosis not present

## 2016-11-15 DIAGNOSIS — I4891 Unspecified atrial fibrillation: Secondary | ICD-10-CM | POA: Diagnosis not present

## 2016-11-15 DIAGNOSIS — C9200 Acute myeloblastic leukemia, not having achieved remission: Secondary | ICD-10-CM | POA: Diagnosis not present

## 2016-11-15 DIAGNOSIS — Z79899 Other long term (current) drug therapy: Secondary | ICD-10-CM | POA: Diagnosis not present

## 2016-11-15 DIAGNOSIS — G4733 Obstructive sleep apnea (adult) (pediatric): Secondary | ICD-10-CM | POA: Diagnosis not present

## 2016-11-17 DIAGNOSIS — J168 Pneumonia due to other specified infectious organisms: Secondary | ICD-10-CM | POA: Diagnosis not present

## 2016-11-23 DIAGNOSIS — J479 Bronchiectasis, uncomplicated: Secondary | ICD-10-CM | POA: Diagnosis not present

## 2016-11-23 DIAGNOSIS — Z9481 Bone marrow transplant status: Secondary | ICD-10-CM | POA: Diagnosis not present

## 2016-11-23 DIAGNOSIS — D89811 Chronic graft-versus-host disease: Secondary | ICD-10-CM | POA: Diagnosis not present

## 2016-11-23 DIAGNOSIS — C9200 Acute myeloblastic leukemia, not having achieved remission: Secondary | ICD-10-CM | POA: Diagnosis not present

## 2016-11-23 DIAGNOSIS — I4891 Unspecified atrial fibrillation: Secondary | ICD-10-CM | POA: Diagnosis not present

## 2016-11-23 DIAGNOSIS — Z9484 Stem cells transplant status: Secondary | ICD-10-CM | POA: Diagnosis not present

## 2016-11-23 DIAGNOSIS — C9201 Acute myeloblastic leukemia, in remission: Secondary | ICD-10-CM | POA: Diagnosis not present

## 2016-12-20 DIAGNOSIS — Z6831 Body mass index (BMI) 31.0-31.9, adult: Secondary | ICD-10-CM | POA: Diagnosis not present

## 2016-12-20 DIAGNOSIS — I2510 Atherosclerotic heart disease of native coronary artery without angina pectoris: Secondary | ICD-10-CM | POA: Diagnosis not present

## 2016-12-20 DIAGNOSIS — G603 Idiopathic progressive neuropathy: Secondary | ICD-10-CM | POA: Diagnosis not present

## 2016-12-20 DIAGNOSIS — R7303 Prediabetes: Secondary | ICD-10-CM | POA: Diagnosis not present

## 2016-12-20 DIAGNOSIS — D509 Iron deficiency anemia, unspecified: Secondary | ICD-10-CM | POA: Diagnosis not present

## 2016-12-20 DIAGNOSIS — J449 Chronic obstructive pulmonary disease, unspecified: Secondary | ICD-10-CM | POA: Diagnosis not present

## 2016-12-20 DIAGNOSIS — G4733 Obstructive sleep apnea (adult) (pediatric): Secondary | ICD-10-CM | POA: Diagnosis not present

## 2016-12-20 DIAGNOSIS — D531 Other megaloblastic anemias, not elsewhere classified: Secondary | ICD-10-CM | POA: Diagnosis not present

## 2016-12-20 DIAGNOSIS — I482 Chronic atrial fibrillation: Secondary | ICD-10-CM | POA: Diagnosis not present

## 2016-12-20 DIAGNOSIS — Z856 Personal history of leukemia: Secondary | ICD-10-CM | POA: Diagnosis not present

## 2017-02-08 DIAGNOSIS — I252 Old myocardial infarction: Secondary | ICD-10-CM | POA: Diagnosis not present

## 2017-02-08 DIAGNOSIS — I482 Chronic atrial fibrillation: Secondary | ICD-10-CM | POA: Diagnosis not present

## 2017-02-08 DIAGNOSIS — Z7901 Long term (current) use of anticoagulants: Secondary | ICD-10-CM | POA: Diagnosis not present

## 2017-02-08 DIAGNOSIS — R9431 Abnormal electrocardiogram [ECG] [EKG]: Secondary | ICD-10-CM | POA: Diagnosis not present

## 2017-02-08 DIAGNOSIS — C9201 Acute myeloblastic leukemia, in remission: Secondary | ICD-10-CM | POA: Diagnosis not present

## 2017-02-08 DIAGNOSIS — D89811 Chronic graft-versus-host disease: Secondary | ICD-10-CM | POA: Diagnosis not present

## 2017-02-22 DIAGNOSIS — J441 Chronic obstructive pulmonary disease with (acute) exacerbation: Secondary | ICD-10-CM | POA: Diagnosis not present

## 2017-03-06 DIAGNOSIS — J180 Bronchopneumonia, unspecified organism: Secondary | ICD-10-CM | POA: Diagnosis not present

## 2017-03-06 DIAGNOSIS — R05 Cough: Secondary | ICD-10-CM | POA: Diagnosis not present

## 2017-04-17 DIAGNOSIS — H2513 Age-related nuclear cataract, bilateral: Secondary | ICD-10-CM | POA: Diagnosis not present

## 2017-04-17 DIAGNOSIS — H04123 Dry eye syndrome of bilateral lacrimal glands: Secondary | ICD-10-CM | POA: Diagnosis not present

## 2017-04-17 DIAGNOSIS — H1131 Conjunctival hemorrhage, right eye: Secondary | ICD-10-CM | POA: Diagnosis not present

## 2017-04-24 IMAGING — CR DG CHEST 1V PORT
1 series · 1 of 1 positions shown · non-contrast
Comparison: Film from earlier in the same day

CLINICAL DATA: Shortness of breath with fever and cough

EXAM:
PORTABLE CHEST 1 VIEW

[AP]
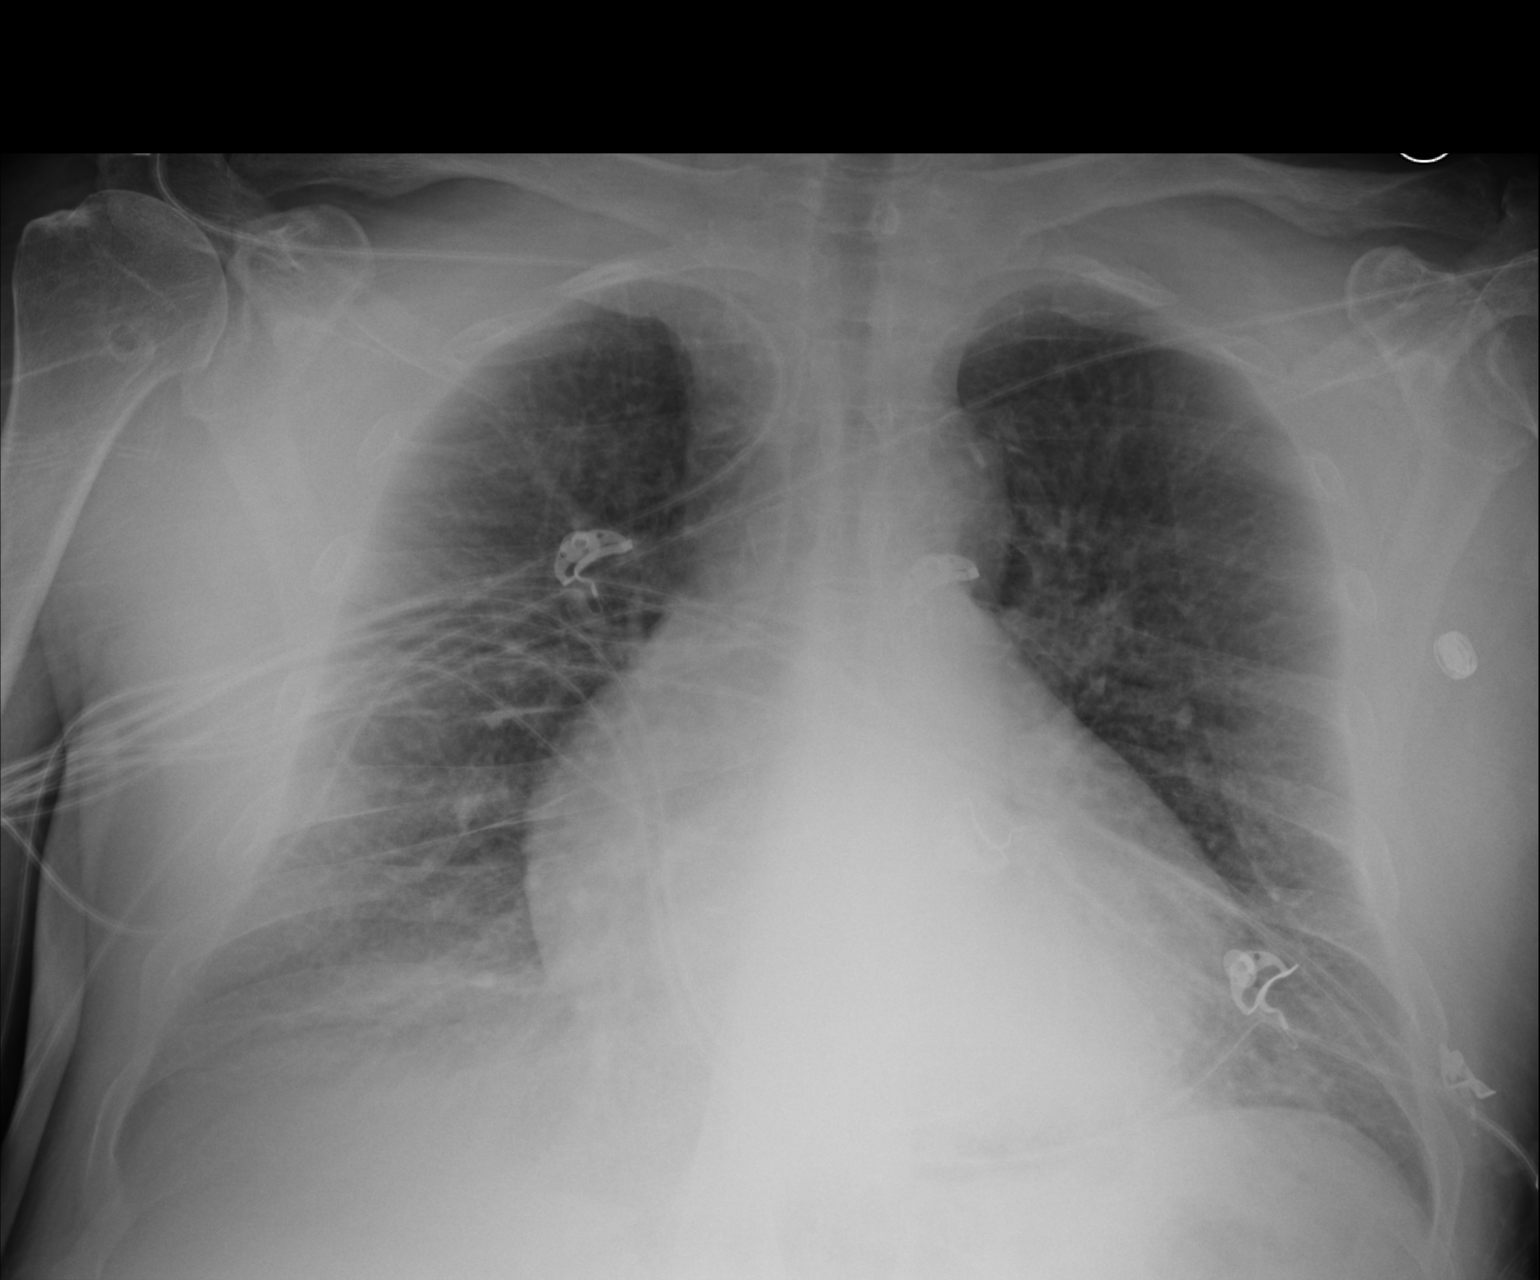

[1 of 1 positions shown; findings below may reference images not displayed]

FINDINGS: Cardiac shadow is stable. The lungs are well aerated bilaterally.
Persistent increased density is noted in the left lung base stable
from the previous exam. No new focal abnormality is seen.
IMPRESSION: No significant change from the prior study.

## 2017-04-25 IMAGING — CR DG CHEST 1V PORT
1 series · 1 of 1 positions shown · non-contrast
Comparison: Portable chest x-ray November 01, 2015

CLINICAL DATA: Respiratory failure, sepsis, former smoker ;
leukemia.

EXAM:
PORTABLE CHEST 1 VIEW

[AP]
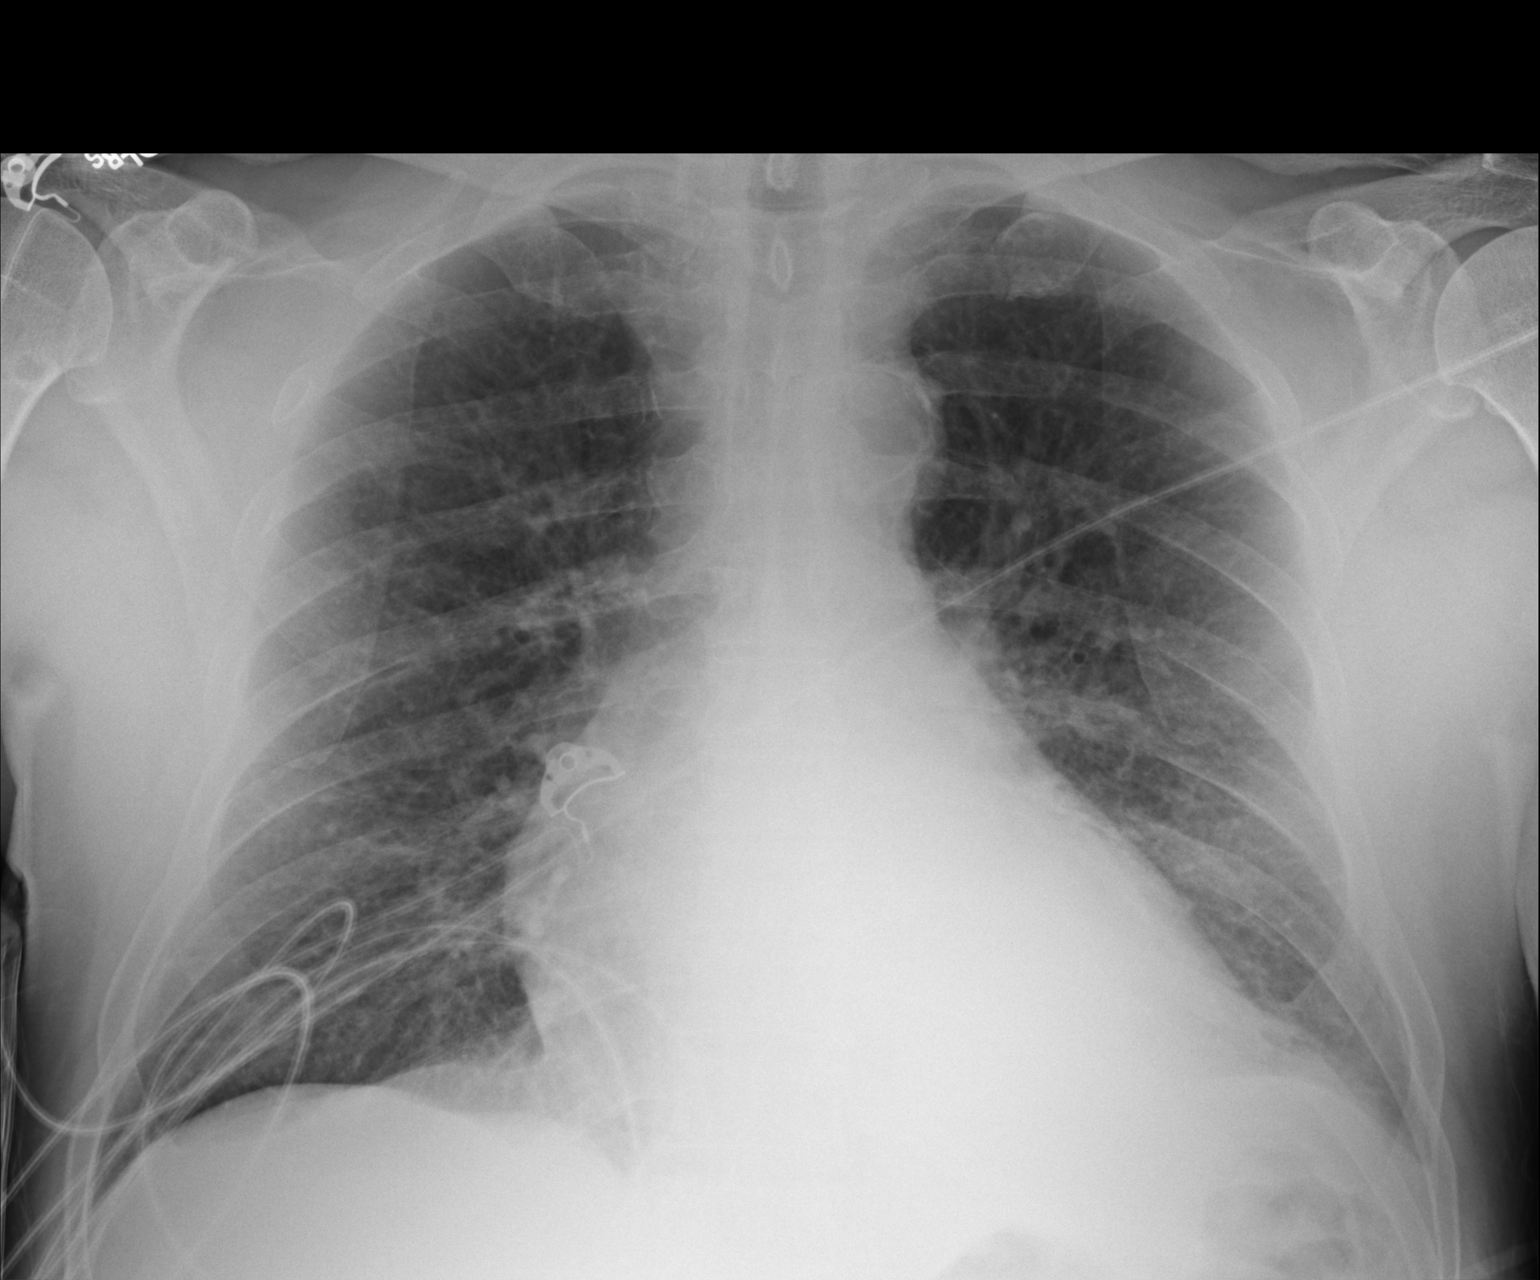

[1 of 1 positions shown; findings below may reference images not displayed]

FINDINGS: The lungs are adequately inflated. The interstitial markings remain
increased bilaterally. The left hemidiaphragm remains partially
obscured but the right hemidiaphragm is better demonstrated. The
cardiac silhouette is mildly enlarged. The pulmonary vascularity is
not engorged.
IMPRESSION: Slight interval improvement in the appearance of the lung bases
especially on the right. This may indicate resolving atelectasis or
infiltrate. Stable mild cardiac enlargement without significant
pulmonary vascular congestion.

A follow-up PA and lateral chest x-ray would be useful.

## 2017-04-26 IMAGING — CR DG CHEST 1V PORT
1 series · 1 of 1 positions shown · non-contrast
Comparison: Portable chest x-ray November 02, 2015

CLINICAL DATA: Respiratory failure, severe sepsis, atrial
fibrillation

EXAM:
PORTABLE CHEST 1 VIEW

[AP]
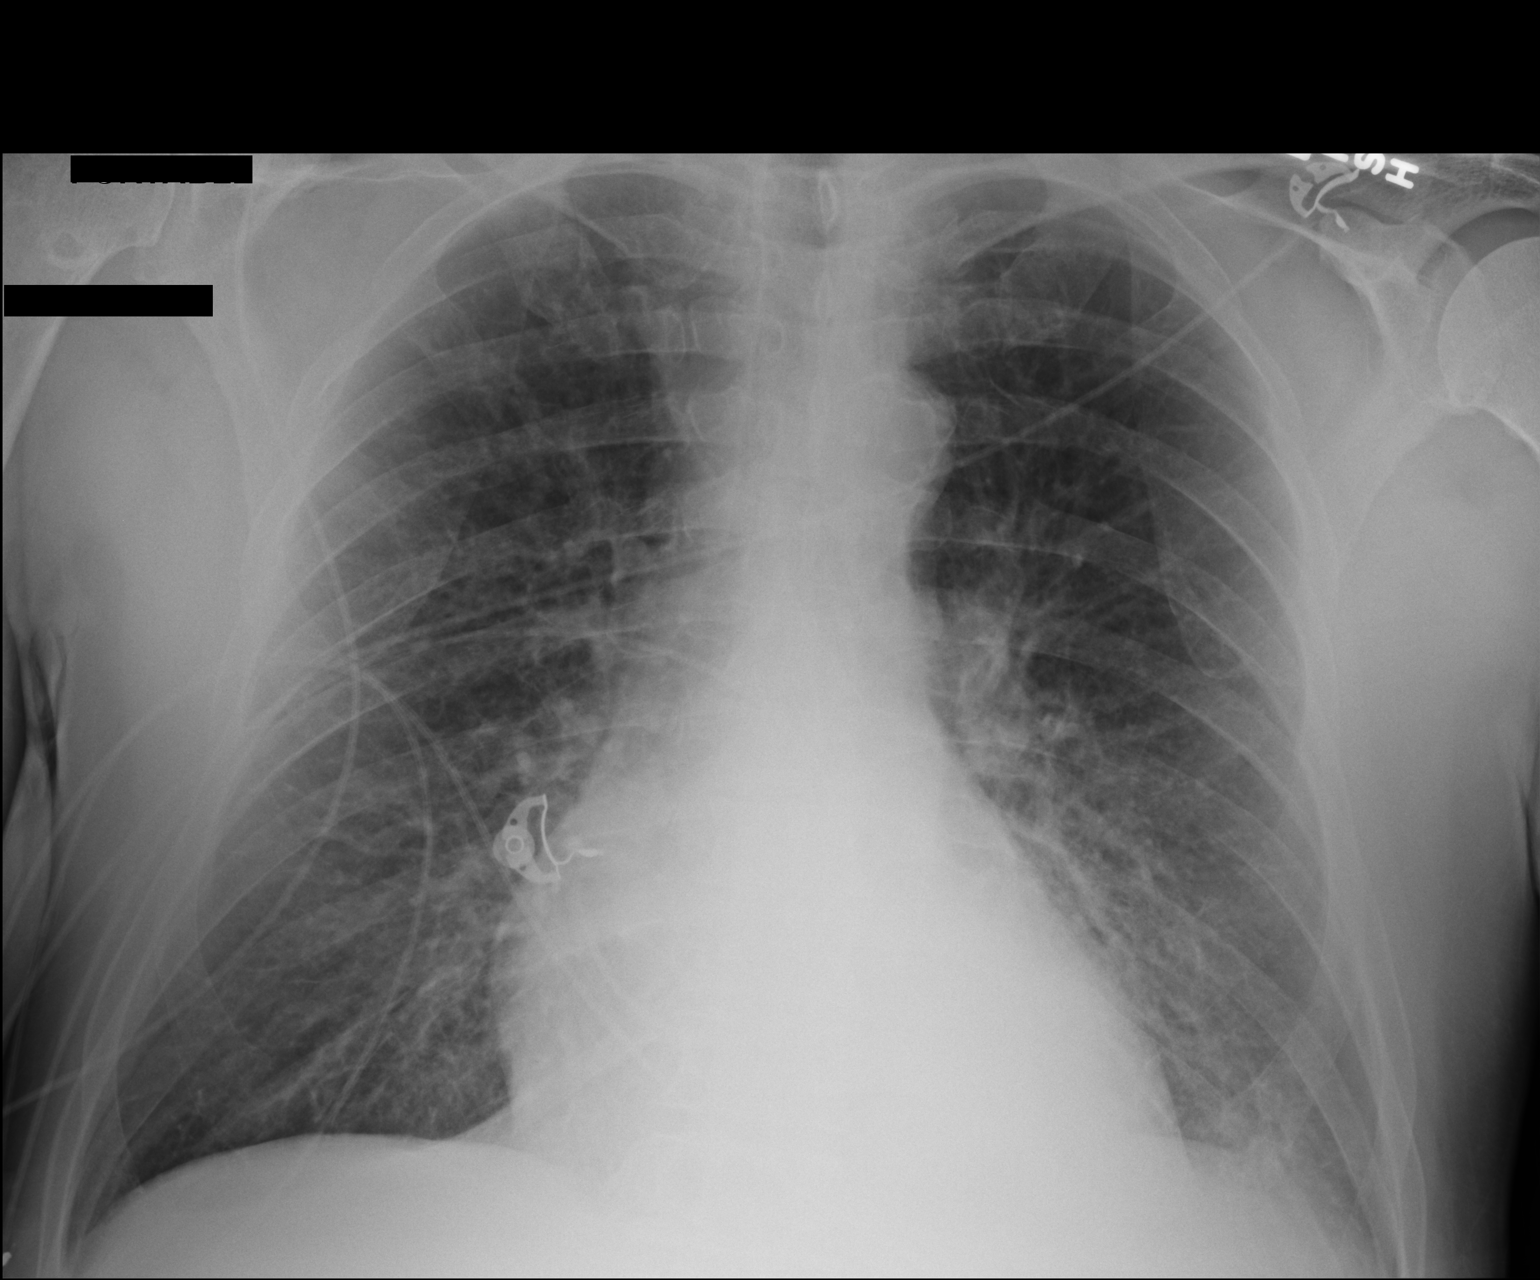

[1 of 1 positions shown; findings below may reference images not displayed]

FINDINGS: The lungs remain hyperinflated with diffusely increased interstitial
markings. There are are stable coarse infrahilar lung markings on
the left with partial obscuration of the hemidiaphragm. The heart is
top-normal in size. The pulmonary vascularity is not engorged. There
is no significant pleural effusion there is no pneumothorax. The
mediastinum is normal in width. The observed bony thorax exhibits no
acute abnormality.
IMPRESSION: COPD. Mild bilateral edema of cardiac or noncardiac cause. Left
lower lobe atelectasis or pneumonia. Overall there has not been
significant interval change since yesterday's study.

## 2017-05-03 DIAGNOSIS — I482 Chronic atrial fibrillation: Secondary | ICD-10-CM | POA: Diagnosis not present

## 2017-05-03 DIAGNOSIS — Z856 Personal history of leukemia: Secondary | ICD-10-CM | POA: Diagnosis not present

## 2017-05-03 DIAGNOSIS — G4733 Obstructive sleep apnea (adult) (pediatric): Secondary | ICD-10-CM | POA: Diagnosis not present

## 2017-05-03 DIAGNOSIS — Z6831 Body mass index (BMI) 31.0-31.9, adult: Secondary | ICD-10-CM | POA: Diagnosis not present

## 2017-05-03 DIAGNOSIS — I2510 Atherosclerotic heart disease of native coronary artery without angina pectoris: Secondary | ICD-10-CM | POA: Diagnosis not present

## 2017-05-03 DIAGNOSIS — J449 Chronic obstructive pulmonary disease, unspecified: Secondary | ICD-10-CM | POA: Diagnosis not present

## 2017-05-03 DIAGNOSIS — G603 Idiopathic progressive neuropathy: Secondary | ICD-10-CM | POA: Diagnosis not present

## 2017-05-03 DIAGNOSIS — R7303 Prediabetes: Secondary | ICD-10-CM | POA: Diagnosis not present

## 2017-05-16 DIAGNOSIS — L03114 Cellulitis of left upper limb: Secondary | ICD-10-CM | POA: Diagnosis not present

## 2017-05-16 DIAGNOSIS — H1131 Conjunctival hemorrhage, right eye: Secondary | ICD-10-CM | POA: Diagnosis not present

## 2017-05-16 DIAGNOSIS — H04123 Dry eye syndrome of bilateral lacrimal glands: Secondary | ICD-10-CM | POA: Diagnosis not present

## 2017-05-16 DIAGNOSIS — H2513 Age-related nuclear cataract, bilateral: Secondary | ICD-10-CM | POA: Diagnosis not present

## 2017-05-23 DIAGNOSIS — R509 Fever, unspecified: Secondary | ICD-10-CM | POA: Diagnosis not present

## 2017-05-23 DIAGNOSIS — C9200 Acute myeloblastic leukemia, not having achieved remission: Secondary | ICD-10-CM | POA: Diagnosis not present

## 2017-05-23 DIAGNOSIS — L03114 Cellulitis of left upper limb: Secondary | ICD-10-CM | POA: Diagnosis not present

## 2017-05-23 DIAGNOSIS — J0100 Acute maxillary sinusitis, unspecified: Secondary | ICD-10-CM | POA: Diagnosis not present

## 2017-05-24 DIAGNOSIS — Z79899 Other long term (current) drug therapy: Secondary | ICD-10-CM | POA: Diagnosis not present

## 2017-05-24 DIAGNOSIS — Z862 Personal history of diseases of the blood and blood-forming organs and certain disorders involving the immune mechanism: Secondary | ICD-10-CM | POA: Diagnosis not present

## 2017-05-24 DIAGNOSIS — Z881 Allergy status to other antibiotic agents status: Secondary | ICD-10-CM | POA: Diagnosis not present

## 2017-05-24 DIAGNOSIS — D89811 Chronic graft-versus-host disease: Secondary | ICD-10-CM | POA: Diagnosis not present

## 2017-05-24 DIAGNOSIS — J988 Other specified respiratory disorders: Secondary | ICD-10-CM | POA: Diagnosis not present

## 2017-05-24 DIAGNOSIS — J449 Chronic obstructive pulmonary disease, unspecified: Secondary | ICD-10-CM | POA: Diagnosis not present

## 2017-05-24 DIAGNOSIS — Z9481 Bone marrow transplant status: Secondary | ICD-10-CM | POA: Diagnosis not present

## 2017-05-24 DIAGNOSIS — Z7951 Long term (current) use of inhaled steroids: Secondary | ICD-10-CM | POA: Diagnosis not present

## 2017-05-24 DIAGNOSIS — Z7901 Long term (current) use of anticoagulants: Secondary | ICD-10-CM | POA: Diagnosis not present

## 2017-05-24 DIAGNOSIS — C9201 Acute myeloblastic leukemia, in remission: Secondary | ICD-10-CM | POA: Diagnosis not present

## 2017-05-24 DIAGNOSIS — Z888 Allergy status to other drugs, medicaments and biological substances status: Secondary | ICD-10-CM | POA: Diagnosis not present

## 2017-05-24 DIAGNOSIS — Z885 Allergy status to narcotic agent status: Secondary | ICD-10-CM | POA: Diagnosis not present

## 2017-05-24 DIAGNOSIS — I4891 Unspecified atrial fibrillation: Secondary | ICD-10-CM | POA: Diagnosis not present

## 2017-05-24 DIAGNOSIS — I482 Chronic atrial fibrillation: Secondary | ICD-10-CM | POA: Diagnosis not present

## 2017-06-04 DIAGNOSIS — J441 Chronic obstructive pulmonary disease with (acute) exacerbation: Secondary | ICD-10-CM | POA: Diagnosis not present

## 2017-06-22 DIAGNOSIS — Z23 Encounter for immunization: Secondary | ICD-10-CM | POA: Diagnosis not present

## 2017-08-30 DIAGNOSIS — Z9481 Bone marrow transplant status: Secondary | ICD-10-CM | POA: Diagnosis not present

## 2017-08-30 DIAGNOSIS — Z23 Encounter for immunization: Secondary | ICD-10-CM | POA: Diagnosis not present

## 2017-08-30 DIAGNOSIS — C9201 Acute myeloblastic leukemia, in remission: Secondary | ICD-10-CM | POA: Diagnosis not present

## 2017-09-25 DIAGNOSIS — J09X2 Influenza due to identified novel influenza A virus with other respiratory manifestations: Secondary | ICD-10-CM | POA: Diagnosis not present

## 2017-09-25 DIAGNOSIS — J410 Simple chronic bronchitis: Secondary | ICD-10-CM | POA: Diagnosis not present

## 2017-10-09 DIAGNOSIS — J0100 Acute maxillary sinusitis, unspecified: Secondary | ICD-10-CM | POA: Diagnosis not present

## 2017-10-09 DIAGNOSIS — J180 Bronchopneumonia, unspecified organism: Secondary | ICD-10-CM | POA: Diagnosis not present

## 2017-10-18 DIAGNOSIS — J441 Chronic obstructive pulmonary disease with (acute) exacerbation: Secondary | ICD-10-CM | POA: Diagnosis not present

## 2017-10-31 DIAGNOSIS — H40013 Open angle with borderline findings, low risk, bilateral: Secondary | ICD-10-CM | POA: Diagnosis not present

## 2017-10-31 DIAGNOSIS — H04123 Dry eye syndrome of bilateral lacrimal glands: Secondary | ICD-10-CM | POA: Diagnosis not present

## 2017-10-31 DIAGNOSIS — H2513 Age-related nuclear cataract, bilateral: Secondary | ICD-10-CM | POA: Diagnosis not present

## 2017-11-13 DIAGNOSIS — I700 Atherosclerosis of aorta: Secondary | ICD-10-CM | POA: Diagnosis not present

## 2017-11-13 DIAGNOSIS — J180 Bronchopneumonia, unspecified organism: Secondary | ICD-10-CM | POA: Diagnosis not present

## 2017-11-13 DIAGNOSIS — R918 Other nonspecific abnormal finding of lung field: Secondary | ICD-10-CM | POA: Diagnosis not present

## 2017-11-13 DIAGNOSIS — R05 Cough: Secondary | ICD-10-CM | POA: Diagnosis not present

## 2017-11-13 DIAGNOSIS — J189 Pneumonia, unspecified organism: Secondary | ICD-10-CM | POA: Diagnosis not present

## 2017-11-30 DIAGNOSIS — C9201 Acute myeloblastic leukemia, in remission: Secondary | ICD-10-CM | POA: Diagnosis not present

## 2017-11-30 DIAGNOSIS — D61818 Other pancytopenia: Secondary | ICD-10-CM | POA: Diagnosis not present

## 2017-11-30 DIAGNOSIS — Z9481 Bone marrow transplant status: Secondary | ICD-10-CM | POA: Diagnosis not present

## 2017-11-30 DIAGNOSIS — R0981 Nasal congestion: Secondary | ICD-10-CM | POA: Diagnosis not present

## 2017-11-30 DIAGNOSIS — D649 Anemia, unspecified: Secondary | ICD-10-CM | POA: Diagnosis not present

## 2017-11-30 DIAGNOSIS — Z9221 Personal history of antineoplastic chemotherapy: Secondary | ICD-10-CM | POA: Diagnosis not present

## 2017-11-30 DIAGNOSIS — J329 Chronic sinusitis, unspecified: Secondary | ICD-10-CM | POA: Diagnosis not present

## 2017-11-30 DIAGNOSIS — Z9484 Stem cells transplant status: Secondary | ICD-10-CM | POA: Diagnosis not present

## 2017-11-30 DIAGNOSIS — D696 Thrombocytopenia, unspecified: Secondary | ICD-10-CM | POA: Diagnosis not present

## 2017-12-04 DIAGNOSIS — D61818 Other pancytopenia: Secondary | ICD-10-CM | POA: Diagnosis not present

## 2017-12-04 DIAGNOSIS — D6481 Anemia due to antineoplastic chemotherapy: Secondary | ICD-10-CM | POA: Diagnosis not present

## 2017-12-04 DIAGNOSIS — J329 Chronic sinusitis, unspecified: Secondary | ICD-10-CM | POA: Diagnosis not present

## 2017-12-04 DIAGNOSIS — Z9481 Bone marrow transplant status: Secondary | ICD-10-CM | POA: Diagnosis not present

## 2017-12-04 DIAGNOSIS — C9201 Acute myeloblastic leukemia, in remission: Secondary | ICD-10-CM | POA: Diagnosis not present

## 2017-12-04 DIAGNOSIS — J0100 Acute maxillary sinusitis, unspecified: Secondary | ICD-10-CM | POA: Diagnosis not present

## 2017-12-12 DIAGNOSIS — C9201 Acute myeloblastic leukemia, in remission: Secondary | ICD-10-CM | POA: Diagnosis not present

## 2017-12-12 DIAGNOSIS — Z9481 Bone marrow transplant status: Secondary | ICD-10-CM | POA: Diagnosis not present

## 2017-12-14 DIAGNOSIS — Z9481 Bone marrow transplant status: Secondary | ICD-10-CM | POA: Diagnosis not present

## 2017-12-14 DIAGNOSIS — C9201 Acute myeloblastic leukemia, in remission: Secondary | ICD-10-CM | POA: Diagnosis not present

## 2017-12-14 DIAGNOSIS — C9202 Acute myeloblastic leukemia, in relapse: Secondary | ICD-10-CM | POA: Diagnosis not present

## 2017-12-14 DIAGNOSIS — D61818 Other pancytopenia: Secondary | ICD-10-CM | POA: Diagnosis not present

## 2017-12-14 DIAGNOSIS — Z806 Family history of leukemia: Secondary | ICD-10-CM | POA: Insufficient documentation

## 2017-12-24 DIAGNOSIS — Z9481 Bone marrow transplant status: Secondary | ICD-10-CM | POA: Diagnosis not present

## 2017-12-24 DIAGNOSIS — J342 Deviated nasal septum: Secondary | ICD-10-CM | POA: Diagnosis not present

## 2017-12-24 DIAGNOSIS — H9190 Unspecified hearing loss, unspecified ear: Secondary | ICD-10-CM | POA: Diagnosis not present

## 2017-12-24 DIAGNOSIS — G4733 Obstructive sleep apnea (adult) (pediatric): Secondary | ICD-10-CM | POA: Diagnosis not present

## 2017-12-24 DIAGNOSIS — Z87891 Personal history of nicotine dependence: Secondary | ICD-10-CM | POA: Diagnosis not present

## 2017-12-24 DIAGNOSIS — J320 Chronic maxillary sinusitis: Secondary | ICD-10-CM | POA: Diagnosis not present

## 2017-12-24 DIAGNOSIS — C9201 Acute myeloblastic leukemia, in remission: Secondary | ICD-10-CM | POA: Diagnosis not present

## 2017-12-24 DIAGNOSIS — J3489 Other specified disorders of nose and nasal sinuses: Secondary | ICD-10-CM | POA: Diagnosis not present

## 2017-12-24 DIAGNOSIS — Z9989 Dependence on other enabling machines and devices: Secondary | ICD-10-CM | POA: Diagnosis not present

## 2017-12-24 DIAGNOSIS — Z7901 Long term (current) use of anticoagulants: Secondary | ICD-10-CM | POA: Diagnosis not present

## 2017-12-24 DIAGNOSIS — J324 Chronic pansinusitis: Secondary | ICD-10-CM | POA: Diagnosis not present

## 2017-12-24 DIAGNOSIS — I4891 Unspecified atrial fibrillation: Secondary | ICD-10-CM | POA: Diagnosis not present

## 2017-12-24 DIAGNOSIS — J321 Chronic frontal sinusitis: Secondary | ICD-10-CM | POA: Diagnosis not present

## 2017-12-26 DIAGNOSIS — G4733 Obstructive sleep apnea (adult) (pediatric): Secondary | ICD-10-CM | POA: Diagnosis not present

## 2017-12-26 DIAGNOSIS — Z9989 Dependence on other enabling machines and devices: Secondary | ICD-10-CM | POA: Diagnosis not present

## 2017-12-26 DIAGNOSIS — Z48290 Encounter for aftercare following bone marrow transplant: Secondary | ICD-10-CM | POA: Diagnosis not present

## 2017-12-26 DIAGNOSIS — D61818 Other pancytopenia: Secondary | ICD-10-CM | POA: Diagnosis not present

## 2017-12-26 DIAGNOSIS — C9201 Acute myeloblastic leukemia, in remission: Secondary | ICD-10-CM | POA: Diagnosis not present

## 2017-12-26 DIAGNOSIS — R05 Cough: Secondary | ICD-10-CM | POA: Diagnosis not present

## 2017-12-26 DIAGNOSIS — Z9481 Bone marrow transplant status: Secondary | ICD-10-CM | POA: Diagnosis not present

## 2017-12-28 DIAGNOSIS — Z9481 Bone marrow transplant status: Secondary | ICD-10-CM | POA: Diagnosis not present

## 2017-12-28 DIAGNOSIS — D61818 Other pancytopenia: Secondary | ICD-10-CM | POA: Diagnosis not present

## 2017-12-28 DIAGNOSIS — I517 Cardiomegaly: Secondary | ICD-10-CM | POA: Diagnosis not present

## 2017-12-28 DIAGNOSIS — C9201 Acute myeloblastic leukemia, in remission: Secondary | ICD-10-CM | POA: Diagnosis not present

## 2017-12-28 DIAGNOSIS — C9200 Acute myeloblastic leukemia, not having achieved remission: Secondary | ICD-10-CM | POA: Diagnosis not present

## 2017-12-28 DIAGNOSIS — C9202 Acute myeloblastic leukemia, in relapse: Secondary | ICD-10-CM | POA: Diagnosis not present

## 2017-12-28 DIAGNOSIS — R05 Cough: Secondary | ICD-10-CM | POA: Diagnosis not present

## 2018-01-03 DIAGNOSIS — D759 Disease of blood and blood-forming organs, unspecified: Secondary | ICD-10-CM | POA: Diagnosis not present

## 2018-01-03 DIAGNOSIS — Z9481 Bone marrow transplant status: Secondary | ICD-10-CM | POA: Diagnosis not present

## 2018-01-03 DIAGNOSIS — D649 Anemia, unspecified: Secondary | ICD-10-CM | POA: Diagnosis not present

## 2018-01-03 DIAGNOSIS — D696 Thrombocytopenia, unspecified: Secondary | ICD-10-CM | POA: Diagnosis not present

## 2018-01-03 DIAGNOSIS — I4891 Unspecified atrial fibrillation: Secondary | ICD-10-CM | POA: Diagnosis not present

## 2018-01-03 DIAGNOSIS — J320 Chronic maxillary sinusitis: Secondary | ICD-10-CM | POA: Diagnosis not present

## 2018-01-03 DIAGNOSIS — J479 Bronchiectasis, uncomplicated: Secondary | ICD-10-CM | POA: Diagnosis not present

## 2018-01-03 DIAGNOSIS — D61818 Other pancytopenia: Secondary | ICD-10-CM | POA: Diagnosis not present

## 2018-01-03 DIAGNOSIS — C9201 Acute myeloblastic leukemia, in remission: Secondary | ICD-10-CM | POA: Diagnosis not present

## 2018-01-28 DIAGNOSIS — Z87891 Personal history of nicotine dependence: Secondary | ICD-10-CM | POA: Diagnosis not present

## 2018-01-28 DIAGNOSIS — H6981 Other specified disorders of Eustachian tube, right ear: Secondary | ICD-10-CM | POA: Diagnosis not present

## 2018-01-28 DIAGNOSIS — H903 Sensorineural hearing loss, bilateral: Secondary | ICD-10-CM | POA: Diagnosis not present

## 2018-01-28 DIAGNOSIS — H93231 Hyperacusis, right ear: Secondary | ICD-10-CM | POA: Diagnosis not present

## 2018-01-28 DIAGNOSIS — H918X1 Other specified hearing loss, right ear: Secondary | ICD-10-CM | POA: Diagnosis not present

## 2018-01-28 DIAGNOSIS — I4891 Unspecified atrial fibrillation: Secondary | ICD-10-CM | POA: Diagnosis not present

## 2018-01-28 DIAGNOSIS — D696 Thrombocytopenia, unspecified: Secondary | ICD-10-CM | POA: Diagnosis not present

## 2018-01-28 DIAGNOSIS — G4733 Obstructive sleep apnea (adult) (pediatric): Secondary | ICD-10-CM | POA: Diagnosis not present

## 2018-01-28 DIAGNOSIS — H9311 Tinnitus, right ear: Secondary | ICD-10-CM | POA: Diagnosis not present

## 2018-01-28 DIAGNOSIS — J322 Chronic ethmoidal sinusitis: Secondary | ICD-10-CM | POA: Diagnosis not present

## 2018-01-28 DIAGNOSIS — J320 Chronic maxillary sinusitis: Secondary | ICD-10-CM | POA: Diagnosis not present

## 2018-01-28 DIAGNOSIS — Z7901 Long term (current) use of anticoagulants: Secondary | ICD-10-CM | POA: Diagnosis not present

## 2018-01-28 DIAGNOSIS — J328 Other chronic sinusitis: Secondary | ICD-10-CM | POA: Diagnosis not present

## 2018-01-28 DIAGNOSIS — C9201 Acute myeloblastic leukemia, in remission: Secondary | ICD-10-CM | POA: Diagnosis not present

## 2018-01-28 DIAGNOSIS — J321 Chronic frontal sinusitis: Secondary | ICD-10-CM | POA: Diagnosis not present

## 2018-01-31 DIAGNOSIS — Z9484 Stem cells transplant status: Secondary | ICD-10-CM | POA: Diagnosis not present

## 2018-01-31 DIAGNOSIS — J329 Chronic sinusitis, unspecified: Secondary | ICD-10-CM | POA: Diagnosis not present

## 2018-01-31 DIAGNOSIS — G4733 Obstructive sleep apnea (adult) (pediatric): Secondary | ICD-10-CM | POA: Diagnosis not present

## 2018-01-31 DIAGNOSIS — I482 Chronic atrial fibrillation: Secondary | ICD-10-CM | POA: Diagnosis not present

## 2018-01-31 DIAGNOSIS — Z79899 Other long term (current) drug therapy: Secondary | ICD-10-CM | POA: Diagnosis not present

## 2018-01-31 DIAGNOSIS — Z7901 Long term (current) use of anticoagulants: Secondary | ICD-10-CM | POA: Diagnosis not present

## 2018-01-31 DIAGNOSIS — D61818 Other pancytopenia: Secondary | ICD-10-CM | POA: Diagnosis not present

## 2018-01-31 DIAGNOSIS — C9201 Acute myeloblastic leukemia, in remission: Secondary | ICD-10-CM | POA: Diagnosis not present

## 2018-01-31 DIAGNOSIS — Z9481 Bone marrow transplant status: Secondary | ICD-10-CM | POA: Diagnosis not present

## 2018-01-31 DIAGNOSIS — D89811 Chronic graft-versus-host disease: Secondary | ICD-10-CM | POA: Diagnosis not present

## 2018-01-31 DIAGNOSIS — C9200 Acute myeloblastic leukemia, not having achieved remission: Secondary | ICD-10-CM | POA: Diagnosis not present

## 2018-01-31 DIAGNOSIS — J320 Chronic maxillary sinusitis: Secondary | ICD-10-CM | POA: Diagnosis not present

## 2018-01-31 DIAGNOSIS — T8189XA Other complications of procedures, not elsewhere classified, initial encounter: Secondary | ICD-10-CM | POA: Diagnosis not present

## 2018-01-31 DIAGNOSIS — I4891 Unspecified atrial fibrillation: Secondary | ICD-10-CM | POA: Diagnosis not present

## 2018-02-07 DIAGNOSIS — Z87891 Personal history of nicotine dependence: Secondary | ICD-10-CM | POA: Diagnosis not present

## 2018-02-07 DIAGNOSIS — C9201 Acute myeloblastic leukemia, in remission: Secondary | ICD-10-CM | POA: Diagnosis not present

## 2018-02-07 DIAGNOSIS — Z7901 Long term (current) use of anticoagulants: Secondary | ICD-10-CM | POA: Diagnosis not present

## 2018-02-07 DIAGNOSIS — G4733 Obstructive sleep apnea (adult) (pediatric): Secondary | ICD-10-CM | POA: Diagnosis not present

## 2018-02-07 DIAGNOSIS — I482 Chronic atrial fibrillation: Secondary | ICD-10-CM | POA: Diagnosis not present

## 2018-02-11 DIAGNOSIS — Z9481 Bone marrow transplant status: Secondary | ICD-10-CM | POA: Diagnosis not present

## 2018-02-11 DIAGNOSIS — C9201 Acute myeloblastic leukemia, in remission: Secondary | ICD-10-CM | POA: Diagnosis not present

## 2018-02-28 DIAGNOSIS — I051 Rheumatic mitral insufficiency: Secondary | ICD-10-CM | POA: Diagnosis not present

## 2018-02-28 DIAGNOSIS — T8189XA Other complications of procedures, not elsewhere classified, initial encounter: Secondary | ICD-10-CM | POA: Diagnosis not present

## 2018-02-28 DIAGNOSIS — J320 Chronic maxillary sinusitis: Secondary | ICD-10-CM | POA: Diagnosis not present

## 2018-02-28 DIAGNOSIS — I4891 Unspecified atrial fibrillation: Secondary | ICD-10-CM | POA: Diagnosis not present

## 2018-02-28 DIAGNOSIS — Z66 Do not resuscitate: Secondary | ICD-10-CM | POA: Diagnosis not present

## 2018-02-28 DIAGNOSIS — Z9481 Bone marrow transplant status: Secondary | ICD-10-CM | POA: Diagnosis not present

## 2018-02-28 DIAGNOSIS — I371 Nonrheumatic pulmonary valve insufficiency: Secondary | ICD-10-CM | POA: Diagnosis not present

## 2018-02-28 DIAGNOSIS — C9201 Acute myeloblastic leukemia, in remission: Secondary | ICD-10-CM | POA: Diagnosis not present

## 2018-02-28 DIAGNOSIS — I482 Chronic atrial fibrillation: Secondary | ICD-10-CM | POA: Diagnosis not present

## 2018-02-28 DIAGNOSIS — I071 Rheumatic tricuspid insufficiency: Secondary | ICD-10-CM | POA: Diagnosis not present

## 2018-02-28 DIAGNOSIS — I058 Other rheumatic mitral valve diseases: Secondary | ICD-10-CM | POA: Diagnosis not present

## 2018-02-28 DIAGNOSIS — G4733 Obstructive sleep apnea (adult) (pediatric): Secondary | ICD-10-CM | POA: Diagnosis not present

## 2018-02-28 DIAGNOSIS — Z9221 Personal history of antineoplastic chemotherapy: Secondary | ICD-10-CM | POA: Diagnosis not present

## 2018-02-28 DIAGNOSIS — C9200 Acute myeloblastic leukemia, not having achieved remission: Secondary | ICD-10-CM | POA: Diagnosis not present

## 2018-02-28 DIAGNOSIS — D61818 Other pancytopenia: Secondary | ICD-10-CM | POA: Diagnosis not present

## 2018-02-28 DIAGNOSIS — Z9989 Dependence on other enabling machines and devices: Secondary | ICD-10-CM | POA: Diagnosis not present

## 2018-02-28 DIAGNOSIS — Z7901 Long term (current) use of anticoagulants: Secondary | ICD-10-CM | POA: Diagnosis not present

## 2018-02-28 DIAGNOSIS — I358 Other nonrheumatic aortic valve disorders: Secondary | ICD-10-CM | POA: Diagnosis not present

## 2018-03-14 DIAGNOSIS — C9201 Acute myeloblastic leukemia, in remission: Secondary | ICD-10-CM | POA: Diagnosis not present

## 2018-03-21 DIAGNOSIS — Z79899 Other long term (current) drug therapy: Secondary | ICD-10-CM | POA: Diagnosis not present

## 2018-03-21 DIAGNOSIS — C9201 Acute myeloblastic leukemia, in remission: Secondary | ICD-10-CM | POA: Diagnosis not present

## 2018-03-28 DIAGNOSIS — Z9481 Bone marrow transplant status: Secondary | ICD-10-CM | POA: Diagnosis not present

## 2018-03-28 DIAGNOSIS — C9200 Acute myeloblastic leukemia, not having achieved remission: Secondary | ICD-10-CM | POA: Diagnosis not present

## 2018-03-28 DIAGNOSIS — I4891 Unspecified atrial fibrillation: Secondary | ICD-10-CM | POA: Diagnosis not present

## 2018-03-28 DIAGNOSIS — I482 Chronic atrial fibrillation: Secondary | ICD-10-CM | POA: Diagnosis not present

## 2018-03-28 DIAGNOSIS — C9201 Acute myeloblastic leukemia, in remission: Secondary | ICD-10-CM | POA: Diagnosis not present

## 2018-03-28 DIAGNOSIS — D61818 Other pancytopenia: Secondary | ICD-10-CM | POA: Diagnosis not present

## 2018-03-28 DIAGNOSIS — Z7901 Long term (current) use of anticoagulants: Secondary | ICD-10-CM | POA: Diagnosis not present

## 2018-03-28 DIAGNOSIS — D469 Myelodysplastic syndrome, unspecified: Secondary | ICD-10-CM | POA: Diagnosis not present

## 2018-03-28 DIAGNOSIS — T8189XA Other complications of procedures, not elsewhere classified, initial encounter: Secondary | ICD-10-CM | POA: Diagnosis not present

## 2018-03-28 DIAGNOSIS — J320 Chronic maxillary sinusitis: Secondary | ICD-10-CM | POA: Diagnosis not present

## 2018-04-01 DIAGNOSIS — Z79899 Other long term (current) drug therapy: Secondary | ICD-10-CM | POA: Diagnosis not present

## 2018-04-01 DIAGNOSIS — Z5111 Encounter for antineoplastic chemotherapy: Secondary | ICD-10-CM | POA: Diagnosis not present

## 2018-04-01 DIAGNOSIS — C9201 Acute myeloblastic leukemia, in remission: Secondary | ICD-10-CM | POA: Diagnosis not present

## 2018-04-02 DIAGNOSIS — Z5111 Encounter for antineoplastic chemotherapy: Secondary | ICD-10-CM | POA: Diagnosis not present

## 2018-04-02 DIAGNOSIS — C9201 Acute myeloblastic leukemia, in remission: Secondary | ICD-10-CM | POA: Diagnosis not present

## 2018-04-03 DIAGNOSIS — C9201 Acute myeloblastic leukemia, in remission: Secondary | ICD-10-CM | POA: Diagnosis not present

## 2018-04-03 DIAGNOSIS — Z5111 Encounter for antineoplastic chemotherapy: Secondary | ICD-10-CM | POA: Diagnosis not present

## 2018-04-03 DIAGNOSIS — Z79899 Other long term (current) drug therapy: Secondary | ICD-10-CM | POA: Diagnosis not present

## 2018-04-04 DIAGNOSIS — C9201 Acute myeloblastic leukemia, in remission: Secondary | ICD-10-CM | POA: Diagnosis not present

## 2018-04-04 DIAGNOSIS — Z79899 Other long term (current) drug therapy: Secondary | ICD-10-CM | POA: Diagnosis not present

## 2018-04-04 DIAGNOSIS — Z9484 Stem cells transplant status: Secondary | ICD-10-CM | POA: Diagnosis not present

## 2018-04-04 DIAGNOSIS — Z5111 Encounter for antineoplastic chemotherapy: Secondary | ICD-10-CM | POA: Diagnosis not present

## 2018-04-05 DIAGNOSIS — Z79899 Other long term (current) drug therapy: Secondary | ICD-10-CM | POA: Diagnosis not present

## 2018-04-05 DIAGNOSIS — C9201 Acute myeloblastic leukemia, in remission: Secondary | ICD-10-CM | POA: Diagnosis not present

## 2018-04-05 DIAGNOSIS — Z5111 Encounter for antineoplastic chemotherapy: Secondary | ICD-10-CM | POA: Diagnosis not present

## 2018-04-08 DIAGNOSIS — C9201 Acute myeloblastic leukemia, in remission: Secondary | ICD-10-CM | POA: Diagnosis not present

## 2018-04-11 DIAGNOSIS — C9201 Acute myeloblastic leukemia, in remission: Secondary | ICD-10-CM | POA: Diagnosis not present

## 2018-04-16 DIAGNOSIS — C9201 Acute myeloblastic leukemia, in remission: Secondary | ICD-10-CM | POA: Diagnosis not present

## 2018-04-18 DIAGNOSIS — C9202 Acute myeloblastic leukemia, in relapse: Secondary | ICD-10-CM | POA: Diagnosis not present

## 2018-04-22 DIAGNOSIS — C9201 Acute myeloblastic leukemia, in remission: Secondary | ICD-10-CM | POA: Diagnosis not present

## 2018-04-25 DIAGNOSIS — C9201 Acute myeloblastic leukemia, in remission: Secondary | ICD-10-CM | POA: Diagnosis not present

## 2018-04-25 DIAGNOSIS — C9200 Acute myeloblastic leukemia, not having achieved remission: Secondary | ICD-10-CM | POA: Diagnosis not present

## 2018-04-25 DIAGNOSIS — E790 Hyperuricemia without signs of inflammatory arthritis and tophaceous disease: Secondary | ICD-10-CM | POA: Diagnosis not present

## 2018-04-25 DIAGNOSIS — Z7901 Long term (current) use of anticoagulants: Secondary | ICD-10-CM | POA: Diagnosis not present

## 2018-04-25 DIAGNOSIS — D708 Other neutropenia: Secondary | ICD-10-CM | POA: Diagnosis not present

## 2018-04-25 DIAGNOSIS — D649 Anemia, unspecified: Secondary | ICD-10-CM | POA: Diagnosis not present

## 2018-04-25 DIAGNOSIS — D469 Myelodysplastic syndrome, unspecified: Secondary | ICD-10-CM | POA: Diagnosis not present

## 2018-04-25 DIAGNOSIS — Z9481 Bone marrow transplant status: Secondary | ICD-10-CM | POA: Diagnosis not present

## 2018-04-25 DIAGNOSIS — D72819 Decreased white blood cell count, unspecified: Secondary | ICD-10-CM | POA: Diagnosis not present

## 2018-04-26 DIAGNOSIS — D469 Myelodysplastic syndrome, unspecified: Secondary | ICD-10-CM | POA: Diagnosis not present

## 2018-04-26 DIAGNOSIS — Z9481 Bone marrow transplant status: Secondary | ICD-10-CM | POA: Diagnosis not present

## 2018-05-06 DIAGNOSIS — Z7901 Long term (current) use of anticoagulants: Secondary | ICD-10-CM | POA: Diagnosis not present

## 2018-05-06 DIAGNOSIS — Z9484 Stem cells transplant status: Secondary | ICD-10-CM | POA: Diagnosis not present

## 2018-05-06 DIAGNOSIS — I482 Chronic atrial fibrillation: Secondary | ICD-10-CM | POA: Diagnosis not present

## 2018-05-06 DIAGNOSIS — C9201 Acute myeloblastic leukemia, in remission: Secondary | ICD-10-CM | POA: Diagnosis not present

## 2018-05-06 DIAGNOSIS — C9202 Acute myeloblastic leukemia, in relapse: Secondary | ICD-10-CM | POA: Diagnosis not present

## 2018-05-06 DIAGNOSIS — Z79899 Other long term (current) drug therapy: Secondary | ICD-10-CM | POA: Diagnosis not present

## 2018-05-07 DIAGNOSIS — Z5111 Encounter for antineoplastic chemotherapy: Secondary | ICD-10-CM | POA: Diagnosis not present

## 2018-05-07 DIAGNOSIS — C9201 Acute myeloblastic leukemia, in remission: Secondary | ICD-10-CM | POA: Diagnosis not present

## 2018-05-07 DIAGNOSIS — Z79899 Other long term (current) drug therapy: Secondary | ICD-10-CM | POA: Diagnosis not present

## 2018-05-08 DIAGNOSIS — Z5111 Encounter for antineoplastic chemotherapy: Secondary | ICD-10-CM | POA: Diagnosis not present

## 2018-05-08 DIAGNOSIS — Z79899 Other long term (current) drug therapy: Secondary | ICD-10-CM | POA: Diagnosis not present

## 2018-05-08 DIAGNOSIS — C9201 Acute myeloblastic leukemia, in remission: Secondary | ICD-10-CM | POA: Diagnosis not present

## 2018-05-09 DIAGNOSIS — Z79899 Other long term (current) drug therapy: Secondary | ICD-10-CM | POA: Diagnosis not present

## 2018-05-09 DIAGNOSIS — C9201 Acute myeloblastic leukemia, in remission: Secondary | ICD-10-CM | POA: Diagnosis not present

## 2018-05-09 DIAGNOSIS — Z5111 Encounter for antineoplastic chemotherapy: Secondary | ICD-10-CM | POA: Diagnosis not present

## 2018-05-10 DIAGNOSIS — Z79899 Other long term (current) drug therapy: Secondary | ICD-10-CM | POA: Diagnosis not present

## 2018-05-10 DIAGNOSIS — Z5111 Encounter for antineoplastic chemotherapy: Secondary | ICD-10-CM | POA: Diagnosis not present

## 2018-05-10 DIAGNOSIS — C9201 Acute myeloblastic leukemia, in remission: Secondary | ICD-10-CM | POA: Diagnosis not present

## 2018-05-10 DIAGNOSIS — Z9484 Stem cells transplant status: Secondary | ICD-10-CM | POA: Diagnosis not present

## 2018-05-13 DIAGNOSIS — C9201 Acute myeloblastic leukemia, in remission: Secondary | ICD-10-CM | POA: Diagnosis not present

## 2018-05-20 DIAGNOSIS — C9201 Acute myeloblastic leukemia, in remission: Secondary | ICD-10-CM | POA: Diagnosis not present

## 2018-05-27 DIAGNOSIS — I4891 Unspecified atrial fibrillation: Secondary | ICD-10-CM | POA: Diagnosis not present

## 2018-05-27 DIAGNOSIS — H2513 Age-related nuclear cataract, bilateral: Secondary | ICD-10-CM | POA: Diagnosis not present

## 2018-05-27 DIAGNOSIS — H04123 Dry eye syndrome of bilateral lacrimal glands: Secondary | ICD-10-CM | POA: Diagnosis not present

## 2018-05-27 DIAGNOSIS — C9201 Acute myeloblastic leukemia, in remission: Secondary | ICD-10-CM | POA: Diagnosis not present

## 2018-05-27 DIAGNOSIS — H40013 Open angle with borderline findings, low risk, bilateral: Secondary | ICD-10-CM | POA: Diagnosis not present

## 2018-06-03 DIAGNOSIS — C92A0 Acute myeloid leukemia with multilineage dysplasia, not having achieved remission: Secondary | ICD-10-CM | POA: Diagnosis not present

## 2018-06-03 DIAGNOSIS — Z23 Encounter for immunization: Secondary | ICD-10-CM | POA: Diagnosis not present

## 2018-06-03 DIAGNOSIS — Z9481 Bone marrow transplant status: Secondary | ICD-10-CM | POA: Diagnosis not present

## 2018-06-03 DIAGNOSIS — Z806 Family history of leukemia: Secondary | ICD-10-CM | POA: Diagnosis not present

## 2018-06-03 DIAGNOSIS — D89811 Chronic graft-versus-host disease: Secondary | ICD-10-CM | POA: Diagnosis not present

## 2018-06-03 DIAGNOSIS — C9201 Acute myeloblastic leukemia, in remission: Secondary | ICD-10-CM | POA: Diagnosis not present

## 2018-06-03 DIAGNOSIS — J329 Chronic sinusitis, unspecified: Secondary | ICD-10-CM | POA: Diagnosis not present

## 2018-06-03 DIAGNOSIS — I4891 Unspecified atrial fibrillation: Secondary | ICD-10-CM | POA: Diagnosis not present

## 2018-06-03 DIAGNOSIS — R0602 Shortness of breath: Secondary | ICD-10-CM | POA: Diagnosis not present

## 2018-06-10 DIAGNOSIS — Z9484 Stem cells transplant status: Secondary | ICD-10-CM | POA: Diagnosis not present

## 2018-06-10 DIAGNOSIS — Z5111 Encounter for antineoplastic chemotherapy: Secondary | ICD-10-CM | POA: Diagnosis not present

## 2018-06-10 DIAGNOSIS — C9201 Acute myeloblastic leukemia, in remission: Secondary | ICD-10-CM | POA: Diagnosis not present

## 2018-06-10 DIAGNOSIS — Z79899 Other long term (current) drug therapy: Secondary | ICD-10-CM | POA: Diagnosis not present

## 2018-06-11 DIAGNOSIS — Z9484 Stem cells transplant status: Secondary | ICD-10-CM | POA: Diagnosis not present

## 2018-06-11 DIAGNOSIS — Z79899 Other long term (current) drug therapy: Secondary | ICD-10-CM | POA: Diagnosis not present

## 2018-06-11 DIAGNOSIS — Z5111 Encounter for antineoplastic chemotherapy: Secondary | ICD-10-CM | POA: Diagnosis not present

## 2018-06-11 DIAGNOSIS — C9201 Acute myeloblastic leukemia, in remission: Secondary | ICD-10-CM | POA: Diagnosis not present

## 2018-06-12 DIAGNOSIS — Z79899 Other long term (current) drug therapy: Secondary | ICD-10-CM | POA: Diagnosis not present

## 2018-06-12 DIAGNOSIS — Z5111 Encounter for antineoplastic chemotherapy: Secondary | ICD-10-CM | POA: Diagnosis not present

## 2018-06-12 DIAGNOSIS — C9201 Acute myeloblastic leukemia, in remission: Secondary | ICD-10-CM | POA: Diagnosis not present

## 2018-06-13 DIAGNOSIS — Z79899 Other long term (current) drug therapy: Secondary | ICD-10-CM | POA: Diagnosis not present

## 2018-06-13 DIAGNOSIS — C9201 Acute myeloblastic leukemia, in remission: Secondary | ICD-10-CM | POA: Diagnosis not present

## 2018-06-13 DIAGNOSIS — Z5111 Encounter for antineoplastic chemotherapy: Secondary | ICD-10-CM | POA: Diagnosis not present

## 2018-06-14 DIAGNOSIS — R0602 Shortness of breath: Secondary | ICD-10-CM | POA: Diagnosis not present

## 2018-06-14 DIAGNOSIS — I252 Old myocardial infarction: Secondary | ICD-10-CM | POA: Diagnosis not present

## 2018-06-14 DIAGNOSIS — R5383 Other fatigue: Secondary | ICD-10-CM | POA: Diagnosis not present

## 2018-06-14 DIAGNOSIS — Z79899 Other long term (current) drug therapy: Secondary | ICD-10-CM | POA: Diagnosis not present

## 2018-06-14 DIAGNOSIS — G629 Polyneuropathy, unspecified: Secondary | ICD-10-CM | POA: Diagnosis not present

## 2018-06-14 DIAGNOSIS — D6189 Other specified aplastic anemias and other bone marrow failure syndromes: Secondary | ICD-10-CM | POA: Diagnosis not present

## 2018-06-14 DIAGNOSIS — I4891 Unspecified atrial fibrillation: Secondary | ICD-10-CM | POA: Diagnosis not present

## 2018-06-14 DIAGNOSIS — Z9484 Stem cells transplant status: Secondary | ICD-10-CM | POA: Diagnosis not present

## 2018-06-14 DIAGNOSIS — I482 Chronic atrial fibrillation, unspecified: Secondary | ICD-10-CM | POA: Diagnosis not present

## 2018-06-14 DIAGNOSIS — D649 Anemia, unspecified: Secondary | ICD-10-CM | POA: Diagnosis not present

## 2018-06-14 DIAGNOSIS — Z9481 Bone marrow transplant status: Secondary | ICD-10-CM | POA: Diagnosis not present

## 2018-06-14 DIAGNOSIS — C9201 Acute myeloblastic leukemia, in remission: Secondary | ICD-10-CM | POA: Diagnosis not present

## 2018-06-14 DIAGNOSIS — R4182 Altered mental status, unspecified: Secondary | ICD-10-CM | POA: Diagnosis not present

## 2018-06-14 DIAGNOSIS — Z95810 Presence of automatic (implantable) cardiac defibrillator: Secondary | ICD-10-CM | POA: Diagnosis not present

## 2018-06-14 DIAGNOSIS — Z87891 Personal history of nicotine dependence: Secondary | ICD-10-CM | POA: Diagnosis not present

## 2018-06-14 DIAGNOSIS — Z7901 Long term (current) use of anticoagulants: Secondary | ICD-10-CM | POA: Diagnosis not present

## 2018-06-17 DIAGNOSIS — C9201 Acute myeloblastic leukemia, in remission: Secondary | ICD-10-CM | POA: Diagnosis not present

## 2018-06-24 DIAGNOSIS — C9201 Acute myeloblastic leukemia, in remission: Secondary | ICD-10-CM | POA: Diagnosis not present

## 2018-07-01 DIAGNOSIS — C9201 Acute myeloblastic leukemia, in remission: Secondary | ICD-10-CM | POA: Diagnosis not present

## 2018-07-04 DIAGNOSIS — I5189 Other ill-defined heart diseases: Secondary | ICD-10-CM | POA: Diagnosis not present

## 2018-07-04 DIAGNOSIS — Z7901 Long term (current) use of anticoagulants: Secondary | ICD-10-CM | POA: Diagnosis not present

## 2018-07-04 DIAGNOSIS — C9201 Acute myeloblastic leukemia, in remission: Secondary | ICD-10-CM | POA: Diagnosis not present

## 2018-07-04 DIAGNOSIS — G4733 Obstructive sleep apnea (adult) (pediatric): Secondary | ICD-10-CM | POA: Diagnosis not present

## 2018-07-04 DIAGNOSIS — D709 Neutropenia, unspecified: Secondary | ICD-10-CM | POA: Diagnosis not present

## 2018-07-04 DIAGNOSIS — Z8619 Personal history of other infectious and parasitic diseases: Secondary | ICD-10-CM | POA: Diagnosis not present

## 2018-07-04 DIAGNOSIS — J329 Chronic sinusitis, unspecified: Secondary | ICD-10-CM | POA: Diagnosis not present

## 2018-07-04 DIAGNOSIS — C9200 Acute myeloblastic leukemia, not having achieved remission: Secondary | ICD-10-CM | POA: Diagnosis not present

## 2018-07-04 DIAGNOSIS — I482 Chronic atrial fibrillation, unspecified: Secondary | ICD-10-CM | POA: Diagnosis not present

## 2018-07-04 DIAGNOSIS — Z9989 Dependence on other enabling machines and devices: Secondary | ICD-10-CM | POA: Diagnosis not present

## 2018-07-04 DIAGNOSIS — I4891 Unspecified atrial fibrillation: Secondary | ICD-10-CM | POA: Diagnosis not present

## 2018-07-04 DIAGNOSIS — J479 Bronchiectasis, uncomplicated: Secondary | ICD-10-CM | POA: Diagnosis not present

## 2018-07-04 DIAGNOSIS — Z9484 Stem cells transplant status: Secondary | ICD-10-CM | POA: Diagnosis not present

## 2018-07-04 DIAGNOSIS — K740 Hepatic fibrosis: Secondary | ICD-10-CM | POA: Diagnosis not present

## 2018-07-04 DIAGNOSIS — Z9481 Bone marrow transplant status: Secondary | ICD-10-CM | POA: Diagnosis not present

## 2018-07-04 DIAGNOSIS — Z792 Long term (current) use of antibiotics: Secondary | ICD-10-CM | POA: Diagnosis not present

## 2018-07-08 DIAGNOSIS — C9201 Acute myeloblastic leukemia, in remission: Secondary | ICD-10-CM | POA: Diagnosis not present

## 2018-07-15 DIAGNOSIS — Z5111 Encounter for antineoplastic chemotherapy: Secondary | ICD-10-CM | POA: Diagnosis not present

## 2018-07-15 DIAGNOSIS — Z7901 Long term (current) use of anticoagulants: Secondary | ICD-10-CM | POA: Diagnosis not present

## 2018-07-15 DIAGNOSIS — Z79899 Other long term (current) drug therapy: Secondary | ICD-10-CM | POA: Diagnosis not present

## 2018-07-15 DIAGNOSIS — C9201 Acute myeloblastic leukemia, in remission: Secondary | ICD-10-CM | POA: Diagnosis not present

## 2018-07-15 DIAGNOSIS — I4891 Unspecified atrial fibrillation: Secondary | ICD-10-CM | POA: Diagnosis not present

## 2018-07-15 DIAGNOSIS — Z9484 Stem cells transplant status: Secondary | ICD-10-CM | POA: Diagnosis not present

## 2018-07-16 DIAGNOSIS — Z79899 Other long term (current) drug therapy: Secondary | ICD-10-CM | POA: Diagnosis not present

## 2018-07-16 DIAGNOSIS — Z5111 Encounter for antineoplastic chemotherapy: Secondary | ICD-10-CM | POA: Diagnosis not present

## 2018-07-16 DIAGNOSIS — C9201 Acute myeloblastic leukemia, in remission: Secondary | ICD-10-CM | POA: Diagnosis not present

## 2018-07-17 DIAGNOSIS — Z5111 Encounter for antineoplastic chemotherapy: Secondary | ICD-10-CM | POA: Diagnosis not present

## 2018-07-17 DIAGNOSIS — Z79899 Other long term (current) drug therapy: Secondary | ICD-10-CM | POA: Diagnosis not present

## 2018-07-17 DIAGNOSIS — C9201 Acute myeloblastic leukemia, in remission: Secondary | ICD-10-CM | POA: Diagnosis not present

## 2018-07-18 DIAGNOSIS — C9201 Acute myeloblastic leukemia, in remission: Secondary | ICD-10-CM | POA: Diagnosis not present

## 2018-07-18 DIAGNOSIS — Z5111 Encounter for antineoplastic chemotherapy: Secondary | ICD-10-CM | POA: Diagnosis not present

## 2018-07-18 DIAGNOSIS — Z79899 Other long term (current) drug therapy: Secondary | ICD-10-CM | POA: Diagnosis not present

## 2018-07-19 DIAGNOSIS — Z79899 Other long term (current) drug therapy: Secondary | ICD-10-CM | POA: Diagnosis not present

## 2018-07-19 DIAGNOSIS — Z5111 Encounter for antineoplastic chemotherapy: Secondary | ICD-10-CM | POA: Diagnosis not present

## 2018-07-22 DIAGNOSIS — C9201 Acute myeloblastic leukemia, in remission: Secondary | ICD-10-CM | POA: Diagnosis not present

## 2018-07-29 DIAGNOSIS — C9201 Acute myeloblastic leukemia, in remission: Secondary | ICD-10-CM | POA: Diagnosis not present

## 2018-08-05 DIAGNOSIS — C9201 Acute myeloblastic leukemia, in remission: Secondary | ICD-10-CM | POA: Diagnosis not present

## 2018-08-12 DIAGNOSIS — C9201 Acute myeloblastic leukemia, in remission: Secondary | ICD-10-CM | POA: Diagnosis not present

## 2018-08-15 DIAGNOSIS — Z7901 Long term (current) use of anticoagulants: Secondary | ICD-10-CM | POA: Diagnosis not present

## 2018-08-15 DIAGNOSIS — I482 Chronic atrial fibrillation, unspecified: Secondary | ICD-10-CM | POA: Diagnosis not present

## 2018-08-15 DIAGNOSIS — D539 Nutritional anemia, unspecified: Secondary | ICD-10-CM | POA: Diagnosis not present

## 2018-08-15 DIAGNOSIS — K740 Hepatic fibrosis: Secondary | ICD-10-CM | POA: Diagnosis not present

## 2018-08-15 DIAGNOSIS — Z79899 Other long term (current) drug therapy: Secondary | ICD-10-CM | POA: Diagnosis not present

## 2018-08-15 DIAGNOSIS — D72819 Decreased white blood cell count, unspecified: Secondary | ICD-10-CM | POA: Diagnosis not present

## 2018-08-15 DIAGNOSIS — G4733 Obstructive sleep apnea (adult) (pediatric): Secondary | ICD-10-CM | POA: Diagnosis not present

## 2018-08-15 DIAGNOSIS — C9201 Acute myeloblastic leukemia, in remission: Secondary | ICD-10-CM | POA: Diagnosis not present

## 2018-08-15 DIAGNOSIS — D89813 Graft-versus-host disease, unspecified: Secondary | ICD-10-CM | POA: Diagnosis not present

## 2018-08-15 DIAGNOSIS — J320 Chronic maxillary sinusitis: Secondary | ICD-10-CM | POA: Diagnosis not present

## 2018-08-15 DIAGNOSIS — D6189 Other specified aplastic anemias and other bone marrow failure syndromes: Secondary | ICD-10-CM | POA: Diagnosis not present

## 2018-08-15 DIAGNOSIS — Z806 Family history of leukemia: Secondary | ICD-10-CM | POA: Diagnosis not present

## 2018-08-15 DIAGNOSIS — D469 Myelodysplastic syndrome, unspecified: Secondary | ICD-10-CM | POA: Diagnosis not present

## 2018-08-15 DIAGNOSIS — J322 Chronic ethmoidal sinusitis: Secondary | ICD-10-CM | POA: Diagnosis not present

## 2018-08-15 DIAGNOSIS — D709 Neutropenia, unspecified: Secondary | ICD-10-CM | POA: Diagnosis not present

## 2018-08-15 DIAGNOSIS — J321 Chronic frontal sinusitis: Secondary | ICD-10-CM | POA: Diagnosis not present

## 2018-08-15 DIAGNOSIS — D61818 Other pancytopenia: Secondary | ICD-10-CM | POA: Diagnosis not present

## 2018-08-15 DIAGNOSIS — C9200 Acute myeloblastic leukemia, not having achieved remission: Secondary | ICD-10-CM | POA: Diagnosis not present

## 2018-08-15 DIAGNOSIS — Z9989 Dependence on other enabling machines and devices: Secondary | ICD-10-CM | POA: Diagnosis not present

## 2018-08-15 DIAGNOSIS — Z9484 Stem cells transplant status: Secondary | ICD-10-CM | POA: Diagnosis not present

## 2018-08-15 DIAGNOSIS — Z9481 Bone marrow transplant status: Secondary | ICD-10-CM | POA: Diagnosis not present

## 2018-08-16 DIAGNOSIS — C9201 Acute myeloblastic leukemia, in remission: Secondary | ICD-10-CM | POA: Diagnosis not present

## 2018-08-16 DIAGNOSIS — Z9481 Bone marrow transplant status: Secondary | ICD-10-CM | POA: Diagnosis not present

## 2018-08-16 DIAGNOSIS — D469 Myelodysplastic syndrome, unspecified: Secondary | ICD-10-CM | POA: Diagnosis not present

## 2018-08-22 DIAGNOSIS — R0602 Shortness of breath: Secondary | ICD-10-CM | POA: Diagnosis not present

## 2018-08-22 DIAGNOSIS — I509 Heart failure, unspecified: Secondary | ICD-10-CM | POA: Diagnosis not present

## 2018-08-22 DIAGNOSIS — G4733 Obstructive sleep apnea (adult) (pediatric): Secondary | ICD-10-CM | POA: Diagnosis not present

## 2018-08-22 DIAGNOSIS — T8189XA Other complications of procedures, not elsewhere classified, initial encounter: Secondary | ICD-10-CM | POA: Diagnosis not present

## 2018-08-22 DIAGNOSIS — J320 Chronic maxillary sinusitis: Secondary | ICD-10-CM | POA: Diagnosis not present

## 2018-08-22 DIAGNOSIS — I4891 Unspecified atrial fibrillation: Secondary | ICD-10-CM | POA: Diagnosis not present

## 2018-08-22 DIAGNOSIS — Z8619 Personal history of other infectious and parasitic diseases: Secondary | ICD-10-CM | POA: Diagnosis not present

## 2018-08-22 DIAGNOSIS — Z9481 Bone marrow transplant status: Secondary | ICD-10-CM | POA: Diagnosis not present

## 2018-08-22 DIAGNOSIS — I482 Chronic atrial fibrillation, unspecified: Secondary | ICD-10-CM | POA: Diagnosis not present

## 2018-08-22 DIAGNOSIS — C9200 Acute myeloblastic leukemia, not having achieved remission: Secondary | ICD-10-CM | POA: Diagnosis not present

## 2018-08-22 DIAGNOSIS — C9201 Acute myeloblastic leukemia, in remission: Secondary | ICD-10-CM | POA: Diagnosis not present

## 2018-08-22 DIAGNOSIS — Z806 Family history of leukemia: Secondary | ICD-10-CM | POA: Diagnosis not present

## 2018-08-22 DIAGNOSIS — J449 Chronic obstructive pulmonary disease, unspecified: Secondary | ICD-10-CM | POA: Diagnosis not present

## 2018-08-22 DIAGNOSIS — Z7901 Long term (current) use of anticoagulants: Secondary | ICD-10-CM | POA: Diagnosis not present

## 2018-08-26 DIAGNOSIS — T829XXA Unspecified complication of cardiac and vascular prosthetic device, implant and graft, initial encounter: Secondary | ICD-10-CM | POA: Diagnosis not present

## 2018-08-26 DIAGNOSIS — Z5111 Encounter for antineoplastic chemotherapy: Secondary | ICD-10-CM | POA: Diagnosis not present

## 2018-08-26 DIAGNOSIS — C9201 Acute myeloblastic leukemia, in remission: Secondary | ICD-10-CM | POA: Diagnosis not present

## 2018-08-26 DIAGNOSIS — Z79899 Other long term (current) drug therapy: Secondary | ICD-10-CM | POA: Diagnosis not present

## 2018-08-27 DIAGNOSIS — Z5111 Encounter for antineoplastic chemotherapy: Secondary | ICD-10-CM | POA: Diagnosis not present

## 2018-08-27 DIAGNOSIS — Z79899 Other long term (current) drug therapy: Secondary | ICD-10-CM | POA: Diagnosis not present

## 2018-08-27 DIAGNOSIS — C9201 Acute myeloblastic leukemia, in remission: Secondary | ICD-10-CM | POA: Diagnosis not present

## 2018-08-28 DIAGNOSIS — Z5111 Encounter for antineoplastic chemotherapy: Secondary | ICD-10-CM | POA: Diagnosis not present

## 2018-08-28 DIAGNOSIS — Z79899 Other long term (current) drug therapy: Secondary | ICD-10-CM | POA: Diagnosis not present

## 2018-08-28 DIAGNOSIS — C9201 Acute myeloblastic leukemia, in remission: Secondary | ICD-10-CM | POA: Diagnosis not present

## 2018-08-29 DIAGNOSIS — C9201 Acute myeloblastic leukemia, in remission: Secondary | ICD-10-CM | POA: Diagnosis not present

## 2018-08-29 DIAGNOSIS — Z79899 Other long term (current) drug therapy: Secondary | ICD-10-CM | POA: Diagnosis not present

## 2018-08-29 DIAGNOSIS — Z5111 Encounter for antineoplastic chemotherapy: Secondary | ICD-10-CM | POA: Diagnosis not present

## 2018-08-30 DIAGNOSIS — Z79899 Other long term (current) drug therapy: Secondary | ICD-10-CM | POA: Diagnosis not present

## 2018-08-30 DIAGNOSIS — Z5111 Encounter for antineoplastic chemotherapy: Secondary | ICD-10-CM | POA: Diagnosis not present

## 2018-08-30 DIAGNOSIS — C9201 Acute myeloblastic leukemia, in remission: Secondary | ICD-10-CM | POA: Diagnosis not present

## 2018-09-03 DIAGNOSIS — C9201 Acute myeloblastic leukemia, in remission: Secondary | ICD-10-CM | POA: Diagnosis not present

## 2018-09-09 DIAGNOSIS — C9201 Acute myeloblastic leukemia, in remission: Secondary | ICD-10-CM | POA: Diagnosis not present

## 2018-09-10 DIAGNOSIS — Z79899 Other long term (current) drug therapy: Secondary | ICD-10-CM | POA: Diagnosis not present

## 2018-09-10 DIAGNOSIS — Z9989 Dependence on other enabling machines and devices: Secondary | ICD-10-CM | POA: Diagnosis not present

## 2018-09-10 DIAGNOSIS — I4891 Unspecified atrial fibrillation: Secondary | ICD-10-CM | POA: Diagnosis not present

## 2018-09-10 DIAGNOSIS — Z9481 Bone marrow transplant status: Secondary | ICD-10-CM | POA: Diagnosis not present

## 2018-09-10 DIAGNOSIS — C9201 Acute myeloblastic leukemia, in remission: Secondary | ICD-10-CM | POA: Diagnosis not present

## 2018-09-10 DIAGNOSIS — Z87891 Personal history of nicotine dependence: Secondary | ICD-10-CM | POA: Diagnosis not present

## 2018-09-10 DIAGNOSIS — G4733 Obstructive sleep apnea (adult) (pediatric): Secondary | ICD-10-CM | POA: Diagnosis not present

## 2018-09-16 DIAGNOSIS — R0600 Dyspnea, unspecified: Secondary | ICD-10-CM | POA: Diagnosis not present

## 2018-09-16 DIAGNOSIS — D61810 Antineoplastic chemotherapy induced pancytopenia: Secondary | ICD-10-CM | POA: Diagnosis not present

## 2018-09-16 DIAGNOSIS — I5023 Acute on chronic systolic (congestive) heart failure: Secondary | ICD-10-CM | POA: Diagnosis not present

## 2018-09-16 DIAGNOSIS — Z79899 Other long term (current) drug therapy: Secondary | ICD-10-CM | POA: Diagnosis not present

## 2018-09-16 DIAGNOSIS — I5022 Chronic systolic (congestive) heart failure: Secondary | ICD-10-CM | POA: Diagnosis not present

## 2018-09-16 DIAGNOSIS — C9202 Acute myeloblastic leukemia, in relapse: Secondary | ICD-10-CM | POA: Diagnosis not present

## 2018-09-16 DIAGNOSIS — G4733 Obstructive sleep apnea (adult) (pediatric): Secondary | ICD-10-CM | POA: Diagnosis not present

## 2018-09-16 DIAGNOSIS — I4519 Other right bundle-branch block: Secondary | ICD-10-CM | POA: Diagnosis not present

## 2018-09-16 DIAGNOSIS — R0602 Shortness of breath: Secondary | ICD-10-CM | POA: Diagnosis not present

## 2018-09-16 DIAGNOSIS — R9431 Abnormal electrocardiogram [ECG] [EKG]: Secondary | ICD-10-CM | POA: Diagnosis not present

## 2018-09-16 DIAGNOSIS — J811 Chronic pulmonary edema: Secondary | ICD-10-CM | POA: Diagnosis not present

## 2018-09-16 DIAGNOSIS — J984 Other disorders of lung: Secondary | ICD-10-CM | POA: Diagnosis not present

## 2018-09-16 DIAGNOSIS — I071 Rheumatic tricuspid insufficiency: Secondary | ICD-10-CM | POA: Diagnosis not present

## 2018-09-16 DIAGNOSIS — R0689 Other abnormalities of breathing: Secondary | ICD-10-CM | POA: Diagnosis not present

## 2018-09-16 DIAGNOSIS — R140 Abdominal distension (gaseous): Secondary | ICD-10-CM | POA: Diagnosis not present

## 2018-09-16 DIAGNOSIS — K8020 Calculus of gallbladder without cholecystitis without obstruction: Secondary | ICD-10-CM | POA: Diagnosis not present

## 2018-09-16 DIAGNOSIS — I878 Other specified disorders of veins: Secondary | ICD-10-CM | POA: Diagnosis not present

## 2018-09-16 DIAGNOSIS — I10 Essential (primary) hypertension: Secondary | ICD-10-CM | POA: Diagnosis not present

## 2018-09-16 DIAGNOSIS — R0902 Hypoxemia: Secondary | ICD-10-CM | POA: Diagnosis not present

## 2018-09-16 DIAGNOSIS — I5043 Acute on chronic combined systolic (congestive) and diastolic (congestive) heart failure: Secondary | ICD-10-CM | POA: Diagnosis not present

## 2018-09-16 DIAGNOSIS — C9201 Acute myeloblastic leukemia, in remission: Secondary | ICD-10-CM | POA: Diagnosis not present

## 2018-09-16 DIAGNOSIS — J810 Acute pulmonary edema: Secondary | ICD-10-CM | POA: Diagnosis not present

## 2018-09-16 DIAGNOSIS — Z856 Personal history of leukemia: Secondary | ICD-10-CM | POA: Diagnosis not present

## 2018-09-16 DIAGNOSIS — I252 Old myocardial infarction: Secondary | ICD-10-CM | POA: Diagnosis not present

## 2018-09-16 DIAGNOSIS — I082 Rheumatic disorders of both aortic and tricuspid valves: Secondary | ICD-10-CM | POA: Diagnosis not present

## 2018-09-16 DIAGNOSIS — R5081 Fever presenting with conditions classified elsewhere: Secondary | ICD-10-CM | POA: Diagnosis not present

## 2018-09-16 DIAGNOSIS — Z8249 Family history of ischemic heart disease and other diseases of the circulatory system: Secondary | ICD-10-CM | POA: Diagnosis not present

## 2018-09-16 DIAGNOSIS — D509 Iron deficiency anemia, unspecified: Secondary | ICD-10-CM | POA: Diagnosis not present

## 2018-09-16 DIAGNOSIS — J121 Respiratory syncytial virus pneumonia: Secondary | ICD-10-CM | POA: Diagnosis not present

## 2018-09-16 DIAGNOSIS — I358 Other nonrheumatic aortic valve disorders: Secondary | ICD-10-CM | POA: Diagnosis not present

## 2018-09-16 DIAGNOSIS — B974 Respiratory syncytial virus as the cause of diseases classified elsewhere: Secondary | ICD-10-CM | POA: Diagnosis not present

## 2018-09-16 DIAGNOSIS — I517 Cardiomegaly: Secondary | ICD-10-CM | POA: Diagnosis not present

## 2018-09-16 DIAGNOSIS — I499 Cardiac arrhythmia, unspecified: Secondary | ICD-10-CM | POA: Diagnosis not present

## 2018-09-16 DIAGNOSIS — Z9484 Stem cells transplant status: Secondary | ICD-10-CM | POA: Diagnosis not present

## 2018-09-16 DIAGNOSIS — Z7901 Long term (current) use of anticoagulants: Secondary | ICD-10-CM | POA: Diagnosis not present

## 2018-09-16 DIAGNOSIS — I4510 Unspecified right bundle-branch block: Secondary | ICD-10-CM | POA: Diagnosis not present

## 2018-09-16 DIAGNOSIS — Z9989 Dependence on other enabling machines and devices: Secondary | ICD-10-CM | POA: Diagnosis not present

## 2018-09-16 DIAGNOSIS — A419 Sepsis, unspecified organism: Secondary | ICD-10-CM | POA: Diagnosis not present

## 2018-09-16 DIAGNOSIS — R509 Fever, unspecified: Secondary | ICD-10-CM | POA: Diagnosis not present

## 2018-09-16 DIAGNOSIS — Z9481 Bone marrow transplant status: Secondary | ICD-10-CM | POA: Diagnosis not present

## 2018-09-16 DIAGNOSIS — R42 Dizziness and giddiness: Secondary | ICD-10-CM | POA: Diagnosis not present

## 2018-09-16 DIAGNOSIS — C9200 Acute myeloblastic leukemia, not having achieved remission: Secondary | ICD-10-CM | POA: Diagnosis not present

## 2018-09-16 DIAGNOSIS — R000 Tachycardia, unspecified: Secondary | ICD-10-CM | POA: Diagnosis not present

## 2018-09-16 DIAGNOSIS — H2513 Age-related nuclear cataract, bilateral: Secondary | ICD-10-CM | POA: Diagnosis present

## 2018-09-16 DIAGNOSIS — H903 Sensorineural hearing loss, bilateral: Secondary | ICD-10-CM | POA: Diagnosis present

## 2018-09-16 DIAGNOSIS — J9601 Acute respiratory failure with hypoxia: Secondary | ICD-10-CM | POA: Diagnosis not present

## 2018-09-16 DIAGNOSIS — Z87891 Personal history of nicotine dependence: Secondary | ICD-10-CM | POA: Diagnosis not present

## 2018-09-16 DIAGNOSIS — I700 Atherosclerosis of aorta: Secondary | ICD-10-CM | POA: Diagnosis not present

## 2018-09-16 DIAGNOSIS — Z9981 Dependence on supplemental oxygen: Secondary | ICD-10-CM | POA: Diagnosis not present

## 2018-09-16 DIAGNOSIS — I4891 Unspecified atrial fibrillation: Secondary | ICD-10-CM | POA: Diagnosis not present

## 2018-09-16 DIAGNOSIS — R062 Wheezing: Secondary | ICD-10-CM | POA: Diagnosis not present

## 2018-09-16 DIAGNOSIS — Z66 Do not resuscitate: Secondary | ICD-10-CM | POA: Diagnosis not present

## 2018-09-16 DIAGNOSIS — D709 Neutropenia, unspecified: Secondary | ICD-10-CM | POA: Diagnosis not present

## 2018-09-16 DIAGNOSIS — K7460 Unspecified cirrhosis of liver: Secondary | ICD-10-CM | POA: Diagnosis not present

## 2018-09-16 DIAGNOSIS — T451X5A Adverse effect of antineoplastic and immunosuppressive drugs, initial encounter: Secondary | ICD-10-CM | POA: Diagnosis not present

## 2018-09-16 DIAGNOSIS — D708 Other neutropenia: Secondary | ICD-10-CM | POA: Diagnosis not present

## 2018-09-16 DIAGNOSIS — Z452 Encounter for adjustment and management of vascular access device: Secondary | ICD-10-CM | POA: Diagnosis not present

## 2018-09-16 DIAGNOSIS — J90 Pleural effusion, not elsewhere classified: Secondary | ICD-10-CM | POA: Diagnosis not present

## 2018-09-16 DIAGNOSIS — R918 Other nonspecific abnormal finding of lung field: Secondary | ICD-10-CM | POA: Diagnosis not present

## 2018-09-16 DIAGNOSIS — D703 Neutropenia due to infection: Secondary | ICD-10-CM | POA: Diagnosis present

## 2018-09-16 DIAGNOSIS — E878 Other disorders of electrolyte and fluid balance, not elsewhere classified: Secondary | ICD-10-CM | POA: Diagnosis not present

## 2018-09-16 DIAGNOSIS — Z806 Family history of leukemia: Secondary | ICD-10-CM | POA: Diagnosis not present

## 2018-09-16 DIAGNOSIS — I4819 Other persistent atrial fibrillation: Secondary | ICD-10-CM | POA: Diagnosis present

## 2018-09-16 DIAGNOSIS — R590 Localized enlarged lymph nodes: Secondary | ICD-10-CM | POA: Diagnosis not present

## 2018-09-16 DIAGNOSIS — I482 Chronic atrial fibrillation, unspecified: Secondary | ICD-10-CM | POA: Diagnosis not present

## 2018-09-23 DIAGNOSIS — K7460 Unspecified cirrhosis of liver: Secondary | ICD-10-CM | POA: Diagnosis not present

## 2018-09-23 DIAGNOSIS — J90 Pleural effusion, not elsewhere classified: Secondary | ICD-10-CM | POA: Diagnosis not present

## 2018-09-23 DIAGNOSIS — J984 Other disorders of lung: Secondary | ICD-10-CM | POA: Diagnosis not present

## 2018-09-23 DIAGNOSIS — D709 Neutropenia, unspecified: Secondary | ICD-10-CM | POA: Diagnosis not present

## 2018-09-23 DIAGNOSIS — R590 Localized enlarged lymph nodes: Secondary | ICD-10-CM | POA: Diagnosis not present

## 2018-09-23 DIAGNOSIS — K8020 Calculus of gallbladder without cholecystitis without obstruction: Secondary | ICD-10-CM | POA: Diagnosis not present

## 2018-09-23 DIAGNOSIS — R5081 Fever presenting with conditions classified elsewhere: Secondary | ICD-10-CM | POA: Diagnosis not present

## 2018-09-24 DIAGNOSIS — D709 Neutropenia, unspecified: Secondary | ICD-10-CM | POA: Diagnosis not present

## 2018-09-24 DIAGNOSIS — I517 Cardiomegaly: Secondary | ICD-10-CM | POA: Diagnosis not present

## 2018-09-24 DIAGNOSIS — R5081 Fever presenting with conditions classified elsewhere: Secondary | ICD-10-CM | POA: Diagnosis not present

## 2018-09-24 DIAGNOSIS — I082 Rheumatic disorders of both aortic and tricuspid valves: Secondary | ICD-10-CM | POA: Diagnosis not present

## 2018-09-24 DIAGNOSIS — I071 Rheumatic tricuspid insufficiency: Secondary | ICD-10-CM | POA: Diagnosis not present

## 2018-09-24 DIAGNOSIS — I4891 Unspecified atrial fibrillation: Secondary | ICD-10-CM | POA: Diagnosis not present

## 2018-09-24 DIAGNOSIS — I358 Other nonrheumatic aortic valve disorders: Secondary | ICD-10-CM | POA: Diagnosis not present

## 2018-10-03 DIAGNOSIS — Z9484 Stem cells transplant status: Secondary | ICD-10-CM | POA: Diagnosis not present

## 2018-10-03 DIAGNOSIS — Z9481 Bone marrow transplant status: Secondary | ICD-10-CM | POA: Diagnosis not present

## 2018-10-03 DIAGNOSIS — I4891 Unspecified atrial fibrillation: Secondary | ICD-10-CM | POA: Diagnosis not present

## 2018-10-03 DIAGNOSIS — C9201 Acute myeloblastic leukemia, in remission: Secondary | ICD-10-CM | POA: Diagnosis not present

## 2018-10-03 DIAGNOSIS — G4733 Obstructive sleep apnea (adult) (pediatric): Secondary | ICD-10-CM | POA: Diagnosis not present

## 2018-10-03 DIAGNOSIS — Z5181 Encounter for therapeutic drug level monitoring: Secondary | ICD-10-CM | POA: Diagnosis not present

## 2018-10-03 DIAGNOSIS — Z7901 Long term (current) use of anticoagulants: Secondary | ICD-10-CM | POA: Diagnosis not present

## 2018-10-08 DIAGNOSIS — C9201 Acute myeloblastic leukemia, in remission: Secondary | ICD-10-CM | POA: Diagnosis not present

## 2018-10-14 DIAGNOSIS — I509 Heart failure, unspecified: Secondary | ICD-10-CM | POA: Diagnosis not present

## 2018-10-14 DIAGNOSIS — Z9484 Stem cells transplant status: Secondary | ICD-10-CM | POA: Diagnosis not present

## 2018-10-14 DIAGNOSIS — Z87891 Personal history of nicotine dependence: Secondary | ICD-10-CM | POA: Diagnosis not present

## 2018-10-14 DIAGNOSIS — C9201 Acute myeloblastic leukemia, in remission: Secondary | ICD-10-CM | POA: Diagnosis not present

## 2018-10-14 DIAGNOSIS — Z9481 Bone marrow transplant status: Secondary | ICD-10-CM | POA: Diagnosis not present

## 2018-10-14 DIAGNOSIS — G4733 Obstructive sleep apnea (adult) (pediatric): Secondary | ICD-10-CM | POA: Diagnosis not present

## 2018-10-14 DIAGNOSIS — J989 Respiratory disorder, unspecified: Secondary | ICD-10-CM | POA: Diagnosis not present

## 2018-10-14 DIAGNOSIS — J449 Chronic obstructive pulmonary disease, unspecified: Secondary | ICD-10-CM | POA: Diagnosis not present

## 2018-10-14 DIAGNOSIS — Z5111 Encounter for antineoplastic chemotherapy: Secondary | ICD-10-CM | POA: Diagnosis not present

## 2018-10-14 DIAGNOSIS — Z9981 Dependence on supplemental oxygen: Secondary | ICD-10-CM | POA: Diagnosis not present

## 2018-10-14 DIAGNOSIS — I482 Chronic atrial fibrillation, unspecified: Secondary | ICD-10-CM | POA: Diagnosis not present

## 2018-10-14 DIAGNOSIS — Z7901 Long term (current) use of anticoagulants: Secondary | ICD-10-CM | POA: Diagnosis not present

## 2018-10-14 DIAGNOSIS — Z79899 Other long term (current) drug therapy: Secondary | ICD-10-CM | POA: Diagnosis not present

## 2018-10-15 DIAGNOSIS — Z79899 Other long term (current) drug therapy: Secondary | ICD-10-CM | POA: Diagnosis not present

## 2018-10-15 DIAGNOSIS — C9201 Acute myeloblastic leukemia, in remission: Secondary | ICD-10-CM | POA: Diagnosis not present

## 2018-10-15 DIAGNOSIS — Z5111 Encounter for antineoplastic chemotherapy: Secondary | ICD-10-CM | POA: Diagnosis not present

## 2018-10-15 DIAGNOSIS — Z9484 Stem cells transplant status: Secondary | ICD-10-CM | POA: Diagnosis not present

## 2018-10-16 DIAGNOSIS — Z5111 Encounter for antineoplastic chemotherapy: Secondary | ICD-10-CM | POA: Diagnosis not present

## 2018-10-16 DIAGNOSIS — Z79899 Other long term (current) drug therapy: Secondary | ICD-10-CM | POA: Diagnosis not present

## 2018-10-16 DIAGNOSIS — C9201 Acute myeloblastic leukemia, in remission: Secondary | ICD-10-CM | POA: Diagnosis not present

## 2018-10-16 DIAGNOSIS — Z9484 Stem cells transplant status: Secondary | ICD-10-CM | POA: Diagnosis not present

## 2018-10-17 DIAGNOSIS — Z5111 Encounter for antineoplastic chemotherapy: Secondary | ICD-10-CM | POA: Diagnosis not present

## 2018-10-17 DIAGNOSIS — C9201 Acute myeloblastic leukemia, in remission: Secondary | ICD-10-CM | POA: Diagnosis not present

## 2018-10-17 DIAGNOSIS — Z79899 Other long term (current) drug therapy: Secondary | ICD-10-CM | POA: Diagnosis not present

## 2018-10-18 DIAGNOSIS — C9201 Acute myeloblastic leukemia, in remission: Secondary | ICD-10-CM | POA: Diagnosis not present

## 2018-10-18 DIAGNOSIS — Z79899 Other long term (current) drug therapy: Secondary | ICD-10-CM | POA: Diagnosis not present

## 2018-10-18 DIAGNOSIS — Z5111 Encounter for antineoplastic chemotherapy: Secondary | ICD-10-CM | POA: Diagnosis not present

## 2018-10-21 DIAGNOSIS — C9201 Acute myeloblastic leukemia, in remission: Secondary | ICD-10-CM | POA: Diagnosis not present

## 2018-10-23 DIAGNOSIS — I4891 Unspecified atrial fibrillation: Secondary | ICD-10-CM | POA: Diagnosis not present

## 2018-10-23 DIAGNOSIS — J440 Chronic obstructive pulmonary disease with acute lower respiratory infection: Secondary | ICD-10-CM | POA: Diagnosis not present

## 2018-10-23 DIAGNOSIS — Z9989 Dependence on other enabling machines and devices: Secondary | ICD-10-CM | POA: Diagnosis not present

## 2018-10-23 DIAGNOSIS — R05 Cough: Secondary | ICD-10-CM | POA: Diagnosis not present

## 2018-10-23 DIAGNOSIS — Z87891 Personal history of nicotine dependence: Secondary | ICD-10-CM | POA: Diagnosis not present

## 2018-10-23 DIAGNOSIS — J121 Respiratory syncytial virus pneumonia: Secondary | ICD-10-CM | POA: Diagnosis not present

## 2018-10-23 DIAGNOSIS — C9201 Acute myeloblastic leukemia, in remission: Secondary | ICD-10-CM | POA: Diagnosis not present

## 2018-10-23 DIAGNOSIS — J449 Chronic obstructive pulmonary disease, unspecified: Secondary | ICD-10-CM | POA: Diagnosis not present

## 2018-10-23 DIAGNOSIS — G4733 Obstructive sleep apnea (adult) (pediatric): Secondary | ICD-10-CM | POA: Diagnosis not present

## 2018-10-28 DIAGNOSIS — C9201 Acute myeloblastic leukemia, in remission: Secondary | ICD-10-CM | POA: Diagnosis not present

## 2018-10-30 DIAGNOSIS — J810 Acute pulmonary edema: Secondary | ICD-10-CM | POA: Diagnosis not present

## 2018-10-30 DIAGNOSIS — G4733 Obstructive sleep apnea (adult) (pediatric): Secondary | ICD-10-CM | POA: Diagnosis not present

## 2018-10-30 DIAGNOSIS — J181 Lobar pneumonia, unspecified organism: Secondary | ICD-10-CM | POA: Diagnosis not present

## 2018-11-04 DIAGNOSIS — C9201 Acute myeloblastic leukemia, in remission: Secondary | ICD-10-CM | POA: Diagnosis not present

## 2018-11-11 DIAGNOSIS — C9201 Acute myeloblastic leukemia, in remission: Secondary | ICD-10-CM | POA: Diagnosis not present

## 2018-11-18 DIAGNOSIS — C9201 Acute myeloblastic leukemia, in remission: Secondary | ICD-10-CM | POA: Diagnosis not present

## 2018-11-25 DIAGNOSIS — K740 Hepatic fibrosis: Secondary | ICD-10-CM | POA: Diagnosis not present

## 2018-11-25 DIAGNOSIS — Z9484 Stem cells transplant status: Secondary | ICD-10-CM | POA: Diagnosis not present

## 2018-11-25 DIAGNOSIS — J121 Respiratory syncytial virus pneumonia: Secondary | ICD-10-CM | POA: Diagnosis not present

## 2018-11-25 DIAGNOSIS — R0600 Dyspnea, unspecified: Secondary | ICD-10-CM | POA: Diagnosis not present

## 2018-11-25 DIAGNOSIS — Z806 Family history of leukemia: Secondary | ICD-10-CM | POA: Diagnosis not present

## 2018-11-25 DIAGNOSIS — G4733 Obstructive sleep apnea (adult) (pediatric): Secondary | ICD-10-CM | POA: Diagnosis not present

## 2018-11-25 DIAGNOSIS — C9201 Acute myeloblastic leukemia, in remission: Secondary | ICD-10-CM | POA: Diagnosis not present

## 2018-11-25 DIAGNOSIS — I509 Heart failure, unspecified: Secondary | ICD-10-CM | POA: Diagnosis not present

## 2018-11-25 DIAGNOSIS — Z79899 Other long term (current) drug therapy: Secondary | ICD-10-CM | POA: Diagnosis not present

## 2018-11-25 DIAGNOSIS — I482 Chronic atrial fibrillation, unspecified: Secondary | ICD-10-CM | POA: Diagnosis not present

## 2018-11-25 DIAGNOSIS — J329 Chronic sinusitis, unspecified: Secondary | ICD-10-CM | POA: Diagnosis not present

## 2018-11-25 DIAGNOSIS — J449 Chronic obstructive pulmonary disease, unspecified: Secondary | ICD-10-CM | POA: Diagnosis not present

## 2018-11-25 DIAGNOSIS — Z7901 Long term (current) use of anticoagulants: Secondary | ICD-10-CM | POA: Diagnosis not present

## 2018-11-25 DIAGNOSIS — Z9481 Bone marrow transplant status: Secondary | ICD-10-CM | POA: Diagnosis not present

## 2018-11-26 DIAGNOSIS — Z79899 Other long term (current) drug therapy: Secondary | ICD-10-CM | POA: Diagnosis not present

## 2018-11-26 DIAGNOSIS — Z5111 Encounter for antineoplastic chemotherapy: Secondary | ICD-10-CM | POA: Diagnosis not present

## 2018-11-26 DIAGNOSIS — C9201 Acute myeloblastic leukemia, in remission: Secondary | ICD-10-CM | POA: Diagnosis not present

## 2018-11-27 DIAGNOSIS — C9201 Acute myeloblastic leukemia, in remission: Secondary | ICD-10-CM | POA: Diagnosis not present

## 2018-11-27 DIAGNOSIS — Z79899 Other long term (current) drug therapy: Secondary | ICD-10-CM | POA: Diagnosis not present

## 2018-11-27 DIAGNOSIS — Z5111 Encounter for antineoplastic chemotherapy: Secondary | ICD-10-CM | POA: Diagnosis not present

## 2018-11-28 DIAGNOSIS — C9201 Acute myeloblastic leukemia, in remission: Secondary | ICD-10-CM | POA: Diagnosis not present

## 2018-11-28 DIAGNOSIS — Z79899 Other long term (current) drug therapy: Secondary | ICD-10-CM | POA: Diagnosis not present

## 2018-11-28 DIAGNOSIS — Z5111 Encounter for antineoplastic chemotherapy: Secondary | ICD-10-CM | POA: Diagnosis not present

## 2018-11-29 DIAGNOSIS — C9201 Acute myeloblastic leukemia, in remission: Secondary | ICD-10-CM | POA: Diagnosis not present

## 2018-11-29 DIAGNOSIS — Z5111 Encounter for antineoplastic chemotherapy: Secondary | ICD-10-CM | POA: Diagnosis not present

## 2018-11-29 DIAGNOSIS — Z79899 Other long term (current) drug therapy: Secondary | ICD-10-CM | POA: Diagnosis not present

## 2018-12-02 DIAGNOSIS — C9201 Acute myeloblastic leukemia, in remission: Secondary | ICD-10-CM | POA: Diagnosis not present

## 2018-12-09 DIAGNOSIS — C9201 Acute myeloblastic leukemia, in remission: Secondary | ICD-10-CM | POA: Diagnosis not present

## 2018-12-10 DIAGNOSIS — D61810 Antineoplastic chemotherapy induced pancytopenia: Secondary | ICD-10-CM | POA: Diagnosis not present

## 2018-12-10 DIAGNOSIS — J811 Chronic pulmonary edema: Secondary | ICD-10-CM | POA: Diagnosis not present

## 2018-12-10 DIAGNOSIS — J90 Pleural effusion, not elsewhere classified: Secondary | ICD-10-CM | POA: Diagnosis not present

## 2018-12-10 DIAGNOSIS — I4819 Other persistent atrial fibrillation: Secondary | ICD-10-CM | POA: Diagnosis not present

## 2018-12-10 DIAGNOSIS — R072 Precordial pain: Secondary | ICD-10-CM | POA: Diagnosis not present

## 2018-12-10 DIAGNOSIS — R079 Chest pain, unspecified: Secondary | ICD-10-CM | POA: Diagnosis not present

## 2018-12-10 DIAGNOSIS — M546 Pain in thoracic spine: Secondary | ICD-10-CM | POA: Diagnosis not present

## 2018-12-10 DIAGNOSIS — R0789 Other chest pain: Secondary | ICD-10-CM | POA: Diagnosis not present

## 2018-12-10 DIAGNOSIS — I700 Atherosclerosis of aorta: Secondary | ICD-10-CM | POA: Diagnosis not present

## 2018-12-10 DIAGNOSIS — C9202 Acute myeloblastic leukemia, in relapse: Secondary | ICD-10-CM | POA: Diagnosis not present

## 2018-12-10 DIAGNOSIS — J810 Acute pulmonary edema: Secondary | ICD-10-CM | POA: Diagnosis not present

## 2018-12-10 DIAGNOSIS — I701 Atherosclerosis of renal artery: Secondary | ICD-10-CM | POA: Diagnosis not present

## 2018-12-10 DIAGNOSIS — D61818 Other pancytopenia: Secondary | ICD-10-CM | POA: Diagnosis not present

## 2018-12-10 DIAGNOSIS — J189 Pneumonia, unspecified organism: Secondary | ICD-10-CM | POA: Diagnosis not present

## 2018-12-16 DIAGNOSIS — C9201 Acute myeloblastic leukemia, in remission: Secondary | ICD-10-CM | POA: Diagnosis not present

## 2018-12-17 DIAGNOSIS — T451X5A Adverse effect of antineoplastic and immunosuppressive drugs, initial encounter: Secondary | ICD-10-CM | POA: Diagnosis present

## 2018-12-17 DIAGNOSIS — M65832 Other synovitis and tenosynovitis, left forearm: Secondary | ICD-10-CM | POA: Diagnosis not present

## 2018-12-17 DIAGNOSIS — G4733 Obstructive sleep apnea (adult) (pediatric): Secondary | ICD-10-CM | POA: Diagnosis present

## 2018-12-17 DIAGNOSIS — Z9484 Stem cells transplant status: Secondary | ICD-10-CM | POA: Diagnosis not present

## 2018-12-17 DIAGNOSIS — I82451 Acute embolism and thrombosis of right peroneal vein: Secondary | ICD-10-CM | POA: Diagnosis not present

## 2018-12-17 DIAGNOSIS — B9689 Other specified bacterial agents as the cause of diseases classified elsewhere: Secondary | ICD-10-CM | POA: Diagnosis not present

## 2018-12-17 DIAGNOSIS — Z881 Allergy status to other antibiotic agents status: Secondary | ICD-10-CM | POA: Diagnosis not present

## 2018-12-17 DIAGNOSIS — Z856 Personal history of leukemia: Secondary | ICD-10-CM | POA: Diagnosis not present

## 2018-12-17 DIAGNOSIS — M25532 Pain in left wrist: Secondary | ICD-10-CM | POA: Diagnosis not present

## 2018-12-17 DIAGNOSIS — Z9481 Bone marrow transplant status: Secondary | ICD-10-CM | POA: Diagnosis not present

## 2018-12-17 DIAGNOSIS — M11232 Other chondrocalcinosis, left wrist: Secondary | ICD-10-CM | POA: Diagnosis not present

## 2018-12-17 DIAGNOSIS — I4821 Permanent atrial fibrillation: Secondary | ICD-10-CM | POA: Diagnosis not present

## 2018-12-17 DIAGNOSIS — Z66 Do not resuscitate: Secondary | ICD-10-CM | POA: Diagnosis present

## 2018-12-17 DIAGNOSIS — M4643 Discitis, unspecified, cervicothoracic region: Secondary | ICD-10-CM | POA: Diagnosis present

## 2018-12-17 DIAGNOSIS — M7989 Other specified soft tissue disorders: Secondary | ICD-10-CM | POA: Diagnosis not present

## 2018-12-17 DIAGNOSIS — C9202 Acute myeloblastic leukemia, in relapse: Secondary | ICD-10-CM | POA: Diagnosis present

## 2018-12-17 DIAGNOSIS — R918 Other nonspecific abnormal finding of lung field: Secondary | ICD-10-CM | POA: Diagnosis not present

## 2018-12-17 DIAGNOSIS — M4622 Osteomyelitis of vertebra, cervical region: Secondary | ICD-10-CM | POA: Diagnosis present

## 2018-12-17 DIAGNOSIS — D703 Neutropenia due to infection: Secondary | ICD-10-CM | POA: Diagnosis not present

## 2018-12-17 DIAGNOSIS — M542 Cervicalgia: Secondary | ICD-10-CM | POA: Diagnosis not present

## 2018-12-17 DIAGNOSIS — M4644 Discitis, unspecified, thoracic region: Secondary | ICD-10-CM | POA: Diagnosis not present

## 2018-12-17 DIAGNOSIS — B441 Other pulmonary aspergillosis: Secondary | ICD-10-CM | POA: Diagnosis not present

## 2018-12-17 DIAGNOSIS — D61810 Antineoplastic chemotherapy induced pancytopenia: Secondary | ICD-10-CM | POA: Diagnosis not present

## 2018-12-17 DIAGNOSIS — D709 Neutropenia, unspecified: Secondary | ICD-10-CM | POA: Diagnosis not present

## 2018-12-17 DIAGNOSIS — M4623 Osteomyelitis of vertebra, cervicothoracic region: Secondary | ICD-10-CM | POA: Diagnosis present

## 2018-12-17 DIAGNOSIS — I517 Cardiomegaly: Secondary | ICD-10-CM | POA: Diagnosis not present

## 2018-12-17 DIAGNOSIS — J90 Pleural effusion, not elsewhere classified: Secondary | ICD-10-CM | POA: Diagnosis not present

## 2018-12-17 DIAGNOSIS — Z87891 Personal history of nicotine dependence: Secondary | ICD-10-CM | POA: Diagnosis not present

## 2018-12-17 DIAGNOSIS — M25512 Pain in left shoulder: Secondary | ICD-10-CM | POA: Diagnosis not present

## 2018-12-17 DIAGNOSIS — I454 Nonspecific intraventricular block: Secondary | ICD-10-CM | POA: Diagnosis not present

## 2018-12-17 DIAGNOSIS — D61818 Other pancytopenia: Secondary | ICD-10-CM | POA: Diagnosis not present

## 2018-12-17 DIAGNOSIS — A4181 Sepsis due to Enterococcus: Secondary | ICD-10-CM | POA: Diagnosis present

## 2018-12-17 DIAGNOSIS — J17 Pneumonia in diseases classified elsewhere: Secondary | ICD-10-CM | POA: Diagnosis not present

## 2018-12-17 DIAGNOSIS — I5020 Unspecified systolic (congestive) heart failure: Secondary | ICD-10-CM | POA: Diagnosis present

## 2018-12-17 DIAGNOSIS — T80218A Other infection due to central venous catheter, initial encounter: Secondary | ICD-10-CM | POA: Diagnosis not present

## 2018-12-17 DIAGNOSIS — Z8701 Personal history of pneumonia (recurrent): Secondary | ICD-10-CM | POA: Diagnosis not present

## 2018-12-17 DIAGNOSIS — L539 Erythematous condition, unspecified: Secondary | ICD-10-CM | POA: Diagnosis not present

## 2018-12-17 DIAGNOSIS — J181 Lobar pneumonia, unspecified organism: Secondary | ICD-10-CM | POA: Diagnosis not present

## 2018-12-17 DIAGNOSIS — I5022 Chronic systolic (congestive) heart failure: Secondary | ICD-10-CM | POA: Diagnosis not present

## 2018-12-17 DIAGNOSIS — I82441 Acute embolism and thrombosis of right tibial vein: Secondary | ICD-10-CM | POA: Diagnosis not present

## 2018-12-17 DIAGNOSIS — R7881 Bacteremia: Secondary | ICD-10-CM | POA: Diagnosis not present

## 2018-12-17 DIAGNOSIS — J449 Chronic obstructive pulmonary disease, unspecified: Secondary | ICD-10-CM | POA: Diagnosis not present

## 2018-12-17 DIAGNOSIS — C9201 Acute myeloblastic leukemia, in remission: Secondary | ICD-10-CM | POA: Diagnosis not present

## 2018-12-17 DIAGNOSIS — Z9181 History of falling: Secondary | ICD-10-CM | POA: Diagnosis not present

## 2018-12-17 DIAGNOSIS — M5144 Schmorl's nodes, thoracic region: Secondary | ICD-10-CM | POA: Diagnosis not present

## 2018-12-17 DIAGNOSIS — B952 Enterococcus as the cause of diseases classified elsewhere: Secondary | ICD-10-CM | POA: Diagnosis not present

## 2018-12-17 DIAGNOSIS — D649 Anemia, unspecified: Secondary | ICD-10-CM | POA: Diagnosis not present

## 2018-12-17 DIAGNOSIS — M546 Pain in thoracic spine: Secondary | ICD-10-CM | POA: Diagnosis not present

## 2018-12-17 DIAGNOSIS — I824Z1 Acute embolism and thrombosis of unspecified deep veins of right distal lower extremity: Secondary | ICD-10-CM | POA: Diagnosis not present

## 2018-12-17 DIAGNOSIS — Z9221 Personal history of antineoplastic chemotherapy: Secondary | ICD-10-CM | POA: Diagnosis not present

## 2018-12-17 DIAGNOSIS — L03114 Cellulitis of left upper limb: Secondary | ICD-10-CM | POA: Insufficient documentation

## 2018-12-17 DIAGNOSIS — Z806 Family history of leukemia: Secondary | ICD-10-CM | POA: Diagnosis not present

## 2018-12-17 DIAGNOSIS — Z20828 Contact with and (suspected) exposure to other viral communicable diseases: Secondary | ICD-10-CM | POA: Diagnosis present

## 2018-12-17 DIAGNOSIS — Z885 Allergy status to narcotic agent status: Secondary | ICD-10-CM | POA: Diagnosis not present

## 2018-12-17 DIAGNOSIS — Z5181 Encounter for therapeutic drug level monitoring: Secondary | ICD-10-CM | POA: Diagnosis not present

## 2018-12-17 DIAGNOSIS — D759 Disease of blood and blood-forming organs, unspecified: Secondary | ICD-10-CM | POA: Diagnosis present

## 2018-12-17 DIAGNOSIS — M868X8 Other osteomyelitis, other site: Secondary | ICD-10-CM | POA: Diagnosis not present

## 2018-12-17 DIAGNOSIS — Z452 Encounter for adjustment and management of vascular access device: Secondary | ICD-10-CM | POA: Diagnosis not present

## 2018-12-17 DIAGNOSIS — R5081 Fever presenting with conditions classified elsewhere: Secondary | ICD-10-CM | POA: Diagnosis not present

## 2018-12-17 DIAGNOSIS — Z79899 Other long term (current) drug therapy: Secondary | ICD-10-CM | POA: Diagnosis not present

## 2018-12-17 DIAGNOSIS — I482 Chronic atrial fibrillation, unspecified: Secondary | ICD-10-CM | POA: Diagnosis present

## 2018-12-17 DIAGNOSIS — J440 Chronic obstructive pulmonary disease with acute lower respiratory infection: Secondary | ICD-10-CM | POA: Diagnosis not present

## 2018-12-17 DIAGNOSIS — I82401 Acute embolism and thrombosis of unspecified deep veins of right lower extremity: Secondary | ICD-10-CM | POA: Diagnosis not present

## 2018-12-17 DIAGNOSIS — Z7901 Long term (current) use of anticoagulants: Secondary | ICD-10-CM | POA: Diagnosis not present

## 2018-12-17 DIAGNOSIS — R0602 Shortness of breath: Secondary | ICD-10-CM | POA: Diagnosis not present

## 2018-12-17 DIAGNOSIS — I959 Hypotension, unspecified: Secondary | ICD-10-CM | POA: Diagnosis not present

## 2018-12-17 DIAGNOSIS — M25432 Effusion, left wrist: Secondary | ICD-10-CM | POA: Diagnosis not present

## 2018-12-17 DIAGNOSIS — M4624 Osteomyelitis of vertebra, thoracic region: Secondary | ICD-10-CM | POA: Diagnosis present

## 2018-12-17 DIAGNOSIS — R52 Pain, unspecified: Secondary | ICD-10-CM | POA: Diagnosis not present

## 2018-12-17 DIAGNOSIS — I952 Hypotension due to drugs: Secondary | ICD-10-CM | POA: Diagnosis not present

## 2018-12-17 DIAGNOSIS — M436 Torticollis: Secondary | ICD-10-CM | POA: Diagnosis not present

## 2018-12-17 DIAGNOSIS — M25511 Pain in right shoulder: Secondary | ICD-10-CM | POA: Diagnosis not present

## 2018-12-17 DIAGNOSIS — M5382 Other specified dorsopathies, cervical region: Secondary | ICD-10-CM | POA: Diagnosis not present

## 2018-12-17 DIAGNOSIS — Z792 Long term (current) use of antibiotics: Secondary | ICD-10-CM | POA: Diagnosis not present

## 2018-12-17 DIAGNOSIS — Z95828 Presence of other vascular implants and grafts: Secondary | ICD-10-CM | POA: Diagnosis not present

## 2018-12-17 DIAGNOSIS — I4891 Unspecified atrial fibrillation: Secondary | ICD-10-CM | POA: Diagnosis not present

## 2018-12-17 DIAGNOSIS — Z9981 Dependence on supplemental oxygen: Secondary | ICD-10-CM | POA: Diagnosis not present

## 2018-12-17 DIAGNOSIS — M109 Gout, unspecified: Secondary | ICD-10-CM | POA: Diagnosis not present

## 2018-12-27 DIAGNOSIS — Z452 Encounter for adjustment and management of vascular access device: Secondary | ICD-10-CM | POA: Diagnosis not present

## 2018-12-27 DIAGNOSIS — M11232 Other chondrocalcinosis, left wrist: Secondary | ICD-10-CM | POA: Diagnosis not present

## 2018-12-27 DIAGNOSIS — M4643 Discitis, unspecified, cervicothoracic region: Secondary | ICD-10-CM | POA: Diagnosis not present

## 2018-12-27 DIAGNOSIS — T80218A Other infection due to central venous catheter, initial encounter: Secondary | ICD-10-CM | POA: Diagnosis not present

## 2018-12-27 DIAGNOSIS — A4181 Sepsis due to Enterococcus: Secondary | ICD-10-CM | POA: Diagnosis not present

## 2018-12-27 DIAGNOSIS — M4644 Discitis, unspecified, thoracic region: Secondary | ICD-10-CM | POA: Diagnosis not present

## 2018-12-28 DIAGNOSIS — J9601 Acute respiratory failure with hypoxia: Secondary | ICD-10-CM | POA: Insufficient documentation

## 2018-12-28 DIAGNOSIS — Z79899 Other long term (current) drug therapy: Secondary | ICD-10-CM | POA: Diagnosis not present

## 2018-12-28 DIAGNOSIS — R0902 Hypoxemia: Secondary | ICD-10-CM | POA: Diagnosis not present

## 2018-12-28 DIAGNOSIS — R0602 Shortness of breath: Secondary | ICD-10-CM | POA: Diagnosis not present

## 2018-12-28 DIAGNOSIS — M4644 Discitis, unspecified, thoracic region: Secondary | ICD-10-CM | POA: Diagnosis not present

## 2018-12-28 DIAGNOSIS — I482 Chronic atrial fibrillation, unspecified: Secondary | ICD-10-CM | POA: Diagnosis not present

## 2018-12-28 DIAGNOSIS — M869 Osteomyelitis, unspecified: Secondary | ICD-10-CM | POA: Diagnosis not present

## 2018-12-28 DIAGNOSIS — M11232 Other chondrocalcinosis, left wrist: Secondary | ICD-10-CM | POA: Diagnosis present

## 2018-12-28 DIAGNOSIS — M109 Gout, unspecified: Secondary | ICD-10-CM | POA: Insufficient documentation

## 2018-12-28 DIAGNOSIS — Z8619 Personal history of other infectious and parasitic diseases: Secondary | ICD-10-CM | POA: Diagnosis not present

## 2018-12-28 DIAGNOSIS — B998 Other infectious disease: Secondary | ICD-10-CM | POA: Diagnosis not present

## 2018-12-28 DIAGNOSIS — A4181 Sepsis due to Enterococcus: Secondary | ICD-10-CM | POA: Diagnosis not present

## 2018-12-28 DIAGNOSIS — I480 Paroxysmal atrial fibrillation: Secondary | ICD-10-CM | POA: Diagnosis not present

## 2018-12-28 DIAGNOSIS — K219 Gastro-esophageal reflux disease without esophagitis: Secondary | ICD-10-CM | POA: Diagnosis not present

## 2018-12-28 DIAGNOSIS — I5023 Acute on chronic systolic (congestive) heart failure: Secondary | ICD-10-CM | POA: Diagnosis not present

## 2018-12-28 DIAGNOSIS — J189 Pneumonia, unspecified organism: Secondary | ICD-10-CM | POA: Diagnosis not present

## 2018-12-28 DIAGNOSIS — Z806 Family history of leukemia: Secondary | ICD-10-CM | POA: Diagnosis not present

## 2018-12-28 DIAGNOSIS — T80218A Other infection due to central venous catheter, initial encounter: Secondary | ICD-10-CM | POA: Diagnosis not present

## 2018-12-28 DIAGNOSIS — J449 Chronic obstructive pulmonary disease, unspecified: Secondary | ICD-10-CM | POA: Diagnosis not present

## 2018-12-28 DIAGNOSIS — M4642 Discitis, unspecified, cervical region: Secondary | ICD-10-CM | POA: Diagnosis present

## 2018-12-28 DIAGNOSIS — R6520 Severe sepsis without septic shock: Secondary | ICD-10-CM | POA: Diagnosis not present

## 2018-12-28 DIAGNOSIS — Z8701 Personal history of pneumonia (recurrent): Secondary | ICD-10-CM | POA: Diagnosis not present

## 2018-12-28 DIAGNOSIS — Z20828 Contact with and (suspected) exposure to other viral communicable diseases: Secondary | ICD-10-CM | POA: Diagnosis not present

## 2018-12-28 DIAGNOSIS — I252 Old myocardial infarction: Secondary | ICD-10-CM | POA: Diagnosis not present

## 2018-12-28 DIAGNOSIS — Z881 Allergy status to other antibiotic agents status: Secondary | ICD-10-CM | POA: Diagnosis not present

## 2018-12-28 DIAGNOSIS — Z87891 Personal history of nicotine dependence: Secondary | ICD-10-CM | POA: Diagnosis not present

## 2018-12-28 DIAGNOSIS — A419 Sepsis, unspecified organism: Secondary | ICD-10-CM | POA: Diagnosis not present

## 2018-12-28 DIAGNOSIS — C9201 Acute myeloblastic leukemia, in remission: Secondary | ICD-10-CM | POA: Diagnosis present

## 2018-12-28 DIAGNOSIS — Z7901 Long term (current) use of anticoagulants: Secondary | ICD-10-CM | POA: Diagnosis not present

## 2018-12-28 DIAGNOSIS — M4640 Discitis, unspecified, site unspecified: Secondary | ICD-10-CM | POA: Diagnosis not present

## 2018-12-28 DIAGNOSIS — Z885 Allergy status to narcotic agent status: Secondary | ICD-10-CM | POA: Diagnosis not present

## 2018-12-28 DIAGNOSIS — Z209 Contact with and (suspected) exposure to unspecified communicable disease: Secondary | ICD-10-CM | POA: Diagnosis not present

## 2018-12-28 DIAGNOSIS — R7881 Bacteremia: Secondary | ICD-10-CM | POA: Diagnosis present

## 2018-12-28 DIAGNOSIS — I82409 Acute embolism and thrombosis of unspecified deep veins of unspecified lower extremity: Secondary | ICD-10-CM | POA: Diagnosis not present

## 2018-12-28 DIAGNOSIS — I4891 Unspecified atrial fibrillation: Secondary | ICD-10-CM | POA: Diagnosis not present

## 2018-12-28 DIAGNOSIS — Z888 Allergy status to other drugs, medicaments and biological substances status: Secondary | ICD-10-CM | POA: Diagnosis not present

## 2018-12-28 DIAGNOSIS — I499 Cardiac arrhythmia, unspecified: Secondary | ICD-10-CM | POA: Diagnosis not present

## 2018-12-28 DIAGNOSIS — R52 Pain, unspecified: Secondary | ICD-10-CM | POA: Diagnosis not present

## 2018-12-28 DIAGNOSIS — Z9481 Bone marrow transplant status: Secondary | ICD-10-CM | POA: Diagnosis not present

## 2018-12-28 DIAGNOSIS — M4643 Discitis, unspecified, cervicothoracic region: Secondary | ICD-10-CM | POA: Diagnosis not present

## 2018-12-28 DIAGNOSIS — B9620 Unspecified Escherichia coli [E. coli] as the cause of diseases classified elsewhere: Secondary | ICD-10-CM | POA: Diagnosis present

## 2018-12-28 DIAGNOSIS — Z452 Encounter for adjustment and management of vascular access device: Secondary | ICD-10-CM | POA: Diagnosis not present

## 2018-12-28 DIAGNOSIS — M4622 Osteomyelitis of vertebra, cervical region: Secondary | ICD-10-CM | POA: Diagnosis not present

## 2018-12-28 DIAGNOSIS — R9431 Abnormal electrocardiogram [ECG] [EKG]: Secondary | ICD-10-CM | POA: Diagnosis not present

## 2018-12-28 DIAGNOSIS — J80 Acute respiratory distress syndrome: Secondary | ICD-10-CM | POA: Diagnosis not present

## 2018-12-28 DIAGNOSIS — I110 Hypertensive heart disease with heart failure: Secondary | ICD-10-CM | POA: Diagnosis not present

## 2018-12-28 DIAGNOSIS — D72829 Elevated white blood cell count, unspecified: Secondary | ICD-10-CM | POA: Diagnosis not present

## 2018-12-28 DIAGNOSIS — Z1159 Encounter for screening for other viral diseases: Secondary | ICD-10-CM | POA: Diagnosis not present

## 2018-12-28 DIAGNOSIS — I2109 ST elevation (STEMI) myocardial infarction involving other coronary artery of anterior wall: Secondary | ICD-10-CM | POA: Diagnosis not present

## 2018-12-28 DIAGNOSIS — Z8249 Family history of ischemic heart disease and other diseases of the circulatory system: Secondary | ICD-10-CM | POA: Diagnosis not present

## 2018-12-31 DIAGNOSIS — M4644 Discitis, unspecified, thoracic region: Secondary | ICD-10-CM | POA: Diagnosis not present

## 2018-12-31 DIAGNOSIS — T80218A Other infection due to central venous catheter, initial encounter: Secondary | ICD-10-CM | POA: Diagnosis not present

## 2018-12-31 DIAGNOSIS — M11232 Other chondrocalcinosis, left wrist: Secondary | ICD-10-CM | POA: Diagnosis not present

## 2018-12-31 DIAGNOSIS — Z452 Encounter for adjustment and management of vascular access device: Secondary | ICD-10-CM | POA: Diagnosis not present

## 2018-12-31 DIAGNOSIS — A4181 Sepsis due to Enterococcus: Secondary | ICD-10-CM | POA: Diagnosis not present

## 2018-12-31 DIAGNOSIS — M4643 Discitis, unspecified, cervicothoracic region: Secondary | ICD-10-CM | POA: Diagnosis not present

## 2019-01-01 DIAGNOSIS — I5022 Chronic systolic (congestive) heart failure: Secondary | ICD-10-CM | POA: Diagnosis not present

## 2019-01-01 DIAGNOSIS — I4811 Longstanding persistent atrial fibrillation: Secondary | ICD-10-CM | POA: Diagnosis not present

## 2019-01-01 DIAGNOSIS — M79604 Pain in right leg: Secondary | ICD-10-CM | POA: Diagnosis not present

## 2019-01-01 DIAGNOSIS — C9202 Acute myeloblastic leukemia, in relapse: Secondary | ICD-10-CM | POA: Diagnosis not present

## 2019-01-01 DIAGNOSIS — J9601 Acute respiratory failure with hypoxia: Secondary | ICD-10-CM | POA: Diagnosis not present

## 2019-01-04 DIAGNOSIS — A4181 Sepsis due to Enterococcus: Secondary | ICD-10-CM | POA: Diagnosis not present

## 2019-01-04 DIAGNOSIS — T80218A Other infection due to central venous catheter, initial encounter: Secondary | ICD-10-CM | POA: Diagnosis not present

## 2019-01-04 DIAGNOSIS — M4643 Discitis, unspecified, cervicothoracic region: Secondary | ICD-10-CM | POA: Diagnosis not present

## 2019-01-04 DIAGNOSIS — M11232 Other chondrocalcinosis, left wrist: Secondary | ICD-10-CM | POA: Diagnosis not present

## 2019-01-04 DIAGNOSIS — M4644 Discitis, unspecified, thoracic region: Secondary | ICD-10-CM | POA: Diagnosis not present

## 2019-01-04 DIAGNOSIS — Z452 Encounter for adjustment and management of vascular access device: Secondary | ICD-10-CM | POA: Diagnosis not present

## 2019-01-06 DIAGNOSIS — Z452 Encounter for adjustment and management of vascular access device: Secondary | ICD-10-CM | POA: Diagnosis not present

## 2019-01-06 DIAGNOSIS — M4643 Discitis, unspecified, cervicothoracic region: Secondary | ICD-10-CM | POA: Diagnosis not present

## 2019-01-06 DIAGNOSIS — D649 Anemia, unspecified: Secondary | ICD-10-CM | POA: Diagnosis not present

## 2019-01-06 DIAGNOSIS — M4644 Discitis, unspecified, thoracic region: Secondary | ICD-10-CM | POA: Diagnosis not present

## 2019-01-06 DIAGNOSIS — M11232 Other chondrocalcinosis, left wrist: Secondary | ICD-10-CM | POA: Diagnosis not present

## 2019-01-06 DIAGNOSIS — A4181 Sepsis due to Enterococcus: Secondary | ICD-10-CM | POA: Diagnosis not present

## 2019-01-06 DIAGNOSIS — T80218A Other infection due to central venous catheter, initial encounter: Secondary | ICD-10-CM | POA: Diagnosis not present

## 2019-01-07 DIAGNOSIS — J189 Pneumonia, unspecified organism: Secondary | ICD-10-CM | POA: Diagnosis not present

## 2019-01-07 DIAGNOSIS — J90 Pleural effusion, not elsewhere classified: Secondary | ICD-10-CM | POA: Diagnosis not present

## 2019-01-07 DIAGNOSIS — I4891 Unspecified atrial fibrillation: Secondary | ICD-10-CM | POA: Diagnosis not present

## 2019-01-07 DIAGNOSIS — M25532 Pain in left wrist: Secondary | ICD-10-CM | POA: Diagnosis not present

## 2019-01-07 DIAGNOSIS — I5041 Acute combined systolic (congestive) and diastolic (congestive) heart failure: Secondary | ICD-10-CM | POA: Diagnosis not present

## 2019-01-07 DIAGNOSIS — Z87891 Personal history of nicotine dependence: Secondary | ICD-10-CM | POA: Diagnosis not present

## 2019-01-07 DIAGNOSIS — G4733 Obstructive sleep apnea (adult) (pediatric): Secondary | ICD-10-CM | POA: Diagnosis not present

## 2019-01-07 DIAGNOSIS — L03114 Cellulitis of left upper limb: Secondary | ICD-10-CM | POA: Diagnosis not present

## 2019-01-08 DIAGNOSIS — R7881 Bacteremia: Secondary | ICD-10-CM | POA: Diagnosis not present

## 2019-01-08 DIAGNOSIS — M8609 Acute hematogenous osteomyelitis, multiple sites: Secondary | ICD-10-CM | POA: Diagnosis not present

## 2019-01-08 DIAGNOSIS — M4802 Spinal stenosis, cervical region: Secondary | ICD-10-CM | POA: Diagnosis not present

## 2019-01-08 DIAGNOSIS — M50322 Other cervical disc degeneration at C5-C6 level: Secondary | ICD-10-CM | POA: Diagnosis not present

## 2019-01-09 DIAGNOSIS — Z79899 Other long term (current) drug therapy: Secondary | ICD-10-CM | POA: Diagnosis not present

## 2019-01-09 DIAGNOSIS — J449 Chronic obstructive pulmonary disease, unspecified: Secondary | ICD-10-CM | POA: Diagnosis not present

## 2019-01-09 DIAGNOSIS — I959 Hypotension, unspecified: Secondary | ICD-10-CM | POA: Diagnosis not present

## 2019-01-09 DIAGNOSIS — Z885 Allergy status to narcotic agent status: Secondary | ICD-10-CM | POA: Diagnosis not present

## 2019-01-09 DIAGNOSIS — Z7901 Long term (current) use of anticoagulants: Secondary | ICD-10-CM | POA: Diagnosis not present

## 2019-01-09 DIAGNOSIS — Z9481 Bone marrow transplant status: Secondary | ICD-10-CM | POA: Diagnosis not present

## 2019-01-09 DIAGNOSIS — J321 Chronic frontal sinusitis: Secondary | ICD-10-CM | POA: Diagnosis not present

## 2019-01-09 DIAGNOSIS — M25432 Effusion, left wrist: Secondary | ICD-10-CM | POA: Diagnosis not present

## 2019-01-09 DIAGNOSIS — M659 Synovitis and tenosynovitis, unspecified: Secondary | ICD-10-CM | POA: Diagnosis not present

## 2019-01-09 DIAGNOSIS — A0472 Enterocolitis due to Clostridium difficile, not specified as recurrent: Secondary | ICD-10-CM | POA: Diagnosis not present

## 2019-01-09 DIAGNOSIS — J90 Pleural effusion, not elsewhere classified: Secondary | ICD-10-CM | POA: Diagnosis not present

## 2019-01-09 DIAGNOSIS — Z7951 Long term (current) use of inhaled steroids: Secondary | ICD-10-CM | POA: Diagnosis not present

## 2019-01-09 DIAGNOSIS — M25532 Pain in left wrist: Secondary | ICD-10-CM | POA: Diagnosis not present

## 2019-01-09 DIAGNOSIS — M2578 Osteophyte, vertebrae: Secondary | ICD-10-CM | POA: Diagnosis not present

## 2019-01-09 DIAGNOSIS — G4733 Obstructive sleep apnea (adult) (pediatric): Secondary | ICD-10-CM | POA: Diagnosis not present

## 2019-01-09 DIAGNOSIS — M4642 Discitis, unspecified, cervical region: Secondary | ICD-10-CM | POA: Diagnosis not present

## 2019-01-09 DIAGNOSIS — I82441 Acute embolism and thrombosis of right tibial vein: Secondary | ICD-10-CM | POA: Diagnosis not present

## 2019-01-09 DIAGNOSIS — Z881 Allergy status to other antibiotic agents status: Secondary | ICD-10-CM | POA: Diagnosis not present

## 2019-01-09 DIAGNOSIS — J320 Chronic maxillary sinusitis: Secondary | ICD-10-CM | POA: Diagnosis not present

## 2019-01-09 DIAGNOSIS — C9201 Acute myeloblastic leukemia, in remission: Secondary | ICD-10-CM | POA: Diagnosis not present

## 2019-01-09 DIAGNOSIS — I509 Heart failure, unspecified: Secondary | ICD-10-CM | POA: Diagnosis not present

## 2019-01-09 DIAGNOSIS — M4622 Osteomyelitis of vertebra, cervical region: Secondary | ICD-10-CM | POA: Diagnosis not present

## 2019-01-09 DIAGNOSIS — I4821 Permanent atrial fibrillation: Secondary | ICD-10-CM | POA: Diagnosis not present

## 2019-01-09 DIAGNOSIS — M109 Gout, unspecified: Secondary | ICD-10-CM | POA: Diagnosis not present

## 2019-01-09 DIAGNOSIS — K1120 Sialoadenitis, unspecified: Secondary | ICD-10-CM | POA: Diagnosis not present

## 2019-01-09 DIAGNOSIS — Z9484 Stem cells transplant status: Secondary | ICD-10-CM | POA: Diagnosis not present

## 2019-01-09 DIAGNOSIS — L03114 Cellulitis of left upper limb: Secondary | ICD-10-CM | POA: Diagnosis not present

## 2019-01-09 DIAGNOSIS — J479 Bronchiectasis, uncomplicated: Secondary | ICD-10-CM | POA: Diagnosis not present

## 2019-01-09 DIAGNOSIS — M8620 Subacute osteomyelitis, unspecified site: Secondary | ICD-10-CM | POA: Diagnosis not present

## 2019-01-09 DIAGNOSIS — D61818 Other pancytopenia: Secondary | ICD-10-CM | POA: Diagnosis not present

## 2019-01-09 DIAGNOSIS — I4891 Unspecified atrial fibrillation: Secondary | ICD-10-CM | POA: Diagnosis not present

## 2019-01-09 DIAGNOSIS — J322 Chronic ethmoidal sinusitis: Secondary | ICD-10-CM | POA: Diagnosis not present

## 2019-01-09 DIAGNOSIS — R7881 Bacteremia: Secondary | ICD-10-CM | POA: Diagnosis not present

## 2019-01-09 DIAGNOSIS — I5023 Acute on chronic systolic (congestive) heart failure: Secondary | ICD-10-CM | POA: Diagnosis not present

## 2019-01-13 DIAGNOSIS — M11232 Other chondrocalcinosis, left wrist: Secondary | ICD-10-CM | POA: Diagnosis not present

## 2019-01-13 DIAGNOSIS — M4643 Discitis, unspecified, cervicothoracic region: Secondary | ICD-10-CM | POA: Diagnosis not present

## 2019-01-13 DIAGNOSIS — M4644 Discitis, unspecified, thoracic region: Secondary | ICD-10-CM | POA: Diagnosis not present

## 2019-01-13 DIAGNOSIS — T80218A Other infection due to central venous catheter, initial encounter: Secondary | ICD-10-CM | POA: Diagnosis not present

## 2019-01-13 DIAGNOSIS — Z452 Encounter for adjustment and management of vascular access device: Secondary | ICD-10-CM | POA: Diagnosis not present

## 2019-01-13 DIAGNOSIS — C9200 Acute myeloblastic leukemia, not having achieved remission: Secondary | ICD-10-CM | POA: Diagnosis not present

## 2019-01-13 DIAGNOSIS — A4181 Sepsis due to Enterococcus: Secondary | ICD-10-CM | POA: Diagnosis not present

## 2019-01-20 DIAGNOSIS — T80218A Other infection due to central venous catheter, initial encounter: Secondary | ICD-10-CM | POA: Diagnosis not present

## 2019-01-20 DIAGNOSIS — M4643 Discitis, unspecified, cervicothoracic region: Secondary | ICD-10-CM | POA: Diagnosis not present

## 2019-01-20 DIAGNOSIS — A4181 Sepsis due to Enterococcus: Secondary | ICD-10-CM | POA: Diagnosis not present

## 2019-01-20 DIAGNOSIS — M4644 Discitis, unspecified, thoracic region: Secondary | ICD-10-CM | POA: Diagnosis not present

## 2019-01-20 DIAGNOSIS — M11232 Other chondrocalcinosis, left wrist: Secondary | ICD-10-CM | POA: Diagnosis not present

## 2019-01-20 DIAGNOSIS — Z452 Encounter for adjustment and management of vascular access device: Secondary | ICD-10-CM | POA: Diagnosis not present

## 2019-01-26 DIAGNOSIS — M4643 Discitis, unspecified, cervicothoracic region: Secondary | ICD-10-CM | POA: Diagnosis not present

## 2019-01-26 DIAGNOSIS — C9201 Acute myeloblastic leukemia, in remission: Secondary | ICD-10-CM | POA: Diagnosis not present

## 2019-01-26 DIAGNOSIS — J17 Pneumonia in diseases classified elsewhere: Secondary | ICD-10-CM | POA: Diagnosis not present

## 2019-01-26 DIAGNOSIS — B441 Other pulmonary aspergillosis: Secondary | ICD-10-CM | POA: Diagnosis not present

## 2019-01-26 DIAGNOSIS — I5023 Acute on chronic systolic (congestive) heart failure: Secondary | ICD-10-CM | POA: Diagnosis not present

## 2019-01-26 DIAGNOSIS — D703 Neutropenia due to infection: Secondary | ICD-10-CM | POA: Diagnosis not present

## 2019-01-26 DIAGNOSIS — A4181 Sepsis due to Enterococcus: Secondary | ICD-10-CM | POA: Diagnosis not present

## 2019-01-26 DIAGNOSIS — Z792 Long term (current) use of antibiotics: Secondary | ICD-10-CM | POA: Diagnosis not present

## 2019-01-26 DIAGNOSIS — K219 Gastro-esophageal reflux disease without esophagitis: Secondary | ICD-10-CM | POA: Diagnosis not present

## 2019-01-26 DIAGNOSIS — R5081 Fever presenting with conditions classified elsewhere: Secondary | ICD-10-CM | POA: Diagnosis not present

## 2019-01-26 DIAGNOSIS — M4644 Discitis, unspecified, thoracic region: Secondary | ICD-10-CM | POA: Diagnosis not present

## 2019-01-26 DIAGNOSIS — M109 Gout, unspecified: Secondary | ICD-10-CM | POA: Diagnosis not present

## 2019-01-26 DIAGNOSIS — I4891 Unspecified atrial fibrillation: Secondary | ICD-10-CM | POA: Diagnosis not present

## 2019-01-26 DIAGNOSIS — L03114 Cellulitis of left upper limb: Secondary | ICD-10-CM | POA: Diagnosis not present

## 2019-01-26 DIAGNOSIS — I82401 Acute embolism and thrombosis of unspecified deep veins of right lower extremity: Secondary | ICD-10-CM | POA: Diagnosis not present

## 2019-01-26 DIAGNOSIS — J320 Chronic maxillary sinusitis: Secondary | ICD-10-CM | POA: Diagnosis not present

## 2019-01-26 DIAGNOSIS — J9621 Acute and chronic respiratory failure with hypoxia: Secondary | ICD-10-CM | POA: Diagnosis not present

## 2019-01-26 DIAGNOSIS — Z452 Encounter for adjustment and management of vascular access device: Secondary | ICD-10-CM | POA: Diagnosis not present

## 2019-01-26 DIAGNOSIS — D72829 Elevated white blood cell count, unspecified: Secondary | ICD-10-CM | POA: Diagnosis not present

## 2019-01-26 DIAGNOSIS — B952 Enterococcus as the cause of diseases classified elsewhere: Secondary | ICD-10-CM | POA: Diagnosis not present

## 2019-01-26 DIAGNOSIS — J440 Chronic obstructive pulmonary disease with acute lower respiratory infection: Secondary | ICD-10-CM | POA: Diagnosis not present

## 2019-01-26 DIAGNOSIS — J90 Pleural effusion, not elsewhere classified: Secondary | ICD-10-CM | POA: Diagnosis not present

## 2019-01-26 DIAGNOSIS — M11232 Other chondrocalcinosis, left wrist: Secondary | ICD-10-CM | POA: Diagnosis not present

## 2019-01-26 DIAGNOSIS — M4622 Osteomyelitis of vertebra, cervical region: Secondary | ICD-10-CM | POA: Diagnosis not present

## 2019-01-26 DIAGNOSIS — T80218A Other infection due to central venous catheter, initial encounter: Secondary | ICD-10-CM | POA: Diagnosis not present

## 2019-01-27 DIAGNOSIS — R600 Localized edema: Secondary | ICD-10-CM | POA: Diagnosis not present

## 2019-01-27 DIAGNOSIS — Z452 Encounter for adjustment and management of vascular access device: Secondary | ICD-10-CM | POA: Diagnosis not present

## 2019-01-27 DIAGNOSIS — M11232 Other chondrocalcinosis, left wrist: Secondary | ICD-10-CM | POA: Diagnosis not present

## 2019-01-27 DIAGNOSIS — M4644 Discitis, unspecified, thoracic region: Secondary | ICD-10-CM | POA: Diagnosis not present

## 2019-01-27 DIAGNOSIS — M4643 Discitis, unspecified, cervicothoracic region: Secondary | ICD-10-CM | POA: Diagnosis not present

## 2019-01-27 DIAGNOSIS — A4181 Sepsis due to Enterococcus: Secondary | ICD-10-CM | POA: Diagnosis not present

## 2019-01-27 DIAGNOSIS — T80218A Other infection due to central venous catheter, initial encounter: Secondary | ICD-10-CM | POA: Diagnosis not present

## 2019-01-27 DIAGNOSIS — Z792 Long term (current) use of antibiotics: Secondary | ICD-10-CM | POA: Diagnosis not present

## 2019-01-27 DIAGNOSIS — M8620 Subacute osteomyelitis, unspecified site: Secondary | ICD-10-CM | POA: Diagnosis not present

## 2019-01-30 DIAGNOSIS — J449 Chronic obstructive pulmonary disease, unspecified: Secondary | ICD-10-CM | POA: Diagnosis not present

## 2019-01-30 DIAGNOSIS — Z87891 Personal history of nicotine dependence: Secondary | ICD-10-CM | POA: Diagnosis not present

## 2019-01-30 DIAGNOSIS — M109 Gout, unspecified: Secondary | ICD-10-CM | POA: Diagnosis not present

## 2019-01-30 DIAGNOSIS — Z806 Family history of leukemia: Secondary | ICD-10-CM | POA: Diagnosis not present

## 2019-01-30 DIAGNOSIS — I4821 Permanent atrial fibrillation: Secondary | ICD-10-CM | POA: Diagnosis not present

## 2019-01-30 DIAGNOSIS — R0602 Shortness of breath: Secondary | ICD-10-CM | POA: Diagnosis not present

## 2019-01-30 DIAGNOSIS — I509 Heart failure, unspecified: Secondary | ICD-10-CM | POA: Diagnosis not present

## 2019-01-30 DIAGNOSIS — R7881 Bacteremia: Secondary | ICD-10-CM | POA: Diagnosis not present

## 2019-01-30 DIAGNOSIS — J479 Bronchiectasis, uncomplicated: Secondary | ICD-10-CM | POA: Diagnosis not present

## 2019-01-30 DIAGNOSIS — M8609 Acute hematogenous osteomyelitis, multiple sites: Secondary | ICD-10-CM | POA: Diagnosis not present

## 2019-01-30 DIAGNOSIS — Z792 Long term (current) use of antibiotics: Secondary | ICD-10-CM | POA: Diagnosis not present

## 2019-01-30 DIAGNOSIS — K740 Hepatic fibrosis: Secondary | ICD-10-CM | POA: Diagnosis not present

## 2019-01-30 DIAGNOSIS — D6481 Anemia due to antineoplastic chemotherapy: Secondary | ICD-10-CM | POA: Diagnosis not present

## 2019-01-30 DIAGNOSIS — Z9484 Stem cells transplant status: Secondary | ICD-10-CM | POA: Diagnosis not present

## 2019-01-30 DIAGNOSIS — Z9481 Bone marrow transplant status: Secondary | ICD-10-CM | POA: Diagnosis not present

## 2019-01-30 DIAGNOSIS — C9201 Acute myeloblastic leukemia, in remission: Secondary | ICD-10-CM | POA: Diagnosis not present

## 2019-01-30 DIAGNOSIS — M869 Osteomyelitis, unspecified: Secondary | ICD-10-CM | POA: Diagnosis not present

## 2019-01-30 DIAGNOSIS — I279 Pulmonary heart disease, unspecified: Secondary | ICD-10-CM | POA: Diagnosis not present

## 2019-01-30 DIAGNOSIS — Z7901 Long term (current) use of anticoagulants: Secondary | ICD-10-CM | POA: Diagnosis not present

## 2019-01-30 DIAGNOSIS — G4733 Obstructive sleep apnea (adult) (pediatric): Secondary | ICD-10-CM | POA: Diagnosis not present

## 2019-01-30 DIAGNOSIS — I482 Chronic atrial fibrillation, unspecified: Secondary | ICD-10-CM | POA: Diagnosis not present

## 2019-01-30 DIAGNOSIS — Z79899 Other long term (current) drug therapy: Secondary | ICD-10-CM | POA: Diagnosis not present

## 2019-02-03 DIAGNOSIS — M4643 Discitis, unspecified, cervicothoracic region: Secondary | ICD-10-CM | POA: Diagnosis not present

## 2019-02-03 DIAGNOSIS — M11232 Other chondrocalcinosis, left wrist: Secondary | ICD-10-CM | POA: Diagnosis not present

## 2019-02-03 DIAGNOSIS — T80218A Other infection due to central venous catheter, initial encounter: Secondary | ICD-10-CM | POA: Diagnosis not present

## 2019-02-03 DIAGNOSIS — A4181 Sepsis due to Enterococcus: Secondary | ICD-10-CM | POA: Diagnosis not present

## 2019-02-03 DIAGNOSIS — M4644 Discitis, unspecified, thoracic region: Secondary | ICD-10-CM | POA: Diagnosis not present

## 2019-02-03 DIAGNOSIS — Z452 Encounter for adjustment and management of vascular access device: Secondary | ICD-10-CM | POA: Diagnosis not present

## 2019-02-05 DIAGNOSIS — R911 Solitary pulmonary nodule: Secondary | ICD-10-CM | POA: Diagnosis not present

## 2019-02-05 DIAGNOSIS — J90 Pleural effusion, not elsewhere classified: Secondary | ICD-10-CM | POA: Diagnosis not present

## 2019-02-05 DIAGNOSIS — J849 Interstitial pulmonary disease, unspecified: Secondary | ICD-10-CM | POA: Diagnosis not present

## 2019-02-05 DIAGNOSIS — G4733 Obstructive sleep apnea (adult) (pediatric): Secondary | ICD-10-CM | POA: Diagnosis not present

## 2019-02-05 DIAGNOSIS — I4891 Unspecified atrial fibrillation: Secondary | ICD-10-CM | POA: Diagnosis not present

## 2019-02-05 DIAGNOSIS — I5023 Acute on chronic systolic (congestive) heart failure: Secondary | ICD-10-CM | POA: Diagnosis not present

## 2019-02-05 DIAGNOSIS — J189 Pneumonia, unspecified organism: Secondary | ICD-10-CM | POA: Diagnosis not present

## 2019-02-13 DIAGNOSIS — C9201 Acute myeloblastic leukemia, in remission: Secondary | ICD-10-CM | POA: Diagnosis not present

## 2019-02-13 DIAGNOSIS — Z1159 Encounter for screening for other viral diseases: Secondary | ICD-10-CM | POA: Diagnosis not present

## 2019-02-13 DIAGNOSIS — Z01812 Encounter for preprocedural laboratory examination: Secondary | ICD-10-CM | POA: Diagnosis not present

## 2019-02-13 DIAGNOSIS — R911 Solitary pulmonary nodule: Secondary | ICD-10-CM | POA: Diagnosis not present

## 2019-02-13 DIAGNOSIS — I4891 Unspecified atrial fibrillation: Secondary | ICD-10-CM | POA: Diagnosis not present

## 2019-02-13 DIAGNOSIS — I5023 Acute on chronic systolic (congestive) heart failure: Secondary | ICD-10-CM | POA: Diagnosis not present

## 2019-02-13 DIAGNOSIS — Z01818 Encounter for other preprocedural examination: Secondary | ICD-10-CM | POA: Diagnosis not present

## 2019-02-20 DIAGNOSIS — Z9481 Bone marrow transplant status: Secondary | ICD-10-CM | POA: Diagnosis not present

## 2019-02-20 DIAGNOSIS — I4821 Permanent atrial fibrillation: Secondary | ICD-10-CM | POA: Diagnosis not present

## 2019-02-20 DIAGNOSIS — I4891 Unspecified atrial fibrillation: Secondary | ICD-10-CM | POA: Diagnosis not present

## 2019-02-20 DIAGNOSIS — Z7901 Long term (current) use of anticoagulants: Secondary | ICD-10-CM | POA: Diagnosis not present

## 2019-02-20 DIAGNOSIS — I444 Left anterior fascicular block: Secondary | ICD-10-CM | POA: Diagnosis not present

## 2019-02-20 DIAGNOSIS — C9201 Acute myeloblastic leukemia, in remission: Secondary | ICD-10-CM | POA: Diagnosis not present

## 2019-02-27 DIAGNOSIS — C9201 Acute myeloblastic leukemia, in remission: Secondary | ICD-10-CM | POA: Diagnosis not present

## 2019-03-11 DIAGNOSIS — Z87891 Personal history of nicotine dependence: Secondary | ICD-10-CM | POA: Diagnosis not present

## 2019-03-11 DIAGNOSIS — J90 Pleural effusion, not elsewhere classified: Secondary | ICD-10-CM | POA: Diagnosis not present

## 2019-03-11 DIAGNOSIS — J9601 Acute respiratory failure with hypoxia: Secondary | ICD-10-CM | POA: Diagnosis not present

## 2019-03-11 DIAGNOSIS — R911 Solitary pulmonary nodule: Secondary | ICD-10-CM | POA: Diagnosis not present

## 2019-03-11 DIAGNOSIS — J189 Pneumonia, unspecified organism: Secondary | ICD-10-CM | POA: Diagnosis not present

## 2019-03-11 DIAGNOSIS — G4733 Obstructive sleep apnea (adult) (pediatric): Secondary | ICD-10-CM | POA: Diagnosis not present

## 2019-03-11 DIAGNOSIS — I4891 Unspecified atrial fibrillation: Secondary | ICD-10-CM | POA: Diagnosis not present

## 2019-03-11 DIAGNOSIS — Z8701 Personal history of pneumonia (recurrent): Secondary | ICD-10-CM | POA: Diagnosis not present

## 2019-03-12 DIAGNOSIS — D89813 Graft-versus-host disease, unspecified: Secondary | ICD-10-CM | POA: Diagnosis not present

## 2019-03-12 DIAGNOSIS — J189 Pneumonia, unspecified organism: Secondary | ICD-10-CM | POA: Diagnosis not present

## 2019-03-12 DIAGNOSIS — Z20828 Contact with and (suspected) exposure to other viral communicable diseases: Secondary | ICD-10-CM | POA: Diagnosis not present

## 2019-03-12 DIAGNOSIS — Z01812 Encounter for preprocedural laboratory examination: Secondary | ICD-10-CM | POA: Diagnosis not present

## 2019-03-12 DIAGNOSIS — J90 Pleural effusion, not elsewhere classified: Secondary | ICD-10-CM | POA: Diagnosis not present

## 2019-03-13 ENCOUNTER — Other Ambulatory Visit: Payer: Self-pay

## 2019-03-13 DIAGNOSIS — Z9481 Bone marrow transplant status: Secondary | ICD-10-CM | POA: Diagnosis not present

## 2019-03-13 DIAGNOSIS — C9201 Acute myeloblastic leukemia, in remission: Secondary | ICD-10-CM | POA: Diagnosis not present

## 2019-03-14 DIAGNOSIS — R918 Other nonspecific abnormal finding of lung field: Secondary | ICD-10-CM | POA: Diagnosis not present

## 2019-03-14 DIAGNOSIS — J129 Viral pneumonia, unspecified: Secondary | ICD-10-CM | POA: Diagnosis not present

## 2019-03-14 DIAGNOSIS — J189 Pneumonia, unspecified organism: Secondary | ICD-10-CM | POA: Diagnosis not present

## 2019-03-14 DIAGNOSIS — J90 Pleural effusion, not elsewhere classified: Secondary | ICD-10-CM | POA: Diagnosis not present

## 2019-03-14 DIAGNOSIS — J188 Other pneumonia, unspecified organism: Secondary | ICD-10-CM | POA: Diagnosis not present

## 2019-03-14 DIAGNOSIS — R911 Solitary pulmonary nodule: Secondary | ICD-10-CM | POA: Diagnosis not present

## 2019-03-14 DIAGNOSIS — J159 Unspecified bacterial pneumonia: Secondary | ICD-10-CM | POA: Diagnosis not present

## 2019-03-18 DIAGNOSIS — Z006 Encounter for examination for normal comparison and control in clinical research program: Secondary | ICD-10-CM | POA: Diagnosis not present

## 2019-03-18 DIAGNOSIS — I4891 Unspecified atrial fibrillation: Secondary | ICD-10-CM | POA: Diagnosis not present

## 2019-03-18 DIAGNOSIS — Z9481 Bone marrow transplant status: Secondary | ICD-10-CM | POA: Diagnosis not present

## 2019-03-18 DIAGNOSIS — I4821 Permanent atrial fibrillation: Secondary | ICD-10-CM | POA: Diagnosis not present

## 2019-03-18 DIAGNOSIS — Z87891 Personal history of nicotine dependence: Secondary | ICD-10-CM | POA: Diagnosis not present

## 2019-03-18 DIAGNOSIS — J90 Pleural effusion, not elsewhere classified: Secondary | ICD-10-CM | POA: Diagnosis not present

## 2019-03-18 DIAGNOSIS — Z950 Presence of cardiac pacemaker: Secondary | ICD-10-CM | POA: Insufficient documentation

## 2019-03-18 DIAGNOSIS — I442 Atrioventricular block, complete: Secondary | ICD-10-CM | POA: Diagnosis not present

## 2019-03-18 DIAGNOSIS — I447 Left bundle-branch block, unspecified: Secondary | ICD-10-CM | POA: Diagnosis not present

## 2019-03-18 DIAGNOSIS — Z7901 Long term (current) use of anticoagulants: Secondary | ICD-10-CM | POA: Diagnosis not present

## 2019-03-18 DIAGNOSIS — I252 Old myocardial infarction: Secondary | ICD-10-CM | POA: Diagnosis not present

## 2019-03-18 DIAGNOSIS — C9201 Acute myeloblastic leukemia, in remission: Secondary | ICD-10-CM | POA: Diagnosis not present

## 2019-03-19 DIAGNOSIS — C9201 Acute myeloblastic leukemia, in remission: Secondary | ICD-10-CM | POA: Diagnosis not present

## 2019-03-19 DIAGNOSIS — Z006 Encounter for examination for normal comparison and control in clinical research program: Secondary | ICD-10-CM | POA: Diagnosis not present

## 2019-03-19 DIAGNOSIS — Z950 Presence of cardiac pacemaker: Secondary | ICD-10-CM | POA: Diagnosis not present

## 2019-03-19 DIAGNOSIS — I4819 Other persistent atrial fibrillation: Secondary | ICD-10-CM | POA: Diagnosis not present

## 2019-03-19 DIAGNOSIS — Z9889 Other specified postprocedural states: Secondary | ICD-10-CM | POA: Diagnosis not present

## 2019-03-19 DIAGNOSIS — I447 Left bundle-branch block, unspecified: Secondary | ICD-10-CM | POA: Diagnosis not present

## 2019-03-19 DIAGNOSIS — I252 Old myocardial infarction: Secondary | ICD-10-CM | POA: Diagnosis not present

## 2019-03-19 DIAGNOSIS — I4821 Permanent atrial fibrillation: Secondary | ICD-10-CM | POA: Diagnosis not present

## 2019-03-19 DIAGNOSIS — Z87891 Personal history of nicotine dependence: Secondary | ICD-10-CM | POA: Diagnosis not present

## 2019-03-19 DIAGNOSIS — I498 Other specified cardiac arrhythmias: Secondary | ICD-10-CM | POA: Diagnosis not present

## 2019-03-19 DIAGNOSIS — J90 Pleural effusion, not elsewhere classified: Secondary | ICD-10-CM | POA: Diagnosis not present

## 2019-03-20 DIAGNOSIS — M109 Gout, unspecified: Secondary | ICD-10-CM | POA: Diagnosis not present

## 2019-03-20 DIAGNOSIS — Z87891 Personal history of nicotine dependence: Secondary | ICD-10-CM | POA: Diagnosis not present

## 2019-03-20 DIAGNOSIS — R0602 Shortness of breath: Secondary | ICD-10-CM | POA: Diagnosis not present

## 2019-03-20 DIAGNOSIS — J479 Bronchiectasis, uncomplicated: Secondary | ICD-10-CM | POA: Diagnosis not present

## 2019-03-20 DIAGNOSIS — I509 Heart failure, unspecified: Secondary | ICD-10-CM | POA: Diagnosis not present

## 2019-03-20 DIAGNOSIS — Z806 Family history of leukemia: Secondary | ICD-10-CM | POA: Diagnosis not present

## 2019-03-20 DIAGNOSIS — J449 Chronic obstructive pulmonary disease, unspecified: Secondary | ICD-10-CM | POA: Diagnosis not present

## 2019-03-20 DIAGNOSIS — I5023 Acute on chronic systolic (congestive) heart failure: Secondary | ICD-10-CM | POA: Diagnosis not present

## 2019-03-20 DIAGNOSIS — I482 Chronic atrial fibrillation, unspecified: Secondary | ICD-10-CM | POA: Diagnosis not present

## 2019-03-20 DIAGNOSIS — Z9481 Bone marrow transplant status: Secondary | ICD-10-CM | POA: Diagnosis not present

## 2019-03-20 DIAGNOSIS — Z9484 Stem cells transplant status: Secondary | ICD-10-CM | POA: Diagnosis not present

## 2019-03-20 DIAGNOSIS — C9201 Acute myeloblastic leukemia, in remission: Secondary | ICD-10-CM | POA: Diagnosis not present

## 2019-03-20 DIAGNOSIS — G4733 Obstructive sleep apnea (adult) (pediatric): Secondary | ICD-10-CM | POA: Diagnosis not present

## 2019-03-20 DIAGNOSIS — Z950 Presence of cardiac pacemaker: Secondary | ICD-10-CM | POA: Diagnosis not present

## 2019-03-28 DIAGNOSIS — J90 Pleural effusion, not elsewhere classified: Secondary | ICD-10-CM | POA: Diagnosis not present

## 2019-03-28 DIAGNOSIS — R911 Solitary pulmonary nodule: Secondary | ICD-10-CM | POA: Diagnosis not present

## 2019-03-28 DIAGNOSIS — Z01812 Encounter for preprocedural laboratory examination: Secondary | ICD-10-CM | POA: Diagnosis not present

## 2019-03-28 DIAGNOSIS — J9601 Acute respiratory failure with hypoxia: Secondary | ICD-10-CM | POA: Diagnosis not present

## 2019-03-28 DIAGNOSIS — R0602 Shortness of breath: Secondary | ICD-10-CM | POA: Diagnosis not present

## 2019-03-28 DIAGNOSIS — Z20828 Contact with and (suspected) exposure to other viral communicable diseases: Secondary | ICD-10-CM | POA: Diagnosis not present

## 2019-03-28 DIAGNOSIS — Z1159 Encounter for screening for other viral diseases: Secondary | ICD-10-CM | POA: Diagnosis not present

## 2019-03-28 DIAGNOSIS — I4891 Unspecified atrial fibrillation: Secondary | ICD-10-CM | POA: Diagnosis not present

## 2019-04-01 DIAGNOSIS — Z950 Presence of cardiac pacemaker: Secondary | ICD-10-CM | POA: Diagnosis not present

## 2019-04-01 DIAGNOSIS — J9811 Atelectasis: Secondary | ICD-10-CM | POA: Diagnosis not present

## 2019-04-01 DIAGNOSIS — J90 Pleural effusion, not elsewhere classified: Secondary | ICD-10-CM | POA: Diagnosis not present

## 2019-04-01 DIAGNOSIS — I4891 Unspecified atrial fibrillation: Secondary | ICD-10-CM | POA: Diagnosis not present

## 2019-04-01 DIAGNOSIS — R0602 Shortness of breath: Secondary | ICD-10-CM | POA: Diagnosis not present

## 2019-04-01 DIAGNOSIS — Z48812 Encounter for surgical aftercare following surgery on the circulatory system: Secondary | ICD-10-CM | POA: Diagnosis not present

## 2019-04-01 DIAGNOSIS — Z45018 Encounter for adjustment and management of other part of cardiac pacemaker: Secondary | ICD-10-CM | POA: Diagnosis not present

## 2019-04-01 DIAGNOSIS — Z4509 Encounter for adjustment and management of other cardiac device: Secondary | ICD-10-CM | POA: Diagnosis not present

## 2019-04-02 DIAGNOSIS — Z9481 Bone marrow transplant status: Secondary | ICD-10-CM | POA: Diagnosis not present

## 2019-04-02 DIAGNOSIS — C9201 Acute myeloblastic leukemia, in remission: Secondary | ICD-10-CM | POA: Diagnosis not present

## 2019-04-03 DIAGNOSIS — R911 Solitary pulmonary nodule: Secondary | ICD-10-CM | POA: Diagnosis not present

## 2019-04-03 DIAGNOSIS — C3492 Malignant neoplasm of unspecified part of left bronchus or lung: Secondary | ICD-10-CM | POA: Diagnosis not present

## 2019-04-10 DIAGNOSIS — C3492 Malignant neoplasm of unspecified part of left bronchus or lung: Secondary | ICD-10-CM | POA: Insufficient documentation

## 2019-04-10 DIAGNOSIS — J189 Pneumonia, unspecified organism: Secondary | ICD-10-CM | POA: Diagnosis not present

## 2019-04-10 DIAGNOSIS — Z87891 Personal history of nicotine dependence: Secondary | ICD-10-CM | POA: Diagnosis not present

## 2019-04-10 DIAGNOSIS — Z8701 Personal history of pneumonia (recurrent): Secondary | ICD-10-CM | POA: Diagnosis not present

## 2019-04-10 DIAGNOSIS — R0609 Other forms of dyspnea: Secondary | ICD-10-CM | POA: Diagnosis not present

## 2019-04-10 DIAGNOSIS — G4733 Obstructive sleep apnea (adult) (pediatric): Secondary | ICD-10-CM | POA: Diagnosis not present

## 2019-04-10 DIAGNOSIS — I5023 Acute on chronic systolic (congestive) heart failure: Secondary | ICD-10-CM | POA: Diagnosis not present

## 2019-04-10 DIAGNOSIS — I4821 Permanent atrial fibrillation: Secondary | ICD-10-CM | POA: Diagnosis not present

## 2019-04-10 DIAGNOSIS — I4891 Unspecified atrial fibrillation: Secondary | ICD-10-CM | POA: Diagnosis not present

## 2019-04-10 DIAGNOSIS — R590 Localized enlarged lymph nodes: Secondary | ICD-10-CM | POA: Diagnosis not present

## 2019-04-10 DIAGNOSIS — Z7901 Long term (current) use of anticoagulants: Secondary | ICD-10-CM | POA: Diagnosis not present

## 2019-04-18 DIAGNOSIS — R933 Abnormal findings on diagnostic imaging of other parts of digestive tract: Secondary | ICD-10-CM | POA: Diagnosis not present

## 2019-04-18 DIAGNOSIS — J90 Pleural effusion, not elsewhere classified: Secondary | ICD-10-CM | POA: Diagnosis not present

## 2019-04-18 DIAGNOSIS — C3412 Malignant neoplasm of upper lobe, left bronchus or lung: Secondary | ICD-10-CM | POA: Diagnosis not present

## 2019-04-30 DIAGNOSIS — Z20828 Contact with and (suspected) exposure to other viral communicable diseases: Secondary | ICD-10-CM | POA: Diagnosis not present

## 2019-04-30 DIAGNOSIS — J90 Pleural effusion, not elsewhere classified: Secondary | ICD-10-CM | POA: Diagnosis not present

## 2019-04-30 DIAGNOSIS — Z01812 Encounter for preprocedural laboratory examination: Secondary | ICD-10-CM | POA: Diagnosis not present

## 2019-04-30 DIAGNOSIS — I4821 Permanent atrial fibrillation: Secondary | ICD-10-CM | POA: Diagnosis not present

## 2019-04-30 DIAGNOSIS — C9201 Acute myeloblastic leukemia, in remission: Secondary | ICD-10-CM | POA: Diagnosis not present

## 2019-04-30 DIAGNOSIS — R0602 Shortness of breath: Secondary | ICD-10-CM | POA: Diagnosis not present

## 2019-05-01 DIAGNOSIS — I447 Left bundle-branch block, unspecified: Secondary | ICD-10-CM | POA: Diagnosis not present

## 2019-05-01 DIAGNOSIS — I442 Atrioventricular block, complete: Secondary | ICD-10-CM | POA: Diagnosis not present

## 2019-05-01 DIAGNOSIS — Z87891 Personal history of nicotine dependence: Secondary | ICD-10-CM | POA: Diagnosis not present

## 2019-05-01 DIAGNOSIS — C9201 Acute myeloblastic leukemia, in remission: Secondary | ICD-10-CM | POA: Diagnosis not present

## 2019-05-01 DIAGNOSIS — G4733 Obstructive sleep apnea (adult) (pediatric): Secondary | ICD-10-CM | POA: Diagnosis not present

## 2019-05-01 DIAGNOSIS — L03114 Cellulitis of left upper limb: Secondary | ICD-10-CM | POA: Diagnosis not present

## 2019-05-01 DIAGNOSIS — I97190 Other postprocedural cardiac functional disturbances following cardiac surgery: Secondary | ICD-10-CM | POA: Diagnosis not present

## 2019-05-01 DIAGNOSIS — I493 Ventricular premature depolarization: Secondary | ICD-10-CM | POA: Diagnosis not present

## 2019-05-01 DIAGNOSIS — J449 Chronic obstructive pulmonary disease, unspecified: Secondary | ICD-10-CM | POA: Diagnosis not present

## 2019-05-01 DIAGNOSIS — I5041 Acute combined systolic (congestive) and diastolic (congestive) heart failure: Secondary | ICD-10-CM | POA: Diagnosis not present

## 2019-05-01 DIAGNOSIS — I4891 Unspecified atrial fibrillation: Secondary | ICD-10-CM | POA: Diagnosis not present

## 2019-05-01 DIAGNOSIS — Z9481 Bone marrow transplant status: Secondary | ICD-10-CM | POA: Diagnosis not present

## 2019-05-01 DIAGNOSIS — M25532 Pain in left wrist: Secondary | ICD-10-CM | POA: Diagnosis not present

## 2019-05-01 DIAGNOSIS — I4821 Permanent atrial fibrillation: Secondary | ICD-10-CM | POA: Diagnosis not present

## 2019-05-01 DIAGNOSIS — Z950 Presence of cardiac pacemaker: Secondary | ICD-10-CM | POA: Diagnosis not present

## 2019-05-01 DIAGNOSIS — J90 Pleural effusion, not elsewhere classified: Secondary | ICD-10-CM | POA: Diagnosis not present

## 2019-05-01 DIAGNOSIS — J189 Pneumonia, unspecified organism: Secondary | ICD-10-CM | POA: Diagnosis not present

## 2019-05-01 DIAGNOSIS — C3492 Malignant neoplasm of unspecified part of left bronchus or lung: Secondary | ICD-10-CM | POA: Diagnosis not present

## 2019-05-02 DIAGNOSIS — Z950 Presence of cardiac pacemaker: Secondary | ICD-10-CM | POA: Diagnosis not present

## 2019-05-02 DIAGNOSIS — I442 Atrioventricular block, complete: Secondary | ICD-10-CM | POA: Diagnosis not present

## 2019-05-02 DIAGNOSIS — I97190 Other postprocedural cardiac functional disturbances following cardiac surgery: Secondary | ICD-10-CM | POA: Diagnosis not present

## 2019-05-02 DIAGNOSIS — I4821 Permanent atrial fibrillation: Secondary | ICD-10-CM | POA: Diagnosis not present

## 2019-05-02 DIAGNOSIS — J449 Chronic obstructive pulmonary disease, unspecified: Secondary | ICD-10-CM | POA: Diagnosis not present

## 2019-05-05 DIAGNOSIS — R591 Generalized enlarged lymph nodes: Secondary | ICD-10-CM | POA: Diagnosis not present

## 2019-05-05 DIAGNOSIS — C3492 Malignant neoplasm of unspecified part of left bronchus or lung: Secondary | ICD-10-CM | POA: Diagnosis not present

## 2019-05-05 DIAGNOSIS — R911 Solitary pulmonary nodule: Secondary | ICD-10-CM | POA: Diagnosis not present

## 2019-05-05 DIAGNOSIS — I499 Cardiac arrhythmia, unspecified: Secondary | ICD-10-CM | POA: Diagnosis not present

## 2019-05-05 DIAGNOSIS — Z8249 Family history of ischemic heart disease and other diseases of the circulatory system: Secondary | ICD-10-CM | POA: Diagnosis not present

## 2019-05-05 DIAGNOSIS — Z950 Presence of cardiac pacemaker: Secondary | ICD-10-CM | POA: Diagnosis not present

## 2019-05-05 DIAGNOSIS — K219 Gastro-esophageal reflux disease without esophagitis: Secondary | ICD-10-CM | POA: Diagnosis not present

## 2019-05-05 DIAGNOSIS — Z87891 Personal history of nicotine dependence: Secondary | ICD-10-CM | POA: Diagnosis not present

## 2019-05-05 DIAGNOSIS — Z20828 Contact with and (suspected) exposure to other viral communicable diseases: Secondary | ICD-10-CM | POA: Diagnosis present

## 2019-05-05 DIAGNOSIS — Z9484 Stem cells transplant status: Secondary | ICD-10-CM | POA: Diagnosis not present

## 2019-05-05 DIAGNOSIS — Z79899 Other long term (current) drug therapy: Secondary | ICD-10-CM | POA: Diagnosis not present

## 2019-05-05 DIAGNOSIS — R590 Localized enlarged lymph nodes: Secondary | ICD-10-CM | POA: Diagnosis not present

## 2019-05-05 DIAGNOSIS — J90 Pleural effusion, not elsewhere classified: Secondary | ICD-10-CM | POA: Diagnosis not present

## 2019-05-05 DIAGNOSIS — C9201 Acute myeloblastic leukemia, in remission: Secondary | ICD-10-CM | POA: Diagnosis present

## 2019-05-05 DIAGNOSIS — Z6831 Body mass index (BMI) 31.0-31.9, adult: Secondary | ICD-10-CM | POA: Diagnosis not present

## 2019-05-05 DIAGNOSIS — I5033 Acute on chronic diastolic (congestive) heart failure: Secondary | ICD-10-CM | POA: Diagnosis not present

## 2019-05-05 DIAGNOSIS — J449 Chronic obstructive pulmonary disease, unspecified: Secondary | ICD-10-CM | POA: Diagnosis present

## 2019-05-05 DIAGNOSIS — K5900 Constipation, unspecified: Secondary | ICD-10-CM | POA: Diagnosis not present

## 2019-05-05 DIAGNOSIS — R0602 Shortness of breath: Secondary | ICD-10-CM | POA: Diagnosis not present

## 2019-05-05 DIAGNOSIS — I110 Hypertensive heart disease with heart failure: Secondary | ICD-10-CM | POA: Diagnosis not present

## 2019-05-05 DIAGNOSIS — C3412 Malignant neoplasm of upper lobe, left bronchus or lung: Secondary | ICD-10-CM | POA: Diagnosis not present

## 2019-05-05 DIAGNOSIS — E669 Obesity, unspecified: Secondary | ICD-10-CM | POA: Diagnosis present

## 2019-05-05 DIAGNOSIS — I5031 Acute diastolic (congestive) heart failure: Secondary | ICD-10-CM | POA: Diagnosis not present

## 2019-05-05 DIAGNOSIS — F419 Anxiety disorder, unspecified: Secondary | ICD-10-CM | POA: Diagnosis not present

## 2019-05-05 DIAGNOSIS — G4733 Obstructive sleep apnea (adult) (pediatric): Secondary | ICD-10-CM | POA: Diagnosis not present

## 2019-05-05 DIAGNOSIS — C9200 Acute myeloblastic leukemia, not having achieved remission: Secondary | ICD-10-CM | POA: Diagnosis not present

## 2019-05-05 DIAGNOSIS — Z7901 Long term (current) use of anticoagulants: Secondary | ICD-10-CM | POA: Diagnosis not present

## 2019-05-05 DIAGNOSIS — I4891 Unspecified atrial fibrillation: Secondary | ICD-10-CM | POA: Diagnosis not present

## 2019-05-19 DIAGNOSIS — G4733 Obstructive sleep apnea (adult) (pediatric): Secondary | ICD-10-CM | POA: Diagnosis not present

## 2019-05-19 DIAGNOSIS — I482 Chronic atrial fibrillation, unspecified: Secondary | ICD-10-CM | POA: Diagnosis not present

## 2019-05-19 DIAGNOSIS — R0609 Other forms of dyspnea: Secondary | ICD-10-CM | POA: Diagnosis not present

## 2019-05-19 DIAGNOSIS — C3492 Malignant neoplasm of unspecified part of left bronchus or lung: Secondary | ICD-10-CM | POA: Diagnosis not present

## 2019-05-19 DIAGNOSIS — R911 Solitary pulmonary nodule: Secondary | ICD-10-CM | POA: Diagnosis not present

## 2019-05-19 DIAGNOSIS — Z9481 Bone marrow transplant status: Secondary | ICD-10-CM | POA: Diagnosis not present

## 2019-05-19 DIAGNOSIS — Z950 Presence of cardiac pacemaker: Secondary | ICD-10-CM | POA: Diagnosis not present

## 2019-05-19 DIAGNOSIS — J449 Chronic obstructive pulmonary disease, unspecified: Secondary | ICD-10-CM | POA: Diagnosis not present

## 2019-05-19 DIAGNOSIS — R140 Abdominal distension (gaseous): Secondary | ICD-10-CM | POA: Diagnosis not present

## 2019-05-19 DIAGNOSIS — I5023 Acute on chronic systolic (congestive) heart failure: Secondary | ICD-10-CM | POA: Diagnosis not present

## 2019-05-19 DIAGNOSIS — Z7901 Long term (current) use of anticoagulants: Secondary | ICD-10-CM | POA: Diagnosis not present

## 2019-05-19 DIAGNOSIS — C9201 Acute myeloblastic leukemia, in remission: Secondary | ICD-10-CM | POA: Diagnosis not present

## 2019-05-22 DIAGNOSIS — R079 Chest pain, unspecified: Secondary | ICD-10-CM | POA: Diagnosis not present

## 2019-05-22 DIAGNOSIS — R0602 Shortness of breath: Secondary | ICD-10-CM | POA: Diagnosis not present

## 2019-05-26 DIAGNOSIS — I509 Heart failure, unspecified: Secondary | ICD-10-CM | POA: Diagnosis not present

## 2019-05-29 DIAGNOSIS — Z79899 Other long term (current) drug therapy: Secondary | ICD-10-CM | POA: Diagnosis not present

## 2019-05-29 DIAGNOSIS — J9601 Acute respiratory failure with hypoxia: Secondary | ICD-10-CM | POA: Diagnosis not present

## 2019-05-29 DIAGNOSIS — R0602 Shortness of breath: Secondary | ICD-10-CM | POA: Diagnosis not present

## 2019-05-29 DIAGNOSIS — Z87891 Personal history of nicotine dependence: Secondary | ICD-10-CM | POA: Diagnosis not present

## 2019-05-29 DIAGNOSIS — M4624 Osteomyelitis of vertebra, thoracic region: Secondary | ICD-10-CM | POA: Diagnosis not present

## 2019-05-29 DIAGNOSIS — Z7901 Long term (current) use of anticoagulants: Secondary | ICD-10-CM | POA: Diagnosis not present

## 2019-05-29 DIAGNOSIS — D89811 Chronic graft-versus-host disease: Secondary | ICD-10-CM | POA: Diagnosis not present

## 2019-05-29 DIAGNOSIS — R7881 Bacteremia: Secondary | ICD-10-CM | POA: Diagnosis not present

## 2019-05-29 DIAGNOSIS — I5023 Acute on chronic systolic (congestive) heart failure: Secondary | ICD-10-CM | POA: Diagnosis not present

## 2019-05-29 DIAGNOSIS — R05 Cough: Secondary | ICD-10-CM | POA: Diagnosis not present

## 2019-05-29 DIAGNOSIS — J449 Chronic obstructive pulmonary disease, unspecified: Secondary | ICD-10-CM | POA: Diagnosis not present

## 2019-05-29 DIAGNOSIS — J90 Pleural effusion, not elsewhere classified: Secondary | ICD-10-CM | POA: Diagnosis not present

## 2019-05-29 DIAGNOSIS — Z7951 Long term (current) use of inhaled steroids: Secondary | ICD-10-CM | POA: Diagnosis not present

## 2019-05-29 DIAGNOSIS — C9201 Acute myeloblastic leukemia, in remission: Secondary | ICD-10-CM | POA: Diagnosis not present

## 2019-05-29 DIAGNOSIS — I509 Heart failure, unspecified: Secondary | ICD-10-CM | POA: Diagnosis not present

## 2019-06-05 DIAGNOSIS — C3492 Malignant neoplasm of unspecified part of left bronchus or lung: Secondary | ICD-10-CM | POA: Diagnosis not present

## 2019-06-05 DIAGNOSIS — J449 Chronic obstructive pulmonary disease, unspecified: Secondary | ICD-10-CM | POA: Diagnosis not present

## 2019-06-10 DIAGNOSIS — K6389 Other specified diseases of intestine: Secondary | ICD-10-CM | POA: Diagnosis not present

## 2019-06-10 DIAGNOSIS — C9020 Extramedullary plasmacytoma not having achieved remission: Secondary | ICD-10-CM | POA: Diagnosis not present

## 2019-06-10 DIAGNOSIS — C9101 Acute lymphoblastic leukemia, in remission: Secondary | ICD-10-CM | POA: Diagnosis not present

## 2019-06-10 DIAGNOSIS — C3412 Malignant neoplasm of upper lobe, left bronchus or lung: Secondary | ICD-10-CM | POA: Insufficient documentation

## 2019-06-10 DIAGNOSIS — Z87891 Personal history of nicotine dependence: Secondary | ICD-10-CM | POA: Diagnosis not present

## 2019-06-10 DIAGNOSIS — Z950 Presence of cardiac pacemaker: Secondary | ICD-10-CM | POA: Diagnosis not present

## 2019-06-10 DIAGNOSIS — Z9481 Bone marrow transplant status: Secondary | ICD-10-CM | POA: Diagnosis not present

## 2019-06-19 DIAGNOSIS — Z806 Family history of leukemia: Secondary | ICD-10-CM | POA: Diagnosis not present

## 2019-06-19 DIAGNOSIS — C3412 Malignant neoplasm of upper lobe, left bronchus or lung: Secondary | ICD-10-CM | POA: Diagnosis not present

## 2019-06-19 DIAGNOSIS — Z510 Encounter for antineoplastic radiation therapy: Secondary | ICD-10-CM | POA: Diagnosis not present

## 2019-06-19 DIAGNOSIS — C9202 Acute myeloblastic leukemia, in relapse: Secondary | ICD-10-CM | POA: Diagnosis not present

## 2019-06-24 DIAGNOSIS — Z806 Family history of leukemia: Secondary | ICD-10-CM | POA: Diagnosis not present

## 2019-06-24 DIAGNOSIS — C3412 Malignant neoplasm of upper lobe, left bronchus or lung: Secondary | ICD-10-CM | POA: Diagnosis not present

## 2019-06-24 DIAGNOSIS — Z510 Encounter for antineoplastic radiation therapy: Secondary | ICD-10-CM | POA: Diagnosis not present

## 2019-06-24 DIAGNOSIS — C9202 Acute myeloblastic leukemia, in relapse: Secondary | ICD-10-CM | POA: Diagnosis not present

## 2019-06-25 DIAGNOSIS — R933 Abnormal findings on diagnostic imaging of other parts of digestive tract: Secondary | ICD-10-CM | POA: Diagnosis not present

## 2019-06-25 DIAGNOSIS — K635 Polyp of colon: Secondary | ICD-10-CM | POA: Diagnosis not present

## 2019-06-25 DIAGNOSIS — D123 Benign neoplasm of transverse colon: Secondary | ICD-10-CM | POA: Diagnosis not present

## 2019-06-25 DIAGNOSIS — D374 Neoplasm of uncertain behavior of colon: Secondary | ICD-10-CM | POA: Diagnosis not present

## 2019-06-25 DIAGNOSIS — D125 Benign neoplasm of sigmoid colon: Secondary | ICD-10-CM | POA: Diagnosis not present

## 2019-07-04 DIAGNOSIS — C9202 Acute myeloblastic leukemia, in relapse: Secondary | ICD-10-CM | POA: Diagnosis not present

## 2019-07-04 DIAGNOSIS — Z510 Encounter for antineoplastic radiation therapy: Secondary | ICD-10-CM | POA: Diagnosis not present

## 2019-07-04 DIAGNOSIS — C3412 Malignant neoplasm of upper lobe, left bronchus or lung: Secondary | ICD-10-CM | POA: Diagnosis not present

## 2019-07-04 DIAGNOSIS — Z806 Family history of leukemia: Secondary | ICD-10-CM | POA: Diagnosis not present

## 2019-07-07 DIAGNOSIS — Z510 Encounter for antineoplastic radiation therapy: Secondary | ICD-10-CM | POA: Diagnosis not present

## 2019-07-07 DIAGNOSIS — C3412 Malignant neoplasm of upper lobe, left bronchus or lung: Secondary | ICD-10-CM | POA: Diagnosis not present

## 2019-07-07 DIAGNOSIS — C9202 Acute myeloblastic leukemia, in relapse: Secondary | ICD-10-CM | POA: Diagnosis not present

## 2019-07-07 DIAGNOSIS — Z806 Family history of leukemia: Secondary | ICD-10-CM | POA: Diagnosis not present

## 2019-07-08 DIAGNOSIS — C3412 Malignant neoplasm of upper lobe, left bronchus or lung: Secondary | ICD-10-CM | POA: Diagnosis not present

## 2019-07-08 DIAGNOSIS — C9202 Acute myeloblastic leukemia, in relapse: Secondary | ICD-10-CM | POA: Diagnosis not present

## 2019-07-08 DIAGNOSIS — Z510 Encounter for antineoplastic radiation therapy: Secondary | ICD-10-CM | POA: Diagnosis not present

## 2019-07-08 DIAGNOSIS — Z806 Family history of leukemia: Secondary | ICD-10-CM | POA: Diagnosis not present

## 2019-07-09 DIAGNOSIS — C3412 Malignant neoplasm of upper lobe, left bronchus or lung: Secondary | ICD-10-CM | POA: Diagnosis not present

## 2019-07-09 DIAGNOSIS — I4891 Unspecified atrial fibrillation: Secondary | ICD-10-CM | POA: Diagnosis not present

## 2019-07-09 DIAGNOSIS — Z45018 Encounter for adjustment and management of other part of cardiac pacemaker: Secondary | ICD-10-CM | POA: Diagnosis not present

## 2019-07-09 DIAGNOSIS — I97190 Other postprocedural cardiac functional disturbances following cardiac surgery: Secondary | ICD-10-CM | POA: Diagnosis not present

## 2019-07-09 DIAGNOSIS — Z950 Presence of cardiac pacemaker: Secondary | ICD-10-CM | POA: Diagnosis not present

## 2019-07-09 DIAGNOSIS — Z4502 Encounter for adjustment and management of automatic implantable cardiac defibrillator: Secondary | ICD-10-CM | POA: Diagnosis not present

## 2019-07-09 DIAGNOSIS — I5023 Acute on chronic systolic (congestive) heart failure: Secondary | ICD-10-CM | POA: Diagnosis not present

## 2019-07-09 DIAGNOSIS — Z510 Encounter for antineoplastic radiation therapy: Secondary | ICD-10-CM | POA: Diagnosis not present

## 2019-07-09 DIAGNOSIS — Z95810 Presence of automatic (implantable) cardiac defibrillator: Secondary | ICD-10-CM | POA: Diagnosis not present

## 2019-07-09 DIAGNOSIS — I442 Atrioventricular block, complete: Secondary | ICD-10-CM | POA: Diagnosis not present

## 2019-07-11 DIAGNOSIS — Z806 Family history of leukemia: Secondary | ICD-10-CM | POA: Diagnosis not present

## 2019-07-11 DIAGNOSIS — C9202 Acute myeloblastic leukemia, in relapse: Secondary | ICD-10-CM | POA: Diagnosis not present

## 2019-07-11 DIAGNOSIS — Z510 Encounter for antineoplastic radiation therapy: Secondary | ICD-10-CM | POA: Diagnosis not present

## 2019-07-11 DIAGNOSIS — C3412 Malignant neoplasm of upper lobe, left bronchus or lung: Secondary | ICD-10-CM | POA: Diagnosis not present

## 2019-07-23 DIAGNOSIS — Z23 Encounter for immunization: Secondary | ICD-10-CM | POA: Diagnosis not present

## 2019-08-19 DIAGNOSIS — Z9481 Bone marrow transplant status: Secondary | ICD-10-CM | POA: Diagnosis not present

## 2019-08-19 DIAGNOSIS — C9201 Acute myeloblastic leukemia, in remission: Secondary | ICD-10-CM | POA: Diagnosis not present

## 2019-08-25 DIAGNOSIS — H40013 Open angle with borderline findings, low risk, bilateral: Secondary | ICD-10-CM | POA: Diagnosis not present

## 2019-08-25 DIAGNOSIS — H04123 Dry eye syndrome of bilateral lacrimal glands: Secondary | ICD-10-CM | POA: Diagnosis not present

## 2019-08-25 DIAGNOSIS — D89813 Graft-versus-host disease, unspecified: Secondary | ICD-10-CM | POA: Diagnosis not present

## 2019-08-25 DIAGNOSIS — H25813 Combined forms of age-related cataract, bilateral: Secondary | ICD-10-CM | POA: Diagnosis not present

## 2019-08-26 DIAGNOSIS — Z9481 Bone marrow transplant status: Secondary | ICD-10-CM | POA: Diagnosis not present

## 2019-08-26 DIAGNOSIS — C9201 Acute myeloblastic leukemia, in remission: Secondary | ICD-10-CM | POA: Diagnosis not present

## 2019-09-02 DIAGNOSIS — D125 Benign neoplasm of sigmoid colon: Secondary | ICD-10-CM | POA: Diagnosis not present

## 2019-09-02 DIAGNOSIS — D123 Benign neoplasm of transverse colon: Secondary | ICD-10-CM | POA: Diagnosis not present

## 2019-09-02 DIAGNOSIS — Z888 Allergy status to other drugs, medicaments and biological substances status: Secondary | ICD-10-CM | POA: Diagnosis not present

## 2019-09-02 DIAGNOSIS — Z950 Presence of cardiac pacemaker: Secondary | ICD-10-CM | POA: Diagnosis not present

## 2019-09-04 DIAGNOSIS — I4891 Unspecified atrial fibrillation: Secondary | ICD-10-CM | POA: Diagnosis not present

## 2019-09-04 DIAGNOSIS — Z7901 Long term (current) use of anticoagulants: Secondary | ICD-10-CM | POA: Diagnosis not present

## 2019-09-04 DIAGNOSIS — I442 Atrioventricular block, complete: Secondary | ICD-10-CM | POA: Diagnosis not present

## 2019-09-04 DIAGNOSIS — Z79899 Other long term (current) drug therapy: Secondary | ICD-10-CM | POA: Diagnosis not present

## 2019-09-04 DIAGNOSIS — J449 Chronic obstructive pulmonary disease, unspecified: Secondary | ICD-10-CM | POA: Diagnosis not present

## 2019-09-04 DIAGNOSIS — I4821 Permanent atrial fibrillation: Secondary | ICD-10-CM | POA: Diagnosis not present

## 2019-09-04 DIAGNOSIS — Z950 Presence of cardiac pacemaker: Secondary | ICD-10-CM | POA: Diagnosis not present

## 2019-09-04 DIAGNOSIS — I97190 Other postprocedural cardiac functional disturbances following cardiac surgery: Secondary | ICD-10-CM | POA: Diagnosis not present

## 2019-09-06 DIAGNOSIS — R002 Palpitations: Secondary | ICD-10-CM | POA: Diagnosis not present

## 2019-09-08 DIAGNOSIS — Z9481 Bone marrow transplant status: Secondary | ICD-10-CM | POA: Diagnosis not present

## 2019-09-08 DIAGNOSIS — Z7901 Long term (current) use of anticoagulants: Secondary | ICD-10-CM | POA: Diagnosis not present

## 2019-09-08 DIAGNOSIS — C9201 Acute myeloblastic leukemia, in remission: Secondary | ICD-10-CM | POA: Diagnosis not present

## 2019-09-08 DIAGNOSIS — D469 Myelodysplastic syndrome, unspecified: Secondary | ICD-10-CM | POA: Diagnosis not present

## 2019-09-08 DIAGNOSIS — I482 Chronic atrial fibrillation, unspecified: Secondary | ICD-10-CM | POA: Diagnosis not present

## 2019-09-24 DIAGNOSIS — A419 Sepsis, unspecified organism: Secondary | ICD-10-CM | POA: Diagnosis not present

## 2019-09-24 DIAGNOSIS — R05 Cough: Secondary | ICD-10-CM | POA: Diagnosis not present

## 2019-09-24 DIAGNOSIS — D6489 Other specified anemias: Secondary | ICD-10-CM | POA: Diagnosis not present

## 2019-09-24 DIAGNOSIS — R0902 Hypoxemia: Secondary | ICD-10-CM | POA: Diagnosis not present

## 2019-09-24 DIAGNOSIS — R6521 Severe sepsis with septic shock: Secondary | ICD-10-CM | POA: Diagnosis not present

## 2019-09-24 DIAGNOSIS — R5383 Other fatigue: Secondary | ICD-10-CM | POA: Diagnosis not present

## 2019-09-24 DIAGNOSIS — R531 Weakness: Secondary | ICD-10-CM | POA: Diagnosis not present

## 2019-09-24 DIAGNOSIS — Z20822 Contact with and (suspected) exposure to covid-19: Secondary | ICD-10-CM | POA: Diagnosis not present

## 2019-09-24 DIAGNOSIS — C9201 Acute myeloblastic leukemia, in remission: Secondary | ICD-10-CM | POA: Diagnosis not present

## 2019-09-24 DIAGNOSIS — M549 Dorsalgia, unspecified: Secondary | ICD-10-CM | POA: Diagnosis not present

## 2019-09-24 DIAGNOSIS — D61818 Other pancytopenia: Secondary | ICD-10-CM | POA: Diagnosis not present

## 2019-09-24 DIAGNOSIS — D638 Anemia in other chronic diseases classified elsewhere: Secondary | ICD-10-CM | POA: Diagnosis not present

## 2019-09-24 DIAGNOSIS — I959 Hypotension, unspecified: Secondary | ICD-10-CM | POA: Diagnosis not present

## 2019-09-24 DIAGNOSIS — R0602 Shortness of breath: Secondary | ICD-10-CM | POA: Diagnosis not present

## 2019-09-24 DIAGNOSIS — R069 Unspecified abnormalities of breathing: Secondary | ICD-10-CM | POA: Diagnosis not present

## 2019-09-24 DIAGNOSIS — R918 Other nonspecific abnormal finding of lung field: Secondary | ICD-10-CM | POA: Diagnosis not present

## 2019-09-24 DIAGNOSIS — M545 Low back pain: Secondary | ICD-10-CM | POA: Diagnosis not present

## 2019-09-24 DIAGNOSIS — R0689 Other abnormalities of breathing: Secondary | ICD-10-CM | POA: Diagnosis not present

## 2019-09-24 DIAGNOSIS — J029 Acute pharyngitis, unspecified: Secondary | ICD-10-CM | POA: Diagnosis not present

## 2019-09-24 DIAGNOSIS — J90 Pleural effusion, not elsewhere classified: Secondary | ICD-10-CM | POA: Diagnosis not present

## 2019-09-25 DIAGNOSIS — Z923 Personal history of irradiation: Secondary | ICD-10-CM | POA: Diagnosis not present

## 2019-09-25 DIAGNOSIS — Z87891 Personal history of nicotine dependence: Secondary | ICD-10-CM | POA: Diagnosis not present

## 2019-09-25 DIAGNOSIS — I358 Other nonrheumatic aortic valve disorders: Secondary | ICD-10-CM | POA: Diagnosis not present

## 2019-09-25 DIAGNOSIS — C9202 Acute myeloblastic leukemia, in relapse: Secondary | ICD-10-CM | POA: Diagnosis not present

## 2019-09-25 DIAGNOSIS — J90 Pleural effusion, not elsewhere classified: Secondary | ICD-10-CM | POA: Diagnosis not present

## 2019-09-25 DIAGNOSIS — I5023 Acute on chronic systolic (congestive) heart failure: Secondary | ICD-10-CM | POA: Diagnosis present

## 2019-09-25 DIAGNOSIS — J9620 Acute and chronic respiratory failure, unspecified whether with hypoxia or hypercapnia: Secondary | ICD-10-CM | POA: Diagnosis not present

## 2019-09-25 DIAGNOSIS — J019 Acute sinusitis, unspecified: Secondary | ICD-10-CM | POA: Diagnosis not present

## 2019-09-25 DIAGNOSIS — Z9484 Stem cells transplant status: Secondary | ICD-10-CM | POA: Diagnosis not present

## 2019-09-25 DIAGNOSIS — Z950 Presence of cardiac pacemaker: Secondary | ICD-10-CM | POA: Diagnosis not present

## 2019-09-25 DIAGNOSIS — D61818 Other pancytopenia: Secondary | ICD-10-CM | POA: Diagnosis not present

## 2019-09-25 DIAGNOSIS — R519 Headache, unspecified: Secondary | ICD-10-CM | POA: Diagnosis not present

## 2019-09-25 DIAGNOSIS — G4733 Obstructive sleep apnea (adult) (pediatric): Secondary | ICD-10-CM | POA: Diagnosis present

## 2019-09-25 DIAGNOSIS — I5022 Chronic systolic (congestive) heart failure: Secondary | ICD-10-CM | POA: Diagnosis not present

## 2019-09-25 DIAGNOSIS — R0602 Shortness of breath: Secondary | ICD-10-CM | POA: Diagnosis not present

## 2019-09-25 DIAGNOSIS — E876 Hypokalemia: Secondary | ICD-10-CM | POA: Diagnosis present

## 2019-09-25 DIAGNOSIS — J9621 Acute and chronic respiratory failure with hypoxia: Secondary | ICD-10-CM | POA: Diagnosis present

## 2019-09-25 DIAGNOSIS — I351 Nonrheumatic aortic (valve) insufficiency: Secondary | ICD-10-CM | POA: Diagnosis not present

## 2019-09-25 DIAGNOSIS — J0100 Acute maxillary sinusitis, unspecified: Secondary | ICD-10-CM | POA: Diagnosis not present

## 2019-09-25 DIAGNOSIS — J449 Chronic obstructive pulmonary disease, unspecified: Secondary | ICD-10-CM | POA: Diagnosis not present

## 2019-09-25 DIAGNOSIS — I700 Atherosclerosis of aorta: Secondary | ICD-10-CM | POA: Diagnosis not present

## 2019-09-25 DIAGNOSIS — C9201 Acute myeloblastic leukemia, in remission: Secondary | ICD-10-CM | POA: Diagnosis not present

## 2019-09-25 DIAGNOSIS — M869 Osteomyelitis, unspecified: Secondary | ICD-10-CM | POA: Diagnosis present

## 2019-09-25 DIAGNOSIS — R590 Localized enlarged lymph nodes: Secondary | ICD-10-CM | POA: Diagnosis not present

## 2019-09-25 DIAGNOSIS — T451X5A Adverse effect of antineoplastic and immunosuppressive drugs, initial encounter: Secondary | ICD-10-CM | POA: Diagnosis present

## 2019-09-25 DIAGNOSIS — R6521 Severe sepsis with septic shock: Secondary | ICD-10-CM | POA: Diagnosis not present

## 2019-09-25 DIAGNOSIS — J018 Other acute sinusitis: Secondary | ICD-10-CM | POA: Diagnosis not present

## 2019-09-25 DIAGNOSIS — I498 Other specified cardiac arrhythmias: Secondary | ICD-10-CM | POA: Diagnosis not present

## 2019-09-25 DIAGNOSIS — N179 Acute kidney failure, unspecified: Secondary | ICD-10-CM | POA: Diagnosis present

## 2019-09-25 DIAGNOSIS — A419 Sepsis, unspecified organism: Secondary | ICD-10-CM | POA: Diagnosis not present

## 2019-09-25 DIAGNOSIS — D61810 Antineoplastic chemotherapy induced pancytopenia: Secondary | ICD-10-CM | POA: Diagnosis present

## 2019-09-25 DIAGNOSIS — I083 Combined rheumatic disorders of mitral, aortic and tricuspid valves: Secondary | ICD-10-CM | POA: Diagnosis not present

## 2019-09-25 DIAGNOSIS — E860 Dehydration: Secondary | ICD-10-CM | POA: Diagnosis present

## 2019-09-25 DIAGNOSIS — I4821 Permanent atrial fibrillation: Secondary | ICD-10-CM | POA: Diagnosis present

## 2019-09-25 DIAGNOSIS — J189 Pneumonia, unspecified organism: Secondary | ICD-10-CM | POA: Diagnosis not present

## 2019-09-25 DIAGNOSIS — J440 Chronic obstructive pulmonary disease with acute lower respiratory infection: Secondary | ICD-10-CM | POA: Diagnosis present

## 2019-09-25 DIAGNOSIS — D469 Myelodysplastic syndrome, unspecified: Secondary | ICD-10-CM | POA: Diagnosis not present

## 2019-09-25 DIAGNOSIS — C3412 Malignant neoplasm of upper lobe, left bronchus or lung: Secondary | ICD-10-CM | POA: Diagnosis present

## 2019-09-25 DIAGNOSIS — I513 Intracardiac thrombosis, not elsewhere classified: Secondary | ICD-10-CM | POA: Diagnosis not present

## 2019-09-25 DIAGNOSIS — D638 Anemia in other chronic diseases classified elsewhere: Secondary | ICD-10-CM | POA: Diagnosis not present

## 2019-09-25 DIAGNOSIS — Z9221 Personal history of antineoplastic chemotherapy: Secondary | ICD-10-CM | POA: Diagnosis not present

## 2019-09-25 DIAGNOSIS — J329 Chronic sinusitis, unspecified: Secondary | ICD-10-CM | POA: Diagnosis not present

## 2019-09-25 DIAGNOSIS — I330 Acute and subacute infective endocarditis: Secondary | ICD-10-CM | POA: Diagnosis present

## 2019-09-25 DIAGNOSIS — I248 Other forms of acute ischemic heart disease: Secondary | ICD-10-CM | POA: Diagnosis present

## 2019-09-25 DIAGNOSIS — R4182 Altered mental status, unspecified: Secondary | ICD-10-CM | POA: Diagnosis not present

## 2019-09-25 DIAGNOSIS — R531 Weakness: Secondary | ICD-10-CM | POA: Diagnosis not present

## 2019-09-25 DIAGNOSIS — Z20822 Contact with and (suspected) exposure to covid-19: Secondary | ICD-10-CM | POA: Diagnosis present

## 2019-10-01 MED ORDER — SODIUM CHLORIDE FLUSH 0.9 % IV SOLN
20.00 | INTRAVENOUS | Status: DC
Start: ? — End: 2019-10-01

## 2019-10-01 MED ORDER — LORAZEPAM 0.5 MG PO TABS
0.50 | ORAL_TABLET | ORAL | Status: DC
Start: ? — End: 2019-10-01

## 2019-10-01 MED ORDER — IPRATROPIUM BROMIDE 0.02 % IN SOLN
0.50 | RESPIRATORY_TRACT | Status: DC
Start: 2019-10-07 — End: 2019-10-01

## 2019-10-01 MED ORDER — ALUM & MAG HYDROXIDE-SIMETH 200-200-20 MG/5ML PO SUSP
30.00 | ORAL | Status: DC
Start: ? — End: 2019-10-01

## 2019-10-01 MED ORDER — SODIUM CHLORIDE FLUSH 0.9 % IV SOLN
20.00 | INTRAVENOUS | Status: DC
Start: 2019-10-07 — End: 2019-10-01

## 2019-10-01 MED ORDER — BUDESONIDE 0.5 MG/2ML IN SUSP
0.50 | RESPIRATORY_TRACT | Status: DC
Start: 2019-10-07 — End: 2019-10-01

## 2019-10-01 MED ORDER — ACETAMINOPHEN 325 MG PO TABS
650.00 | ORAL_TABLET | ORAL | Status: DC
Start: ? — End: 2019-10-01

## 2019-10-01 MED ORDER — IPRATROPIUM BROMIDE 0.02 % IN SOLN
0.50 | RESPIRATORY_TRACT | Status: DC
Start: ? — End: 2019-10-01

## 2019-10-01 MED ORDER — DOXYCYCLINE HYCLATE 100 MG PO TABS
100.00 | ORAL_TABLET | ORAL | Status: DC
Start: 2019-10-01 — End: 2019-10-01

## 2019-10-01 MED ORDER — GABAPENTIN 300 MG PO CAPS
300.00 | ORAL_CAPSULE | ORAL | Status: DC
Start: 2019-10-07 — End: 2019-10-01

## 2019-10-08 MED ORDER — FUROSEMIDE 20 MG PO TABS
40.00 | ORAL_TABLET | ORAL | Status: DC
Start: 2019-10-08 — End: 2019-10-08

## 2019-10-08 MED ORDER — FUROSEMIDE 20 MG PO TABS
20.00 | ORAL_TABLET | ORAL | Status: DC
Start: 2019-10-07 — End: 2019-10-08

## 2019-10-08 MED ORDER — METOLAZONE 2.5 MG PO TABS
2.50 | ORAL_TABLET | ORAL | Status: DC
Start: 2019-10-10 — End: 2019-10-08

## 2019-10-09 DIAGNOSIS — C3492 Malignant neoplasm of unspecified part of left bronchus or lung: Secondary | ICD-10-CM | POA: Diagnosis not present

## 2019-10-10 DIAGNOSIS — Z950 Presence of cardiac pacemaker: Secondary | ICD-10-CM | POA: Diagnosis not present

## 2019-10-10 DIAGNOSIS — I4891 Unspecified atrial fibrillation: Secondary | ICD-10-CM | POA: Diagnosis not present

## 2019-10-10 DIAGNOSIS — Z45018 Encounter for adjustment and management of other part of cardiac pacemaker: Secondary | ICD-10-CM | POA: Diagnosis not present

## 2019-10-13 ENCOUNTER — Inpatient Hospital Stay: Payer: Medicare Other | Admitting: Family Medicine

## 2019-10-13 DIAGNOSIS — Z9484 Stem cells transplant status: Secondary | ICD-10-CM | POA: Diagnosis not present

## 2019-10-13 DIAGNOSIS — C9200 Acute myeloblastic leukemia, not having achieved remission: Secondary | ICD-10-CM | POA: Diagnosis not present

## 2019-10-13 DIAGNOSIS — I509 Heart failure, unspecified: Secondary | ICD-10-CM | POA: Diagnosis not present

## 2019-10-13 DIAGNOSIS — G4733 Obstructive sleep apnea (adult) (pediatric): Secondary | ICD-10-CM | POA: Diagnosis not present

## 2019-10-13 DIAGNOSIS — C9201 Acute myeloblastic leukemia, in remission: Secondary | ICD-10-CM | POA: Diagnosis not present

## 2019-10-13 DIAGNOSIS — R0602 Shortness of breath: Secondary | ICD-10-CM | POA: Diagnosis not present

## 2019-10-13 DIAGNOSIS — T8189XA Other complications of procedures, not elsewhere classified, initial encounter: Secondary | ICD-10-CM | POA: Diagnosis not present

## 2019-10-13 DIAGNOSIS — C3492 Malignant neoplasm of unspecified part of left bronchus or lung: Secondary | ICD-10-CM | POA: Diagnosis not present

## 2019-10-13 DIAGNOSIS — Z806 Family history of leukemia: Secondary | ICD-10-CM | POA: Diagnosis not present

## 2019-10-13 DIAGNOSIS — J479 Bronchiectasis, uncomplicated: Secondary | ICD-10-CM | POA: Diagnosis not present

## 2019-10-13 DIAGNOSIS — D469 Myelodysplastic syndrome, unspecified: Secondary | ICD-10-CM | POA: Diagnosis not present

## 2019-10-13 DIAGNOSIS — Z9481 Bone marrow transplant status: Secondary | ICD-10-CM | POA: Diagnosis not present

## 2019-10-13 DIAGNOSIS — I4821 Permanent atrial fibrillation: Secondary | ICD-10-CM | POA: Diagnosis not present

## 2019-10-13 DIAGNOSIS — I5023 Acute on chronic systolic (congestive) heart failure: Secondary | ICD-10-CM | POA: Diagnosis not present

## 2019-10-21 DIAGNOSIS — I5023 Acute on chronic systolic (congestive) heart failure: Secondary | ICD-10-CM | POA: Diagnosis not present

## 2019-10-21 DIAGNOSIS — I351 Nonrheumatic aortic (valve) insufficiency: Secondary | ICD-10-CM | POA: Diagnosis not present

## 2019-10-21 DIAGNOSIS — D696 Thrombocytopenia, unspecified: Secondary | ICD-10-CM | POA: Diagnosis not present

## 2019-10-21 DIAGNOSIS — Z9481 Bone marrow transplant status: Secondary | ICD-10-CM | POA: Diagnosis not present

## 2019-10-21 DIAGNOSIS — C9200 Acute myeloblastic leukemia, not having achieved remission: Secondary | ICD-10-CM | POA: Diagnosis not present

## 2019-10-21 DIAGNOSIS — R944 Abnormal results of kidney function studies: Secondary | ICD-10-CM | POA: Diagnosis not present

## 2019-10-21 DIAGNOSIS — C9202 Acute myeloblastic leukemia, in relapse: Secondary | ICD-10-CM | POA: Diagnosis not present

## 2019-10-21 DIAGNOSIS — D469 Myelodysplastic syndrome, unspecified: Secondary | ICD-10-CM | POA: Diagnosis not present

## 2019-10-27 DIAGNOSIS — J90 Pleural effusion, not elsewhere classified: Secondary | ICD-10-CM | POA: Diagnosis not present

## 2019-10-27 DIAGNOSIS — I4891 Unspecified atrial fibrillation: Secondary | ICD-10-CM | POA: Diagnosis not present

## 2019-10-27 DIAGNOSIS — Z9481 Bone marrow transplant status: Secondary | ICD-10-CM | POA: Diagnosis not present

## 2019-10-27 DIAGNOSIS — C9200 Acute myeloblastic leukemia, not having achieved remission: Secondary | ICD-10-CM | POA: Diagnosis not present

## 2019-10-27 DIAGNOSIS — C3492 Malignant neoplasm of unspecified part of left bronchus or lung: Secondary | ICD-10-CM | POA: Diagnosis not present

## 2019-10-27 DIAGNOSIS — I5023 Acute on chronic systolic (congestive) heart failure: Secondary | ICD-10-CM | POA: Diagnosis not present

## 2019-10-27 DIAGNOSIS — J479 Bronchiectasis, uncomplicated: Secondary | ICD-10-CM | POA: Diagnosis not present

## 2019-10-27 DIAGNOSIS — I959 Hypotension, unspecified: Secondary | ICD-10-CM | POA: Diagnosis not present

## 2019-10-27 DIAGNOSIS — G4733 Obstructive sleep apnea (adult) (pediatric): Secondary | ICD-10-CM | POA: Diagnosis not present

## 2019-10-27 DIAGNOSIS — D469 Myelodysplastic syndrome, unspecified: Secondary | ICD-10-CM | POA: Diagnosis not present

## 2019-10-27 DIAGNOSIS — T8189XA Other complications of procedures, not elsewhere classified, initial encounter: Secondary | ICD-10-CM | POA: Diagnosis not present

## 2019-10-27 DIAGNOSIS — Z950 Presence of cardiac pacemaker: Secondary | ICD-10-CM | POA: Diagnosis not present

## 2019-10-27 DIAGNOSIS — Z806 Family history of leukemia: Secondary | ICD-10-CM | POA: Diagnosis not present

## 2019-10-27 DIAGNOSIS — C9201 Acute myeloblastic leukemia, in remission: Secondary | ICD-10-CM | POA: Diagnosis not present

## 2019-10-27 DIAGNOSIS — Z9484 Stem cells transplant status: Secondary | ICD-10-CM | POA: Diagnosis not present

## 2019-10-30 ENCOUNTER — Ambulatory Visit: Payer: Medicare Other

## 2019-10-30 ENCOUNTER — Ambulatory Visit: Payer: Medicare Other | Attending: Internal Medicine

## 2019-10-30 DIAGNOSIS — I351 Nonrheumatic aortic (valve) insufficiency: Secondary | ICD-10-CM | POA: Diagnosis not present

## 2019-10-30 DIAGNOSIS — Z23 Encounter for immunization: Secondary | ICD-10-CM

## 2019-10-30 DIAGNOSIS — Z01818 Encounter for other preprocedural examination: Secondary | ICD-10-CM | POA: Diagnosis not present

## 2019-10-30 NOTE — Progress Notes (Addendum)
   Covid-19 Vaccination Clinic  Name:  Patrick Durham    MRN: [id number] DOB: 08/26/1950  10/30/2019  [name] was observed post Covid-19 immunization for 30 minutes without incident. He was provided with Vaccine Information Sheet and instruction to access the V-Safe system.   [name] was instructed to call 911 with any severe reactions post vaccine: Marland Kitchen Difficulty breathing  . Swelling of face and throat  . A fast heartbeat  . A bad rash all over body  . Dizziness and weakness   Immunizations Administered    Name Date Dose VIS Date Route   Pfizer COVID-19 Vaccine 10/30/2019  9:10 AM 0.3 mL 07/25/2019 Intramuscular   Manufacturer: [place]   Lot: [id number]   [place]: [id number]

## 2019-11-03 ENCOUNTER — Ambulatory Visit: Payer: Medicare Other

## 2019-11-03 DIAGNOSIS — C9201 Acute myeloblastic leukemia, in remission: Secondary | ICD-10-CM | POA: Diagnosis not present

## 2019-11-03 DIAGNOSIS — Z9481 Bone marrow transplant status: Secondary | ICD-10-CM | POA: Diagnosis not present

## 2019-11-04 DIAGNOSIS — C3492 Malignant neoplasm of unspecified part of left bronchus or lung: Secondary | ICD-10-CM | POA: Diagnosis not present

## 2019-11-04 DIAGNOSIS — C9201 Acute myeloblastic leukemia, in remission: Secondary | ICD-10-CM | POA: Diagnosis not present

## 2019-11-04 DIAGNOSIS — Z9481 Bone marrow transplant status: Secondary | ICD-10-CM | POA: Diagnosis not present

## 2019-11-04 DIAGNOSIS — I5023 Acute on chronic systolic (congestive) heart failure: Secondary | ICD-10-CM | POA: Diagnosis not present

## 2019-11-04 DIAGNOSIS — D469 Myelodysplastic syndrome, unspecified: Secondary | ICD-10-CM | POA: Diagnosis not present

## 2019-11-05 DIAGNOSIS — D469 Myelodysplastic syndrome, unspecified: Secondary | ICD-10-CM | POA: Diagnosis not present

## 2019-11-05 DIAGNOSIS — Z9481 Bone marrow transplant status: Secondary | ICD-10-CM | POA: Diagnosis not present

## 2019-11-10 DIAGNOSIS — I351 Nonrheumatic aortic (valve) insufficiency: Secondary | ICD-10-CM | POA: Diagnosis not present

## 2019-11-10 DIAGNOSIS — C9200 Acute myeloblastic leukemia, not having achieved remission: Secondary | ICD-10-CM | POA: Diagnosis not present

## 2019-11-10 DIAGNOSIS — I2510 Atherosclerotic heart disease of native coronary artery without angina pectoris: Secondary | ICD-10-CM | POA: Diagnosis not present

## 2019-11-10 DIAGNOSIS — Z9484 Stem cells transplant status: Secondary | ICD-10-CM | POA: Diagnosis not present

## 2019-11-10 DIAGNOSIS — D61818 Other pancytopenia: Secondary | ICD-10-CM | POA: Diagnosis not present

## 2019-11-11 DIAGNOSIS — D61818 Other pancytopenia: Secondary | ICD-10-CM | POA: Diagnosis present

## 2019-11-11 DIAGNOSIS — Z886 Allergy status to analgesic agent status: Secondary | ICD-10-CM | POA: Diagnosis not present

## 2019-11-11 DIAGNOSIS — I4821 Permanent atrial fibrillation: Secondary | ICD-10-CM | POA: Diagnosis present

## 2019-11-11 DIAGNOSIS — Z9481 Bone marrow transplant status: Secondary | ICD-10-CM | POA: Diagnosis not present

## 2019-11-11 DIAGNOSIS — G4733 Obstructive sleep apnea (adult) (pediatric): Secondary | ICD-10-CM | POA: Diagnosis present

## 2019-11-11 DIAGNOSIS — Z792 Long term (current) use of antibiotics: Secondary | ICD-10-CM | POA: Diagnosis not present

## 2019-11-11 DIAGNOSIS — Z885 Allergy status to narcotic agent status: Secondary | ICD-10-CM | POA: Diagnosis not present

## 2019-11-11 DIAGNOSIS — Z79899 Other long term (current) drug therapy: Secondary | ICD-10-CM | POA: Diagnosis not present

## 2019-11-11 DIAGNOSIS — C9201 Acute myeloblastic leukemia, in remission: Secondary | ICD-10-CM | POA: Diagnosis present

## 2019-11-11 DIAGNOSIS — I2510 Atherosclerotic heart disease of native coronary artery without angina pectoris: Secondary | ICD-10-CM | POA: Diagnosis present

## 2019-11-11 DIAGNOSIS — J449 Chronic obstructive pulmonary disease, unspecified: Secondary | ICD-10-CM | POA: Diagnosis present

## 2019-11-11 DIAGNOSIS — Z9484 Stem cells transplant status: Secondary | ICD-10-CM | POA: Diagnosis not present

## 2019-11-11 DIAGNOSIS — C9200 Acute myeloblastic leukemia, not having achieved remission: Secondary | ICD-10-CM | POA: Diagnosis not present

## 2019-11-11 DIAGNOSIS — E1165 Type 2 diabetes mellitus with hyperglycemia: Secondary | ICD-10-CM | POA: Diagnosis present

## 2019-11-11 DIAGNOSIS — Z950 Presence of cardiac pacemaker: Secondary | ICD-10-CM | POA: Diagnosis not present

## 2019-11-11 DIAGNOSIS — Z8619 Personal history of other infectious and parasitic diseases: Secondary | ICD-10-CM | POA: Diagnosis not present

## 2019-11-11 DIAGNOSIS — Z87891 Personal history of nicotine dependence: Secondary | ICD-10-CM | POA: Diagnosis not present

## 2019-11-11 DIAGNOSIS — I5022 Chronic systolic (congestive) heart failure: Secondary | ICD-10-CM | POA: Diagnosis present

## 2019-11-11 DIAGNOSIS — Z7951 Long term (current) use of inhaled steroids: Secondary | ICD-10-CM | POA: Diagnosis not present

## 2019-11-11 DIAGNOSIS — Z20822 Contact with and (suspected) exposure to covid-19: Secondary | ICD-10-CM | POA: Diagnosis present

## 2019-11-11 DIAGNOSIS — Z888 Allergy status to other drugs, medicaments and biological substances status: Secondary | ICD-10-CM | POA: Diagnosis not present

## 2019-11-11 DIAGNOSIS — Z006 Encounter for examination for normal comparison and control in clinical research program: Secondary | ICD-10-CM | POA: Diagnosis not present

## 2019-11-11 DIAGNOSIS — I358 Other nonrheumatic aortic valve disorders: Secondary | ICD-10-CM | POA: Diagnosis not present

## 2019-11-11 DIAGNOSIS — I082 Rheumatic disorders of both aortic and tricuspid valves: Secondary | ICD-10-CM | POA: Diagnosis present

## 2019-11-11 DIAGNOSIS — Z952 Presence of prosthetic heart valve: Secondary | ICD-10-CM | POA: Diagnosis not present

## 2019-11-11 DIAGNOSIS — I351 Nonrheumatic aortic (valve) insufficiency: Secondary | ICD-10-CM | POA: Diagnosis not present

## 2019-11-11 DIAGNOSIS — Z8249 Family history of ischemic heart disease and other diseases of the circulatory system: Secondary | ICD-10-CM | POA: Diagnosis not present

## 2019-11-11 DIAGNOSIS — I442 Atrioventricular block, complete: Secondary | ICD-10-CM | POA: Diagnosis present

## 2019-11-11 DIAGNOSIS — I352 Nonrheumatic aortic (valve) stenosis with insufficiency: Secondary | ICD-10-CM | POA: Diagnosis not present

## 2019-11-11 DIAGNOSIS — Z881 Allergy status to other antibiotic agents status: Secondary | ICD-10-CM | POA: Diagnosis not present

## 2019-11-14 DIAGNOSIS — Z952 Presence of prosthetic heart valve: Secondary | ICD-10-CM | POA: Insufficient documentation

## 2019-11-16 DIAGNOSIS — D469 Myelodysplastic syndrome, unspecified: Secondary | ICD-10-CM | POA: Diagnosis not present

## 2019-11-16 DIAGNOSIS — Z950 Presence of cardiac pacemaker: Secondary | ICD-10-CM | POA: Diagnosis not present

## 2019-11-16 DIAGNOSIS — I351 Nonrheumatic aortic (valve) insufficiency: Secondary | ICD-10-CM | POA: Diagnosis not present

## 2019-11-16 DIAGNOSIS — Z952 Presence of prosthetic heart valve: Secondary | ICD-10-CM | POA: Diagnosis not present

## 2019-11-17 ENCOUNTER — Ambulatory Visit: Payer: Medicare Other

## 2019-11-17 DIAGNOSIS — J440 Chronic obstructive pulmonary disease with acute lower respiratory infection: Secondary | ICD-10-CM | POA: Diagnosis not present

## 2019-11-17 DIAGNOSIS — D469 Myelodysplastic syndrome, unspecified: Secondary | ICD-10-CM | POA: Diagnosis not present

## 2019-11-17 DIAGNOSIS — M549 Dorsalgia, unspecified: Secondary | ICD-10-CM | POA: Diagnosis not present

## 2019-11-17 DIAGNOSIS — D61818 Other pancytopenia: Secondary | ICD-10-CM | POA: Diagnosis not present

## 2019-11-17 DIAGNOSIS — I5023 Acute on chronic systolic (congestive) heart failure: Secondary | ICD-10-CM | POA: Diagnosis not present

## 2019-11-17 DIAGNOSIS — Z87891 Personal history of nicotine dependence: Secondary | ICD-10-CM | POA: Diagnosis not present

## 2019-11-17 DIAGNOSIS — G4733 Obstructive sleep apnea (adult) (pediatric): Secondary | ICD-10-CM | POA: Diagnosis not present

## 2019-11-17 DIAGNOSIS — Z9889 Other specified postprocedural states: Secondary | ICD-10-CM | POA: Diagnosis not present

## 2019-11-17 DIAGNOSIS — R079 Chest pain, unspecified: Secondary | ICD-10-CM | POA: Diagnosis not present

## 2019-11-17 DIAGNOSIS — J189 Pneumonia, unspecified organism: Secondary | ICD-10-CM | POA: Diagnosis not present

## 2019-11-17 DIAGNOSIS — R05 Cough: Secondary | ICD-10-CM | POA: Diagnosis not present

## 2019-11-17 DIAGNOSIS — C9200 Acute myeloblastic leukemia, not having achieved remission: Secondary | ICD-10-CM | POA: Diagnosis not present

## 2019-11-19 DIAGNOSIS — Z9484 Stem cells transplant status: Secondary | ICD-10-CM | POA: Diagnosis not present

## 2019-11-19 DIAGNOSIS — D469 Myelodysplastic syndrome, unspecified: Secondary | ICD-10-CM | POA: Diagnosis not present

## 2019-11-19 DIAGNOSIS — I5020 Unspecified systolic (congestive) heart failure: Secondary | ICD-10-CM | POA: Diagnosis not present

## 2019-11-19 DIAGNOSIS — I5023 Acute on chronic systolic (congestive) heart failure: Secondary | ICD-10-CM | POA: Diagnosis not present

## 2019-11-19 DIAGNOSIS — I482 Chronic atrial fibrillation, unspecified: Secondary | ICD-10-CM | POA: Diagnosis not present

## 2019-11-19 DIAGNOSIS — R918 Other nonspecific abnormal finding of lung field: Secondary | ICD-10-CM | POA: Diagnosis not present

## 2019-11-19 DIAGNOSIS — Z9889 Other specified postprocedural states: Secondary | ICD-10-CM | POA: Diagnosis not present

## 2019-11-19 DIAGNOSIS — M549 Dorsalgia, unspecified: Secondary | ICD-10-CM | POA: Diagnosis not present

## 2019-11-19 DIAGNOSIS — J440 Chronic obstructive pulmonary disease with acute lower respiratory infection: Secondary | ICD-10-CM | POA: Diagnosis present

## 2019-11-19 DIAGNOSIS — J90 Pleural effusion, not elsewhere classified: Secondary | ICD-10-CM | POA: Diagnosis not present

## 2019-11-19 DIAGNOSIS — J189 Pneumonia, unspecified organism: Secondary | ICD-10-CM | POA: Diagnosis not present

## 2019-11-19 DIAGNOSIS — J918 Pleural effusion in other conditions classified elsewhere: Secondary | ICD-10-CM | POA: Diagnosis not present

## 2019-11-19 DIAGNOSIS — R9389 Abnormal findings on diagnostic imaging of other specified body structures: Secondary | ICD-10-CM | POA: Diagnosis not present

## 2019-11-19 DIAGNOSIS — Z923 Personal history of irradiation: Secondary | ICD-10-CM | POA: Diagnosis not present

## 2019-11-19 DIAGNOSIS — G4733 Obstructive sleep apnea (adult) (pediatric): Secondary | ICD-10-CM | POA: Diagnosis present

## 2019-11-19 DIAGNOSIS — Z950 Presence of cardiac pacemaker: Secondary | ICD-10-CM | POA: Diagnosis not present

## 2019-11-19 DIAGNOSIS — J181 Lobar pneumonia, unspecified organism: Secondary | ICD-10-CM | POA: Diagnosis not present

## 2019-11-19 DIAGNOSIS — Z952 Presence of prosthetic heart valve: Secondary | ICD-10-CM | POA: Diagnosis not present

## 2019-11-19 DIAGNOSIS — Z8679 Personal history of other diseases of the circulatory system: Secondary | ICD-10-CM | POA: Diagnosis not present

## 2019-11-19 DIAGNOSIS — I4891 Unspecified atrial fibrillation: Secondary | ICD-10-CM | POA: Diagnosis not present

## 2019-11-19 DIAGNOSIS — Z85118 Personal history of other malignant neoplasm of bronchus and lung: Secondary | ICD-10-CM | POA: Diagnosis not present

## 2019-11-19 DIAGNOSIS — R05 Cough: Secondary | ICD-10-CM | POA: Diagnosis not present

## 2019-11-19 DIAGNOSIS — C9200 Acute myeloblastic leukemia, not having achieved remission: Secondary | ICD-10-CM | POA: Diagnosis present

## 2019-11-19 DIAGNOSIS — R079 Chest pain, unspecified: Secondary | ICD-10-CM | POA: Diagnosis not present

## 2019-11-19 DIAGNOSIS — D61818 Other pancytopenia: Secondary | ICD-10-CM | POA: Diagnosis not present

## 2019-11-19 DIAGNOSIS — Z87891 Personal history of nicotine dependence: Secondary | ICD-10-CM | POA: Diagnosis not present

## 2019-11-19 DIAGNOSIS — Z955 Presence of coronary angioplasty implant and graft: Secondary | ICD-10-CM | POA: Diagnosis not present

## 2019-11-24 DIAGNOSIS — D469 Myelodysplastic syndrome, unspecified: Secondary | ICD-10-CM | POA: Diagnosis not present

## 2019-11-24 DIAGNOSIS — Z9481 Bone marrow transplant status: Secondary | ICD-10-CM | POA: Diagnosis not present

## 2019-11-24 DIAGNOSIS — C3492 Malignant neoplasm of unspecified part of left bronchus or lung: Secondary | ICD-10-CM | POA: Diagnosis not present

## 2019-11-24 DIAGNOSIS — I5023 Acute on chronic systolic (congestive) heart failure: Secondary | ICD-10-CM | POA: Diagnosis not present

## 2019-11-24 DIAGNOSIS — C9201 Acute myeloblastic leukemia, in remission: Secondary | ICD-10-CM | POA: Diagnosis not present

## 2019-11-24 DIAGNOSIS — D649 Anemia, unspecified: Secondary | ICD-10-CM | POA: Diagnosis not present

## 2019-11-26 ENCOUNTER — Ambulatory Visit: Payer: Medicare Other

## 2019-11-26 DIAGNOSIS — C9201 Acute myeloblastic leukemia, in remission: Secondary | ICD-10-CM | POA: Diagnosis not present

## 2019-11-26 DIAGNOSIS — D469 Myelodysplastic syndrome, unspecified: Secondary | ICD-10-CM | POA: Diagnosis not present

## 2019-11-26 DIAGNOSIS — C3492 Malignant neoplasm of unspecified part of left bronchus or lung: Secondary | ICD-10-CM | POA: Diagnosis not present

## 2019-11-26 DIAGNOSIS — Z9484 Stem cells transplant status: Secondary | ICD-10-CM | POA: Diagnosis not present

## 2019-11-28 DIAGNOSIS — Z9484 Stem cells transplant status: Secondary | ICD-10-CM | POA: Diagnosis not present

## 2019-11-28 DIAGNOSIS — C9201 Acute myeloblastic leukemia, in remission: Secondary | ICD-10-CM | POA: Diagnosis not present

## 2019-11-28 DIAGNOSIS — C3492 Malignant neoplasm of unspecified part of left bronchus or lung: Secondary | ICD-10-CM | POA: Diagnosis not present

## 2019-11-28 DIAGNOSIS — D469 Myelodysplastic syndrome, unspecified: Secondary | ICD-10-CM | POA: Diagnosis not present

## 2019-12-01 DIAGNOSIS — Z950 Presence of cardiac pacemaker: Secondary | ICD-10-CM | POA: Diagnosis not present

## 2019-12-01 DIAGNOSIS — I5023 Acute on chronic systolic (congestive) heart failure: Secondary | ICD-10-CM | POA: Diagnosis not present

## 2019-12-01 DIAGNOSIS — Z79899 Other long term (current) drug therapy: Secondary | ICD-10-CM | POA: Diagnosis not present

## 2019-12-01 DIAGNOSIS — Z5111 Encounter for antineoplastic chemotherapy: Secondary | ICD-10-CM | POA: Diagnosis not present

## 2019-12-01 DIAGNOSIS — D61818 Other pancytopenia: Secondary | ICD-10-CM | POA: Diagnosis not present

## 2019-12-01 DIAGNOSIS — I4891 Unspecified atrial fibrillation: Secondary | ICD-10-CM | POA: Diagnosis not present

## 2019-12-01 DIAGNOSIS — C3492 Malignant neoplasm of unspecified part of left bronchus or lung: Secondary | ICD-10-CM | POA: Diagnosis not present

## 2019-12-01 DIAGNOSIS — Z807 Family history of other malignant neoplasms of lymphoid, hematopoietic and related tissues: Secondary | ICD-10-CM | POA: Diagnosis not present

## 2019-12-01 DIAGNOSIS — D126 Benign neoplasm of colon, unspecified: Secondary | ICD-10-CM | POA: Diagnosis not present

## 2019-12-01 DIAGNOSIS — Z9484 Stem cells transplant status: Secondary | ICD-10-CM | POA: Diagnosis not present

## 2019-12-01 DIAGNOSIS — D469 Myelodysplastic syndrome, unspecified: Secondary | ICD-10-CM | POA: Diagnosis not present

## 2019-12-01 DIAGNOSIS — C9200 Acute myeloblastic leukemia, not having achieved remission: Secondary | ICD-10-CM | POA: Diagnosis not present

## 2019-12-02 ENCOUNTER — Ambulatory Visit: Payer: Medicare Other

## 2019-12-02 DIAGNOSIS — C9200 Acute myeloblastic leukemia, not having achieved remission: Secondary | ICD-10-CM | POA: Diagnosis not present

## 2019-12-02 DIAGNOSIS — Z5111 Encounter for antineoplastic chemotherapy: Secondary | ICD-10-CM | POA: Diagnosis not present

## 2019-12-03 DIAGNOSIS — Z5111 Encounter for antineoplastic chemotherapy: Secondary | ICD-10-CM | POA: Diagnosis not present

## 2019-12-03 DIAGNOSIS — D469 Myelodysplastic syndrome, unspecified: Secondary | ICD-10-CM | POA: Diagnosis not present

## 2019-12-04 DIAGNOSIS — Z5111 Encounter for antineoplastic chemotherapy: Secondary | ICD-10-CM | POA: Diagnosis not present

## 2019-12-04 DIAGNOSIS — C9200 Acute myeloblastic leukemia, not having achieved remission: Secondary | ICD-10-CM | POA: Diagnosis not present

## 2019-12-05 DIAGNOSIS — Z5111 Encounter for antineoplastic chemotherapy: Secondary | ICD-10-CM | POA: Diagnosis not present

## 2019-12-05 DIAGNOSIS — Z9481 Bone marrow transplant status: Secondary | ICD-10-CM | POA: Diagnosis not present

## 2019-12-05 DIAGNOSIS — D469 Myelodysplastic syndrome, unspecified: Secondary | ICD-10-CM | POA: Diagnosis not present

## 2019-12-05 DIAGNOSIS — C9201 Acute myeloblastic leukemia, in remission: Secondary | ICD-10-CM | POA: Diagnosis not present

## 2019-12-08 DIAGNOSIS — I5023 Acute on chronic systolic (congestive) heart failure: Secondary | ICD-10-CM | POA: Diagnosis not present

## 2019-12-08 DIAGNOSIS — C9201 Acute myeloblastic leukemia, in remission: Secondary | ICD-10-CM | POA: Diagnosis not present

## 2019-12-08 DIAGNOSIS — Z87891 Personal history of nicotine dependence: Secondary | ICD-10-CM | POA: Diagnosis not present

## 2019-12-08 DIAGNOSIS — C3492 Malignant neoplasm of unspecified part of left bronchus or lung: Secondary | ICD-10-CM | POA: Diagnosis not present

## 2019-12-08 DIAGNOSIS — Z9481 Bone marrow transplant status: Secondary | ICD-10-CM | POA: Diagnosis not present

## 2019-12-08 DIAGNOSIS — I97190 Other postprocedural cardiac functional disturbances following cardiac surgery: Secondary | ICD-10-CM | POA: Diagnosis not present

## 2019-12-08 DIAGNOSIS — J90 Pleural effusion, not elsewhere classified: Secondary | ICD-10-CM | POA: Diagnosis not present

## 2019-12-08 DIAGNOSIS — I442 Atrioventricular block, complete: Secondary | ICD-10-CM | POA: Diagnosis not present

## 2019-12-08 DIAGNOSIS — D469 Myelodysplastic syndrome, unspecified: Secondary | ICD-10-CM | POA: Diagnosis not present

## 2019-12-08 DIAGNOSIS — Z952 Presence of prosthetic heart valve: Secondary | ICD-10-CM | POA: Diagnosis not present

## 2019-12-10 DIAGNOSIS — Z9481 Bone marrow transplant status: Secondary | ICD-10-CM | POA: Diagnosis not present

## 2019-12-10 DIAGNOSIS — D469 Myelodysplastic syndrome, unspecified: Secondary | ICD-10-CM | POA: Diagnosis not present

## 2019-12-10 DIAGNOSIS — Z952 Presence of prosthetic heart valve: Secondary | ICD-10-CM | POA: Diagnosis not present

## 2019-12-10 DIAGNOSIS — Z87891 Personal history of nicotine dependence: Secondary | ICD-10-CM | POA: Diagnosis not present

## 2019-12-10 DIAGNOSIS — C9201 Acute myeloblastic leukemia, in remission: Secondary | ICD-10-CM | POA: Diagnosis not present

## 2019-12-10 DIAGNOSIS — J90 Pleural effusion, not elsewhere classified: Secondary | ICD-10-CM | POA: Diagnosis not present

## 2019-12-10 DIAGNOSIS — I97190 Other postprocedural cardiac functional disturbances following cardiac surgery: Secondary | ICD-10-CM | POA: Diagnosis not present

## 2019-12-10 DIAGNOSIS — I442 Atrioventricular block, complete: Secondary | ICD-10-CM | POA: Diagnosis not present

## 2019-12-10 DIAGNOSIS — I5023 Acute on chronic systolic (congestive) heart failure: Secondary | ICD-10-CM | POA: Diagnosis not present

## 2019-12-12 DIAGNOSIS — C3492 Malignant neoplasm of unspecified part of left bronchus or lung: Secondary | ICD-10-CM | POA: Diagnosis not present

## 2019-12-12 DIAGNOSIS — D469 Myelodysplastic syndrome, unspecified: Secondary | ICD-10-CM | POA: Diagnosis not present

## 2019-12-12 DIAGNOSIS — Z9484 Stem cells transplant status: Secondary | ICD-10-CM | POA: Diagnosis not present

## 2019-12-12 DIAGNOSIS — C9201 Acute myeloblastic leukemia, in remission: Secondary | ICD-10-CM | POA: Diagnosis not present

## 2019-12-12 DIAGNOSIS — Z87891 Personal history of nicotine dependence: Secondary | ICD-10-CM | POA: Diagnosis not present

## 2019-12-12 DIAGNOSIS — I5023 Acute on chronic systolic (congestive) heart failure: Secondary | ICD-10-CM | POA: Diagnosis not present

## 2019-12-15 DIAGNOSIS — Z9889 Other specified postprocedural states: Secondary | ICD-10-CM | POA: Diagnosis not present

## 2019-12-15 DIAGNOSIS — Z9481 Bone marrow transplant status: Secondary | ICD-10-CM | POA: Diagnosis not present

## 2019-12-15 DIAGNOSIS — Z7982 Long term (current) use of aspirin: Secondary | ICD-10-CM | POA: Diagnosis not present

## 2019-12-15 DIAGNOSIS — Z79899 Other long term (current) drug therapy: Secondary | ICD-10-CM | POA: Diagnosis not present

## 2019-12-15 DIAGNOSIS — I482 Chronic atrial fibrillation, unspecified: Secondary | ICD-10-CM | POA: Diagnosis not present

## 2019-12-15 DIAGNOSIS — Z950 Presence of cardiac pacemaker: Secondary | ICD-10-CM | POA: Diagnosis not present

## 2019-12-15 DIAGNOSIS — Z9484 Stem cells transplant status: Secondary | ICD-10-CM | POA: Diagnosis not present

## 2019-12-15 DIAGNOSIS — D709 Neutropenia, unspecified: Secondary | ICD-10-CM | POA: Diagnosis not present

## 2019-12-15 DIAGNOSIS — Z8619 Personal history of other infectious and parasitic diseases: Secondary | ICD-10-CM | POA: Diagnosis not present

## 2019-12-15 DIAGNOSIS — C9201 Acute myeloblastic leukemia, in remission: Secondary | ICD-10-CM | POA: Diagnosis not present

## 2019-12-15 DIAGNOSIS — M869 Osteomyelitis, unspecified: Secondary | ICD-10-CM | POA: Diagnosis not present

## 2019-12-15 DIAGNOSIS — Z8719 Personal history of other diseases of the digestive system: Secondary | ICD-10-CM | POA: Diagnosis not present

## 2019-12-15 DIAGNOSIS — Z8589 Personal history of malignant neoplasm of other organs and systems: Secondary | ICD-10-CM | POA: Diagnosis not present

## 2019-12-15 DIAGNOSIS — C9202 Acute myeloblastic leukemia, in relapse: Secondary | ICD-10-CM | POA: Diagnosis not present

## 2019-12-15 DIAGNOSIS — D469 Myelodysplastic syndrome, unspecified: Secondary | ICD-10-CM | POA: Diagnosis not present

## 2019-12-16 DIAGNOSIS — Z952 Presence of prosthetic heart valve: Secondary | ICD-10-CM | POA: Diagnosis not present

## 2019-12-18 ENCOUNTER — Other Ambulatory Visit: Payer: Self-pay

## 2019-12-18 NOTE — Telephone Encounter (Signed)
Patient needs to be seen lp

## 2019-12-18 NOTE — Telephone Encounter (Signed)
He set up an appointment for 01/06/2020 at 10:15 am.

## 2019-12-19 DIAGNOSIS — Z8701 Personal history of pneumonia (recurrent): Secondary | ICD-10-CM | POA: Diagnosis not present

## 2019-12-19 DIAGNOSIS — D61810 Antineoplastic chemotherapy induced pancytopenia: Secondary | ICD-10-CM | POA: Diagnosis present

## 2019-12-19 DIAGNOSIS — I482 Chronic atrial fibrillation, unspecified: Secondary | ICD-10-CM | POA: Diagnosis present

## 2019-12-19 DIAGNOSIS — I5020 Unspecified systolic (congestive) heart failure: Secondary | ICD-10-CM | POA: Diagnosis present

## 2019-12-19 DIAGNOSIS — C9200 Acute myeloblastic leukemia, not having achieved remission: Secondary | ICD-10-CM | POA: Diagnosis not present

## 2019-12-19 DIAGNOSIS — J90 Pleural effusion, not elsewhere classified: Secondary | ICD-10-CM | POA: Diagnosis not present

## 2019-12-19 DIAGNOSIS — Z85118 Personal history of other malignant neoplasm of bronchus and lung: Secondary | ICD-10-CM | POA: Diagnosis not present

## 2019-12-19 DIAGNOSIS — Z952 Presence of prosthetic heart valve: Secondary | ICD-10-CM | POA: Diagnosis not present

## 2019-12-19 DIAGNOSIS — R5081 Fever presenting with conditions classified elsewhere: Secondary | ICD-10-CM | POA: Diagnosis present

## 2019-12-19 DIAGNOSIS — C9202 Acute myeloblastic leukemia, in relapse: Secondary | ICD-10-CM | POA: Diagnosis present

## 2019-12-19 DIAGNOSIS — D709 Neutropenia, unspecified: Secondary | ICD-10-CM | POA: Diagnosis present

## 2019-12-19 DIAGNOSIS — Z66 Do not resuscitate: Secondary | ICD-10-CM | POA: Diagnosis present

## 2019-12-19 DIAGNOSIS — Z20822 Contact with and (suspected) exposure to covid-19: Secondary | ICD-10-CM | POA: Diagnosis present

## 2019-12-19 DIAGNOSIS — Z923 Personal history of irradiation: Secondary | ICD-10-CM | POA: Diagnosis not present

## 2019-12-19 DIAGNOSIS — D61818 Other pancytopenia: Secondary | ICD-10-CM | POA: Diagnosis not present

## 2019-12-19 DIAGNOSIS — Z950 Presence of cardiac pacemaker: Secondary | ICD-10-CM | POA: Diagnosis not present

## 2019-12-19 DIAGNOSIS — Z79899 Other long term (current) drug therapy: Secondary | ICD-10-CM | POA: Diagnosis not present

## 2019-12-19 DIAGNOSIS — Z7982 Long term (current) use of aspirin: Secondary | ICD-10-CM | POA: Diagnosis not present

## 2019-12-19 DIAGNOSIS — R918 Other nonspecific abnormal finding of lung field: Secondary | ICD-10-CM | POA: Diagnosis not present

## 2019-12-19 DIAGNOSIS — T451X5A Adverse effect of antineoplastic and immunosuppressive drugs, initial encounter: Secondary | ICD-10-CM | POA: Diagnosis present

## 2019-12-19 DIAGNOSIS — Z806 Family history of leukemia: Secondary | ICD-10-CM | POA: Diagnosis not present

## 2019-12-19 DIAGNOSIS — Z87891 Personal history of nicotine dependence: Secondary | ICD-10-CM | POA: Diagnosis not present

## 2019-12-22 ENCOUNTER — Other Ambulatory Visit: Payer: Self-pay

## 2019-12-22 MED ORDER — FUROSEMIDE 20 MG PO TABS
40.00 | ORAL_TABLET | ORAL | Status: DC
Start: 2019-12-22 — End: 2019-12-22

## 2019-12-22 MED ORDER — AQUADEKS PO CHEW
1.00 | CHEWABLE_TABLET | ORAL | Status: DC
Start: 2019-12-22 — End: 2019-12-22

## 2019-12-22 MED ORDER — SALINE NASAL SPRAY 0.65 % NA SOLN
1.00 | NASAL | Status: DC
Start: ? — End: 2019-12-22

## 2019-12-22 MED ORDER — ACYCLOVIR 400 MG PO TABS
400.00 | ORAL_TABLET | ORAL | Status: DC
Start: 2019-12-22 — End: 2019-12-22

## 2019-12-22 MED ORDER — LORAZEPAM 1 MG PO TABS
1.00 | ORAL_TABLET | ORAL | Status: DC
Start: 2019-12-22 — End: 2019-12-22

## 2019-12-22 MED ORDER — TIOTROPIUM BROMIDE MONOHYDRATE 18 MCG IN CAPS
1.00 | ORAL_CAPSULE | RESPIRATORY_TRACT | Status: DC
Start: 2019-12-22 — End: 2019-12-22

## 2019-12-22 MED ORDER — PANTOPRAZOLE SODIUM 40 MG PO TBEC
40.00 | DELAYED_RELEASE_TABLET | ORAL | Status: DC
Start: ? — End: 2019-12-22

## 2019-12-22 MED ORDER — MAGNESIUM OXIDE 400 MG PO TABS
400.00 | ORAL_TABLET | ORAL | Status: DC
Start: 2019-12-22 — End: 2019-12-22

## 2019-12-22 MED ORDER — ASPIRIN 81 MG PO TBEC
81.00 | DELAYED_RELEASE_TABLET | ORAL | Status: DC
Start: 2019-12-22 — End: 2019-12-22

## 2019-12-22 MED ORDER — SODIUM CHLORIDE FLUSH 0.9 % IV SOLN
20.00 | INTRAVENOUS | Status: DC
Start: ? — End: 2019-12-22

## 2019-12-22 MED ORDER — SODIUM CHLORIDE FLUSH 0.9 % IV SOLN
20.00 | INTRAVENOUS | Status: DC
Start: 2019-12-22 — End: 2019-12-22

## 2019-12-22 MED ORDER — BUDESONIDE 0.5 MG/2ML IN SUSP
0.50 | RESPIRATORY_TRACT | Status: DC
Start: 2019-12-22 — End: 2019-12-22

## 2019-12-22 MED ORDER — FLUCONAZOLE 200 MG PO TABS
400.00 | ORAL_TABLET | ORAL | Status: DC
Start: 2019-12-22 — End: 2019-12-22

## 2019-12-22 MED ORDER — LORAZEPAM 1 MG PO TABS: 1.0000 mg | ORAL_TABLET | Freq: Two times a day (BID) | ORAL | 3 refills | Status: DC

## 2019-12-22 MED ORDER — GUAIFENESIN ER 600 MG PO TB12
1200.00 | ORAL_TABLET | ORAL | Status: DC
Start: ? — End: 2019-12-22

## 2019-12-22 MED ORDER — FLUTICASONE PROPIONATE 50 MCG/ACT NA SUSP
1.00 | NASAL | Status: DC
Start: ? — End: 2019-12-22

## 2019-12-22 MED ORDER — GENERIC EXTERNAL MEDICATION
500.00 | Status: DC
Start: 2019-12-22 — End: 2019-12-22

## 2019-12-22 MED ORDER — SODIUM CHLORIDE 0.9 % IV SOLN
INTRAVENOUS | Status: DC
Start: ? — End: 2019-12-22

## 2019-12-22 MED ORDER — ALBUTEROL SULFATE (2.5 MG/3ML) 0.083% IN NEBU
2.50 | INHALATION_SOLUTION | RESPIRATORY_TRACT | Status: DC
Start: ? — End: 2019-12-22

## 2019-12-22 MED ORDER — GENERIC EXTERNAL MEDICATION
2.00 | Status: DC
Start: 2019-12-22 — End: 2019-12-22

## 2019-12-22 MED ORDER — METOLAZONE 2.5 MG PO TABS
2.50 | ORAL_TABLET | ORAL | Status: DC
Start: 2019-12-27 — End: 2019-12-22

## 2019-12-22 MED ORDER — FUROSEMIDE 20 MG PO TABS
20.00 | ORAL_TABLET | ORAL | Status: DC
Start: 2019-12-22 — End: 2019-12-22

## 2019-12-22 MED ORDER — GABAPENTIN 300 MG PO CAPS
300.00 | ORAL_CAPSULE | ORAL | Status: DC
Start: 2019-12-22 — End: 2019-12-22

## 2019-12-23 ENCOUNTER — Ambulatory Visit: Payer: Medicare Other

## 2019-12-26 DIAGNOSIS — C9201 Acute myeloblastic leukemia, in remission: Secondary | ICD-10-CM | POA: Diagnosis not present

## 2019-12-26 DIAGNOSIS — I5023 Acute on chronic systolic (congestive) heart failure: Secondary | ICD-10-CM | POA: Diagnosis not present

## 2019-12-26 DIAGNOSIS — D469 Myelodysplastic syndrome, unspecified: Secondary | ICD-10-CM | POA: Diagnosis not present

## 2019-12-26 DIAGNOSIS — I824Z9 Acute embolism and thrombosis of unspecified deep veins of unspecified distal lower extremity: Secondary | ICD-10-CM | POA: Diagnosis not present

## 2019-12-29 DIAGNOSIS — D469 Myelodysplastic syndrome, unspecified: Secondary | ICD-10-CM | POA: Diagnosis not present

## 2019-12-29 DIAGNOSIS — Z79899 Other long term (current) drug therapy: Secondary | ICD-10-CM | POA: Diagnosis not present

## 2019-12-29 DIAGNOSIS — Z9481 Bone marrow transplant status: Secondary | ICD-10-CM | POA: Diagnosis not present

## 2019-12-29 DIAGNOSIS — C9201 Acute myeloblastic leukemia, in remission: Secondary | ICD-10-CM | POA: Diagnosis not present

## 2019-12-29 DIAGNOSIS — C9200 Acute myeloblastic leukemia, not having achieved remission: Secondary | ICD-10-CM | POA: Diagnosis not present

## 2019-12-30 ENCOUNTER — Ambulatory Visit: Payer: Medicare Other | Attending: Internal Medicine

## 2019-12-30 DIAGNOSIS — Z23 Encounter for immunization: Secondary | ICD-10-CM

## 2019-12-30 NOTE — Progress Notes (Signed)
   Covid-19 Vaccination Clinic  Name:  Praneeth Bussey    MRN: [id number] DOB: 20-Jul-1951  12/30/2019  Mr. Rinkenberger was observed post Covid-19 immunization for 15 minutes without incident. He was provided with Vaccine Information Sheet and instruction to access the V-Safe system.   Mr. Troiano was instructed to call 911 with any severe reactions post vaccine: Marland Kitchen Difficulty breathing  . Swelling of face and throat  . A fast heartbeat  . A bad rash all over body  . Dizziness and weakness   Immunizations Administered    Name Date Dose VIS Date Route   Pfizer COVID-19 Vaccine 12/30/2019 11:28 AM 0.3 mL 10/08/2018 Intramuscular   Manufacturer: [place]   Lot: [id number]   [place]: [id number]

## 2020-01-02 DIAGNOSIS — D469 Myelodysplastic syndrome, unspecified: Secondary | ICD-10-CM | POA: Diagnosis not present

## 2020-01-02 DIAGNOSIS — Z9481 Bone marrow transplant status: Secondary | ICD-10-CM | POA: Diagnosis not present

## 2020-01-02 DIAGNOSIS — C9201 Acute myeloblastic leukemia, in remission: Secondary | ICD-10-CM | POA: Diagnosis not present

## 2020-01-02 LAB — LAB REPORT - SCANNED
Alkaline Phosphatase: 133
BUN: 33
Creatinine, Ser: 1.27
D-Dimer: 6780
Fibrinogen: 530
Glucose, Bld: 157 — AB (ref 70–99)
HCT: 26 — AB (ref 29–41)
Hemoglobin: 8.9
INR: 1.1
Platelet: 100
Potassium: 4.2
Prothrombin Time: 11.4
Red Blood Cell Count: 2.82
Uric Acid: 9.3
WBC: 2.4

## 2020-01-05 ENCOUNTER — Encounter: Payer: Self-pay | Admitting: Legal Medicine

## 2020-01-05 DIAGNOSIS — Z5111 Encounter for antineoplastic chemotherapy: Secondary | ICD-10-CM | POA: Diagnosis not present

## 2020-01-05 DIAGNOSIS — D469 Myelodysplastic syndrome, unspecified: Secondary | ICD-10-CM | POA: Diagnosis not present

## 2020-01-05 DIAGNOSIS — I110 Hypertensive heart disease with heart failure: Secondary | ICD-10-CM | POA: Insufficient documentation

## 2020-01-05 DIAGNOSIS — I5022 Chronic systolic (congestive) heart failure: Secondary | ICD-10-CM | POA: Insufficient documentation

## 2020-01-05 DIAGNOSIS — R7303 Prediabetes: Secondary | ICD-10-CM | POA: Insufficient documentation

## 2020-01-05 DIAGNOSIS — Z9481 Bone marrow transplant status: Secondary | ICD-10-CM | POA: Diagnosis not present

## 2020-01-05 DIAGNOSIS — G4733 Obstructive sleep apnea (adult) (pediatric): Secondary | ICD-10-CM | POA: Insufficient documentation

## 2020-01-05 DIAGNOSIS — I4819 Other persistent atrial fibrillation: Secondary | ICD-10-CM | POA: Insufficient documentation

## 2020-01-05 DIAGNOSIS — G603 Idiopathic progressive neuropathy: Secondary | ICD-10-CM | POA: Insufficient documentation

## 2020-01-05 DIAGNOSIS — C9201 Acute myeloblastic leukemia, in remission: Secondary | ICD-10-CM | POA: Diagnosis not present

## 2020-01-05 LAB — LAB REPORT - SCANNED
Alkaline Phosphatase: 134
BUN: 23
Creatinine, Ser: 1.21
Glucose, Bld: 182 — AB (ref 70–99)
HCT: 25 — AB (ref 29–41)
Hemoglobin: 8.8
Platelet: 112
Potassium: 4.3
Red Blood Cell Count: 2.78
WBC: 3.5

## 2020-01-06 ENCOUNTER — Other Ambulatory Visit: Payer: Self-pay

## 2020-01-06 ENCOUNTER — Ambulatory Visit (INDEPENDENT_AMBULATORY_CARE_PROVIDER_SITE_OTHER): Payer: Medicare Other | Admitting: Legal Medicine

## 2020-01-06 ENCOUNTER — Encounter: Payer: Self-pay | Admitting: Legal Medicine

## 2020-01-06 VITALS — BP 118/61 | HR 86 | Temp 97.3°F | Resp 16 | Ht 70.0 in | Wt 201.6 lb

## 2020-01-06 DIAGNOSIS — I4891 Unspecified atrial fibrillation: Secondary | ICD-10-CM | POA: Diagnosis not present

## 2020-01-06 DIAGNOSIS — G603 Idiopathic progressive neuropathy: Secondary | ICD-10-CM | POA: Diagnosis not present

## 2020-01-06 DIAGNOSIS — I110 Hypertensive heart disease with heart failure: Secondary | ICD-10-CM

## 2020-01-06 DIAGNOSIS — I5022 Chronic systolic (congestive) heart failure: Secondary | ICD-10-CM

## 2020-01-06 DIAGNOSIS — R7303 Prediabetes: Secondary | ICD-10-CM

## 2020-01-06 DIAGNOSIS — Z9481 Bone marrow transplant status: Secondary | ICD-10-CM | POA: Diagnosis not present

## 2020-01-06 DIAGNOSIS — I5023 Acute on chronic systolic (congestive) heart failure: Secondary | ICD-10-CM | POA: Diagnosis not present

## 2020-01-06 DIAGNOSIS — Z85118 Personal history of other malignant neoplasm of bronchus and lung: Secondary | ICD-10-CM | POA: Diagnosis not present

## 2020-01-06 DIAGNOSIS — Z923 Personal history of irradiation: Secondary | ICD-10-CM | POA: Diagnosis not present

## 2020-01-06 DIAGNOSIS — C3492 Malignant neoplasm of unspecified part of left bronchus or lung: Secondary | ICD-10-CM | POA: Diagnosis not present

## 2020-01-06 DIAGNOSIS — Z9484 Stem cells transplant status: Secondary | ICD-10-CM | POA: Diagnosis not present

## 2020-01-06 DIAGNOSIS — Z6828 Body mass index (BMI) 28.0-28.9, adult: Secondary | ICD-10-CM | POA: Diagnosis not present

## 2020-01-06 DIAGNOSIS — G4733 Obstructive sleep apnea (adult) (pediatric): Secondary | ICD-10-CM

## 2020-01-06 DIAGNOSIS — Z952 Presence of prosthetic heart valve: Secondary | ICD-10-CM | POA: Diagnosis not present

## 2020-01-06 DIAGNOSIS — C9201 Acute myeloblastic leukemia, in remission: Secondary | ICD-10-CM | POA: Diagnosis not present

## 2020-01-06 DIAGNOSIS — Z5111 Encounter for antineoplastic chemotherapy: Secondary | ICD-10-CM | POA: Diagnosis not present

## 2020-01-06 DIAGNOSIS — J90 Pleural effusion, not elsewhere classified: Secondary | ICD-10-CM | POA: Diagnosis not present

## 2020-01-06 DIAGNOSIS — I482 Chronic atrial fibrillation, unspecified: Secondary | ICD-10-CM | POA: Diagnosis not present

## 2020-01-06 DIAGNOSIS — IMO0001 Reserved for inherently not codable concepts without codable children: Secondary | ICD-10-CM | POA: Insufficient documentation

## 2020-01-06 DIAGNOSIS — J411 Mucopurulent chronic bronchitis: Secondary | ICD-10-CM | POA: Diagnosis not present

## 2020-01-06 DIAGNOSIS — Z87891 Personal history of nicotine dependence: Secondary | ICD-10-CM | POA: Diagnosis not present

## 2020-01-06 DIAGNOSIS — Z7409 Other reduced mobility: Secondary | ICD-10-CM | POA: Insufficient documentation

## 2020-01-06 DIAGNOSIS — I4819 Other persistent atrial fibrillation: Secondary | ICD-10-CM | POA: Diagnosis not present

## 2020-01-06 MED ORDER — GABAPENTIN 300 MG PO CAPS: 300.0000 mg | ORAL_CAPSULE | Freq: Two times a day (BID) | ORAL | 3 refills | Status: DC

## 2020-01-06 NOTE — Assessment & Plan Note (Signed)
He is compliant with his CPAP

## 2020-01-06 NOTE — Assessment & Plan Note (Signed)

## 2020-01-06 NOTE — Assessment & Plan Note (Signed)
AN INDIVIDUAL CARE PLAN was established and reinforced today.  The patient's status was assessed using clinical findings on exam, labs, and other diagnostic testing. Patient's success at meeting treatment goals based on disease specific evidence-bassed guidelines and found to be in good control. RECOMMENDATIONS include maintaining present medicines and treatment. 

## 2020-01-06 NOTE — Assessment & Plan Note (Signed)
AN INDIVIDUAL CARE PLAN was established and reinforced today.  The patient's status was assessed using clinical findings on exam, labs, and other diagnostic testing. Patient's success at meeting treatment goals based on disease specific evidence-bassed guidelines and found to be in gopod control. RECOMMENDATIONS include maintaining present medicines and treatment.

## 2020-01-06 NOTE — Assessment & Plan Note (Signed)
He is stable and will get A1c next week at [facility]

## 2020-01-06 NOTE — Assessment & Plan Note (Signed)
An individualized care plan was established and reinforced.  The patient's disease status was assessed using clinical finding son exam today, labs, and/or other diagnostic testing such as x-rays, to determine the patient's success in meeting treatmentgoalsbased on disease-based guidelines and found to beimproving. But not at goal yet. Medications prescriptions no change Laboratory tests ordered to be performed today include none. RECOMMENDATIONS: given include follow up with cardiology.  Call physician is patient gains 3 lbs in one day or 5 lbs for one week.  Call for progressive PND, orthopnea or increased pedal edema.

## 2020-01-06 NOTE — Assessment & Plan Note (Signed)
He is stable with this weight and continue diet and exercise

## 2020-01-06 NOTE — Progress Notes (Signed)
Established Patient Office Visit  Subjective:  Patient ID: Patrick Durham, male    DOB: 11-May-1951  Age: 68 y.o. MRN: [id number]  CC:  Chief Complaint  Patient presents with  . Prediabetes  . Atrial Fibrillation  . Peripheral Neuropathy   Very complex patient all [facility] reviewed >67minutes. HPI Patrick Durham presents for Chronic visit.  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  He had recurrence with myelodysplasia .  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  It is in remission.  Atrial fibrillation with pacer installed, he also has aortic valve replacement.   He has severe peripheral neuropathy and on gabapentin with fair control.  Patient has prediabetes and watches diet. Past Medical History:  Diagnosis Date  . 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 Bronchiectasis ([place])   . Chronic atrial fibrillation ([place])    a. Dx 11/2013 -> s/p attempted dccv x 2 (11/2013, 11/2014);  b. CHA2DS2VASc = 1-2 (? h/o CHF - uses prn lasix)-->Eliquis 5 bid.  . Chronic systolic (congestive) heart failure ([place])   . Hypertensive heart disease with heart failure ([place])   . Idiopathic progressive neuropathy   . Leukemia ([place])   . Obstructive sleep apnea   . OSA (obstructive sleep apnea)    on CPAP  . Other persistent atrial fibrillation ([place])   . Prediabetes     Past Surgical History:  Procedure Laterality Date  . HEMORRHOID SURGERY    . WRIST FRACTURE SURGERY      Family History  Problem Relation Age of Onset  . Other Other        no premature CAD  . Heart failure Mother   . Heart attack Mother   . Heart attack Father     Social History   Socioeconomic History  . Marital status: Married    Spouse name: Not on file  . Number of children: Not on file  . Years of education: Not on file  . Highest education level: Not on file  Occupational History  . Not on file    Tobacco Use  . Smoking status: Former Smoker    Packs/day: 1.00    Years: 40.00    Pack years: 40.00    Types: Cigarettes  . Smokeless tobacco: Never Used  . Tobacco comment: quit 2013  Substance and Sexual Activity  . Alcohol use: No    Alcohol/week: 0.0 standard drinks    Comment: prev heavy drinker - quit ~ 2013  . Drug use: No  . Sexual activity: Not on file  Other Topics Concern  . Not on file  Social History Narrative   Lives in [place] with his wife.  Very active @ home.   Social Determinants of Health   Financial Resource Strain:   . Difficulty of Paying Living Expenses:   Food Insecurity:   . Worried About Charity fundraiser in the Last Year:   . Arboriculturist in the Last Year:   Transportation Needs:   . Film/video editor (Medical):   Marland Kitchen Lack of Transportation (Non-Medical):   Physical Activity:   . Days of Exercise per Week:   . Minutes of Exercise per Session:   Stress:   . Feeling of Stress :   Social Connections:   . Frequency of Communication with Friends and Family:   . Frequency of Social Gatherings with Friends and  Family:   . Attends Religious Services:   . Active Member of Clubs or Organizations:   . Attends Archivist Meetings:   Marland Kitchen Marital Status:   Intimate Partner Violence:   . Fear of Current or Ex-Partner:   . Emotionally Abused:   Marland Kitchen Physically Abused:   . Sexually Abused:     Outpatient Medications Prior to Visit  Medication Sig Dispense Refill  . acetaminophen (TYLENOL) 325 MG tablet Take by mouth.    Marland Kitchen acyclovir (ZOVIRAX) 400 MG tablet Take 400 mg by mouth 2 (two) times daily.    Marland Kitchen allopurinol (ZYLOPRIM) 300 MG tablet Take 300 mg by mouth daily.    . ARNUITY ELLIPTA 100 MCG/ACT AEPB Inhale 1 puff into the lungs daily.    . ASPIRIN LOW DOSE 81 MG EC tablet Take 81 mg by mouth daily.    . cefpodoxime (VANTIN) 200 MG tablet Take 200 mg by mouth 2 (two) times daily.    . fluconazole (DIFLUCAN) 200 MG tablet     .  fluticasone (FLONASE) 50 MCG/ACT nasal spray Place 1 spray into both nostrils daily.    . furosemide (LASIX) 20 MG tablet Take 40 mg in AM and 20 mg in PM.    . Guaifenesin (MUCINEX MAXIMUM STRENGTH) 1200 MG TB12 Take by mouth.    . INCRUSE ELLIPTA 62.5 MCG/INH AEPB Inhale 1 puff into the lungs daily.    Marland Kitchen loratadine (CLARITIN) 10 MG tablet Take 10 mg by mouth daily.    Marland Kitchen LORazepam (ATIVAN) 1 MG tablet Take by mouth.    . Magnesium Oxide 250 MG TABS Take by mouth.    Marland Kitchen MAGNESIUM PO Take 2 tablets by mouth 2 (two) times daily.    . metolazone (ZAROXOLYN) 2.5 MG tablet Take by mouth.    . pantoprazole (PROTONIX) 40 MG tablet Take 40 mg by mouth daily.    . simethicone (MYLICON) 096 MG chewable tablet Chew by mouth.    . sodium chloride (OCEAN) 0.65 % nasal spray Place into the nose.    . venetoclax 100 MG TABS Take by mouth.    Penne Lash HFA 45 MCG/ACT inhaler SMARTSIG:2 Puff(s) By Mouth Every 8 Hours    . gabapentin (NEURONTIN) 300 MG capsule Take by mouth.    Marland Kitchen amoxicillin-clavulanate (AUGMENTIN) 875-125 MG tablet Take 1 tablet by mouth 2 (two) times daily. 10 tablet 0  . apixaban (ELIQUIS) 5 MG TABS tablet Take 5 mg by mouth 2 (two) times daily.    . Calcium-Vitamin D (CALTRATE 600 PLUS-VIT D PO) Take 1 tablet by mouth 2 (two) times daily.    . fluticasone (FLOVENT HFA) 110 MCG/ACT inhaler Inhale 2 puffs into the lungs 2 (two) times daily.    Marland Kitchen levalbuterol (XOPENEX) 0.63 MG/3ML nebulizer solution Take 3 mLs (0.63 mg total) by nebulization every 8 (eight) hours as needed for wheezing or shortness of breath. 30 mL 2  . LORazepam (ATIVAN) 1 MG tablet Take 1 tablet (1 mg total) by mouth 2 (two) times daily. 60 tablet 3  . metoprolol tartrate (LOPRESSOR) 25 MG tablet Take 2 tablets (50 mg total) by mouth 2 (two) times daily. 120 tablet 1  . montelukast (SINGULAIR) 10 MG tablet Take 10 mg by mouth at bedtime.    . Multiple Vitamins-Minerals (MULTIVITAMIN WITH MINERALS) tablet Take 1 tablet by  mouth daily.    . ranitidine (ZANTAC) 300 MG tablet Take 300 mg by mouth at bedtime.     No facility-administered medications  prior to visit.    Allergies  Allergen Reactions  . Prochlorperazine     Other reaction(s): Delusions (intolerance)  . Codeine Nausea And Vomiting  . Ibuprofen Other (See Comments)    Cannot take bc of Eliquis & platelet counts   . Levaquin [Levofloxacin In D5w]   . Phenergan [Promethazine Hcl]     Wild   . Promethazine     Other reaction(s): Delusions (intolerance) Sleepy, disoriented, amnesia  . Tramadol Nausea And Vomiting  . Other Rash    ROS Review of Systems  Constitutional: Negative.   HENT: Negative.   Eyes: Negative.   Respiratory: Positive for apnea.   Cardiovascular: Negative.   Gastrointestinal: Negative.   Endocrine: Negative.   Genitourinary: Negative.   Musculoskeletal: Negative.   Skin: Negative.   Neurological: Negative.   Psychiatric/Behavioral: Negative.       Objective:    Physical Exam  Constitutional: He is oriented to person, place, and time. He appears well-developed and well-nourished.  HENT:  Head: Normocephalic and atraumatic.  Right Ear: External ear normal.  Left Ear: External ear normal.  Nose: Nose normal.  Capsular cataracts  Eyes: Pupils are equal, round, and reactive to light. Conjunctivae and EOM are normal.  Cardiovascular: Normal rate, regular rhythm, normal heart sounds and intact distal pulses.  Pulmonary/Chest: Effort normal and breath sounds normal.  Abdominal: Soft. Bowel sounds are normal.  Musculoskeletal:        General: Normal range of motion.     Cervical back: Normal range of motion.  Neurological: He is alert and oriented to person, place, and time. He has normal reflexes.  Numbness both feet  Skin: Skin is warm and dry.  Psychiatric: He has a normal mood and affect. His behavior is normal. Judgment and thought content normal.  Vitals reviewed.   BP 118/61   Pulse 86   Temp (!)  97.3 F (36.3 C)   Resp 16   Ht 5\' 10"  (1.778 m)   Wt 201 lb 9.6 oz (91.4 kg)   SpO2 97%   BMI 28.93 kg/m  Wt Readings from Last 3 Encounters:  01/06/20 201 lb 9.6 oz (91.4 kg)  11/05/15 208 lb 8.9 oz (94.6 kg)     Health Maintenance Due  Topic Date Due  . Hepatitis C Screening  Never done  . TETANUS/TDAP  Never done  . COLONOSCOPY  Never done  . PNA vac Low Risk Adult (1 of 2 - PCV13) Never done    There are no preventive care reminders to display for this patient.  No results found for: TSH Lab Results  Component Value Date   WBC 3.5 01/05/2020   HGB 8.8 01/05/2020   HCT 25 (A) 01/05/2020   MCV 99.7 11/03/2015   PLT 112 01/05/2020   Lab Results  Component Value Date   NA 136 11/03/2015   K 4.3 01/05/2020   CO2 25 11/03/2015   GLUCOSE 182 (A) 01/05/2020   BUN 23 01/05/2020   CREATININE 1.21 01/05/2020   BILITOT 1.7 (H) 11/01/2015   ALKPHOS 134 01/05/2020   AST 55 (H) 11/01/2015   ALT 50 11/01/2015   PROT 7.1 11/01/2015   ALBUMIN 2.9 (L) 11/01/2015   CALCIUM 7.9 (L) 11/03/2015   ANIONGAP 5 11/03/2015   No results found for: CHOL No results found for: HDL No results found for: LDLCALC No results found for: TRIG No results found for: CHOLHDL No results found for: HGBA1C    Assessment & Plan:  Problem List Items Addressed This Visit      Cardiovascular and Mediastinum   Hypertensive heart disease with heart failure [facility])    An individual hypertension care plan was established and reinforced today.  The patient's status was assessed using clinical findings on exam and labs or diagnostic tests. The patient's success at meeting treatment goals on disease specific evidence-based guidelines and found to be well controlled. SELF MANAGEMENT: The patient and I together assessed ways to personally work towards obtaining the recommended goals. RECOMMENDATIONS: avoid decongestants found in common cold remedies, decrease consumption of alcohol, perform routine  monitoring of BP with home BP cuff, exercise, reduction of dietary salt, take medicines as prescribed, try not to miss doses and quit smoking.  Regular exercise and maintaining a healthy weight is needed.  Stress reduction may help. A CLINICAL SUMMARY including written plan identify barriers to care unique to individual due to social or financial issues.  We attempt to mutually creat solutions for individual and family understanding.      Relevant Medications   ASPIRIN LOW DOSE 81 MG EC tablet   furosemide (LASIX) 20 MG tablet   metolazone (ZAROXOLYN) 2.5 MG tablet   Chronic systolic (congestive) heart failure (HCC)    An individualized care plan was established and reinforced.  The patient's disease status was assessed using clinical finding son exam today, labs, and/or other diagnostic testing such as x-rays, to determine the patient's success in meeting treatmentgoalsbased on disease-based guidelines and found to beimproving. But not at goal yet. Medications prescriptions no change Laboratory tests ordered to be performed today include none. RECOMMENDATIONS: given include follow up with cardiology.  Call physician is patient gains 3 lbs in one day or 5 lbs for one week.  Call for progressive PND, orthopnea or increased pedal edema.      Relevant Medications   ASPIRIN LOW DOSE 81 MG EC tablet   furosemide (LASIX) 20 MG tablet   metolazone (ZAROXOLYN) 2.5 MG tablet   Other persistent atrial fibrillation (HCC)    AN INDIVIDUAL CARE PLAN was established and reinforced today.  The patient's status was assessed using clinical findings on exam, labs, and other diagnostic testing. Patient's success at meeting treatment goals based on disease specific evidence-bassed guidelines and found to be in gopod control. RECOMMENDATIONS include maintaining present medicines and treatment.      Relevant Medications   ASPIRIN LOW DOSE 81 MG EC tablet   furosemide (LASIX) 20 MG tablet   metolazone  (ZAROXOLYN) 2.5 MG tablet     Respiratory   Obstructive sleep apnea    He is compliant with his CPAP      Squamous cell carcinoma of left lung (HCC) - Primary    He is in remission after radiation therapy      Relevant Medications   acyclovir (ZOVIRAX) 400 MG tablet   allopurinol (ZYLOPRIM) 300 MG tablet   ASPIRIN LOW DOSE 81 MG EC tablet   cefpodoxime (VANTIN) 200 MG tablet   fluconazole (DIFLUCAN) 200 MG tablet   venetoclax 100 MG TABS   LORazepam (ATIVAN) 1 MG tablet     Nervous and Auditory   Idiopathic progressive neuropathy    AN INDIVIDUAL CARE PLAN was established and reinforced today.  The patient's status was assessed using clinical findings on exam, labs, and other diagnostic testing. Patient's success at meeting treatment goals based on disease specific evidence-bassed guidelines and found to be in good control. RECOMMENDATIONS include maintaining present medicines and treatment.  Relevant Medications   gabapentin (NEURONTIN) 300 MG capsule     Other   Prediabetes    He is stable and will get A1c next week at [facility].      BMI 28.0-28.9,adult    He is stable with this weight and continue diet and exercise         Meds ordered this encounter  Medications  . gabapentin (NEURONTIN) 300 MG capsule    Sig: Take 1 capsule (300 mg total) by mouth 2 (two) times daily.    Dispense:  180 capsule    Refill:  3    Follow-up: Return in about 6 months (around 07/08/2020) for fasting.    Reinaldo Meeker, MD

## 2020-01-06 NOTE — Assessment & Plan Note (Signed)
He is in remission after radiation therapy

## 2020-01-07 DIAGNOSIS — D469 Myelodysplastic syndrome, unspecified: Secondary | ICD-10-CM | POA: Diagnosis not present

## 2020-01-07 DIAGNOSIS — Z9484 Stem cells transplant status: Secondary | ICD-10-CM | POA: Diagnosis not present

## 2020-01-07 DIAGNOSIS — Z5111 Encounter for antineoplastic chemotherapy: Secondary | ICD-10-CM | POA: Diagnosis not present

## 2020-01-07 DIAGNOSIS — Z79899 Other long term (current) drug therapy: Secondary | ICD-10-CM | POA: Diagnosis not present

## 2020-01-07 DIAGNOSIS — C9201 Acute myeloblastic leukemia, in remission: Secondary | ICD-10-CM | POA: Diagnosis not present

## 2020-01-08 DIAGNOSIS — C9201 Acute myeloblastic leukemia, in remission: Secondary | ICD-10-CM | POA: Diagnosis not present

## 2020-01-08 DIAGNOSIS — Z79899 Other long term (current) drug therapy: Secondary | ICD-10-CM | POA: Diagnosis not present

## 2020-01-08 DIAGNOSIS — D469 Myelodysplastic syndrome, unspecified: Secondary | ICD-10-CM | POA: Diagnosis not present

## 2020-01-08 DIAGNOSIS — Z5111 Encounter for antineoplastic chemotherapy: Secondary | ICD-10-CM | POA: Diagnosis not present

## 2020-01-08 DIAGNOSIS — Z9484 Stem cells transplant status: Secondary | ICD-10-CM | POA: Diagnosis not present

## 2020-01-09 DIAGNOSIS — Z79899 Other long term (current) drug therapy: Secondary | ICD-10-CM | POA: Diagnosis not present

## 2020-01-09 DIAGNOSIS — Z9484 Stem cells transplant status: Secondary | ICD-10-CM | POA: Diagnosis not present

## 2020-01-09 DIAGNOSIS — C9201 Acute myeloblastic leukemia, in remission: Secondary | ICD-10-CM | POA: Diagnosis not present

## 2020-01-09 DIAGNOSIS — D469 Myelodysplastic syndrome, unspecified: Secondary | ICD-10-CM | POA: Diagnosis not present

## 2020-01-09 DIAGNOSIS — Z5111 Encounter for antineoplastic chemotherapy: Secondary | ICD-10-CM | POA: Diagnosis not present

## 2020-01-13 DIAGNOSIS — Z9481 Bone marrow transplant status: Secondary | ICD-10-CM | POA: Diagnosis not present

## 2020-01-13 DIAGNOSIS — D469 Myelodysplastic syndrome, unspecified: Secondary | ICD-10-CM | POA: Diagnosis not present

## 2020-01-13 DIAGNOSIS — C9201 Acute myeloblastic leukemia, in remission: Secondary | ICD-10-CM | POA: Diagnosis not present

## 2020-01-16 DIAGNOSIS — Z9481 Bone marrow transplant status: Secondary | ICD-10-CM | POA: Diagnosis not present

## 2020-01-16 DIAGNOSIS — C9201 Acute myeloblastic leukemia, in remission: Secondary | ICD-10-CM | POA: Diagnosis not present

## 2020-01-16 DIAGNOSIS — D469 Myelodysplastic syndrome, unspecified: Secondary | ICD-10-CM | POA: Diagnosis not present

## 2020-01-19 DIAGNOSIS — Z9481 Bone marrow transplant status: Secondary | ICD-10-CM | POA: Diagnosis not present

## 2020-01-19 DIAGNOSIS — C9201 Acute myeloblastic leukemia, in remission: Secondary | ICD-10-CM | POA: Diagnosis not present

## 2020-01-19 DIAGNOSIS — D469 Myelodysplastic syndrome, unspecified: Secondary | ICD-10-CM | POA: Diagnosis not present

## 2020-01-21 DIAGNOSIS — D469 Myelodysplastic syndrome, unspecified: Secondary | ICD-10-CM | POA: Diagnosis not present

## 2020-01-21 DIAGNOSIS — R0609 Other forms of dyspnea: Secondary | ICD-10-CM | POA: Diagnosis not present

## 2020-01-21 DIAGNOSIS — F17210 Nicotine dependence, cigarettes, uncomplicated: Secondary | ICD-10-CM | POA: Diagnosis not present

## 2020-01-21 DIAGNOSIS — Z9481 Bone marrow transplant status: Secondary | ICD-10-CM | POA: Diagnosis not present

## 2020-01-21 DIAGNOSIS — C9201 Acute myeloblastic leukemia, in remission: Secondary | ICD-10-CM | POA: Diagnosis not present

## 2020-01-23 DIAGNOSIS — C9201 Acute myeloblastic leukemia, in remission: Secondary | ICD-10-CM | POA: Diagnosis not present

## 2020-01-23 DIAGNOSIS — Z9481 Bone marrow transplant status: Secondary | ICD-10-CM | POA: Diagnosis not present

## 2020-01-23 DIAGNOSIS — D469 Myelodysplastic syndrome, unspecified: Secondary | ICD-10-CM | POA: Diagnosis not present

## 2020-01-26 DIAGNOSIS — D469 Myelodysplastic syndrome, unspecified: Secondary | ICD-10-CM | POA: Diagnosis not present

## 2020-01-26 DIAGNOSIS — C9201 Acute myeloblastic leukemia, in remission: Secondary | ICD-10-CM | POA: Diagnosis not present

## 2020-01-26 DIAGNOSIS — R0689 Other abnormalities of breathing: Secondary | ICD-10-CM | POA: Diagnosis not present

## 2020-01-26 DIAGNOSIS — Z9481 Bone marrow transplant status: Secondary | ICD-10-CM | POA: Diagnosis not present

## 2020-01-30 DIAGNOSIS — I482 Chronic atrial fibrillation, unspecified: Secondary | ICD-10-CM | POA: Diagnosis not present

## 2020-01-30 DIAGNOSIS — D709 Neutropenia, unspecified: Secondary | ICD-10-CM | POA: Diagnosis not present

## 2020-01-30 DIAGNOSIS — D61818 Other pancytopenia: Secondary | ICD-10-CM | POA: Diagnosis not present

## 2020-01-30 DIAGNOSIS — R5081 Fever presenting with conditions classified elsewhere: Secondary | ICD-10-CM | POA: Diagnosis not present

## 2020-01-30 DIAGNOSIS — C9200 Acute myeloblastic leukemia, not having achieved remission: Secondary | ICD-10-CM | POA: Diagnosis not present

## 2020-02-06 DIAGNOSIS — A4152 Sepsis due to Pseudomonas: Secondary | ICD-10-CM | POA: Diagnosis not present

## 2020-02-06 DIAGNOSIS — I509 Heart failure, unspecified: Secondary | ICD-10-CM | POA: Diagnosis not present

## 2020-02-06 DIAGNOSIS — G4733 Obstructive sleep apnea (adult) (pediatric): Secondary | ICD-10-CM | POA: Diagnosis not present

## 2020-02-06 DIAGNOSIS — C9201 Acute myeloblastic leukemia, in remission: Secondary | ICD-10-CM | POA: Diagnosis not present

## 2020-02-06 DIAGNOSIS — Z7951 Long term (current) use of inhaled steroids: Secondary | ICD-10-CM | POA: Diagnosis not present

## 2020-02-06 DIAGNOSIS — Z792 Long term (current) use of antibiotics: Secondary | ICD-10-CM | POA: Diagnosis not present

## 2020-02-06 DIAGNOSIS — Z7901 Long term (current) use of anticoagulants: Secondary | ICD-10-CM | POA: Diagnosis not present

## 2020-02-06 DIAGNOSIS — R5081 Fever presenting with conditions classified elsewhere: Secondary | ICD-10-CM | POA: Diagnosis not present

## 2020-02-06 DIAGNOSIS — J329 Chronic sinusitis, unspecified: Secondary | ICD-10-CM | POA: Diagnosis not present

## 2020-02-06 DIAGNOSIS — Z87891 Personal history of nicotine dependence: Secondary | ICD-10-CM | POA: Diagnosis not present

## 2020-02-06 DIAGNOSIS — Z952 Presence of prosthetic heart valve: Secondary | ICD-10-CM | POA: Diagnosis not present

## 2020-02-06 DIAGNOSIS — Z85118 Personal history of other malignant neoplasm of bronchus and lung: Secondary | ICD-10-CM | POA: Diagnosis not present

## 2020-02-06 DIAGNOSIS — D7581 Myelofibrosis: Secondary | ICD-10-CM | POA: Diagnosis not present

## 2020-02-06 DIAGNOSIS — T80212A Local infection due to central venous catheter, initial encounter: Secondary | ICD-10-CM | POA: Diagnosis not present

## 2020-02-06 DIAGNOSIS — Z950 Presence of cardiac pacemaker: Secondary | ICD-10-CM | POA: Diagnosis not present

## 2020-02-06 DIAGNOSIS — I4891 Unspecified atrial fibrillation: Secondary | ICD-10-CM | POA: Diagnosis not present

## 2020-02-06 DIAGNOSIS — Z9181 History of falling: Secondary | ICD-10-CM | POA: Diagnosis not present

## 2020-02-06 DIAGNOSIS — Z8701 Personal history of pneumonia (recurrent): Secondary | ICD-10-CM | POA: Diagnosis not present

## 2020-02-06 DIAGNOSIS — Z7982 Long term (current) use of aspirin: Secondary | ICD-10-CM | POA: Diagnosis not present

## 2020-02-06 DIAGNOSIS — D709 Neutropenia, unspecified: Secondary | ICD-10-CM | POA: Diagnosis not present

## 2020-02-12 DIAGNOSIS — I509 Heart failure, unspecified: Secondary | ICD-10-CM | POA: Diagnosis not present

## 2020-02-12 DIAGNOSIS — D709 Neutropenia, unspecified: Secondary | ICD-10-CM | POA: Diagnosis not present

## 2020-02-12 DIAGNOSIS — R5081 Fever presenting with conditions classified elsewhere: Secondary | ICD-10-CM | POA: Diagnosis not present

## 2020-02-12 DIAGNOSIS — A4152 Sepsis due to Pseudomonas: Secondary | ICD-10-CM | POA: Diagnosis not present

## 2020-02-12 DIAGNOSIS — T80212A Local infection due to central venous catheter, initial encounter: Secondary | ICD-10-CM | POA: Diagnosis not present

## 2020-02-12 DIAGNOSIS — C9201 Acute myeloblastic leukemia, in remission: Secondary | ICD-10-CM | POA: Diagnosis not present

## 2020-02-17 DIAGNOSIS — A4152 Sepsis due to Pseudomonas: Secondary | ICD-10-CM | POA: Diagnosis not present

## 2020-02-17 DIAGNOSIS — C9201 Acute myeloblastic leukemia, in remission: Secondary | ICD-10-CM | POA: Diagnosis not present

## 2020-02-17 DIAGNOSIS — T80212A Local infection due to central venous catheter, initial encounter: Secondary | ICD-10-CM | POA: Diagnosis not present

## 2020-02-17 DIAGNOSIS — C9200 Acute myeloblastic leukemia, not having achieved remission: Secondary | ICD-10-CM | POA: Diagnosis not present

## 2020-02-17 DIAGNOSIS — D709 Neutropenia, unspecified: Secondary | ICD-10-CM | POA: Diagnosis not present

## 2020-02-17 DIAGNOSIS — R7881 Bacteremia: Secondary | ICD-10-CM | POA: Diagnosis not present

## 2020-02-17 DIAGNOSIS — I509 Heart failure, unspecified: Secondary | ICD-10-CM | POA: Diagnosis not present

## 2020-02-17 DIAGNOSIS — R5081 Fever presenting with conditions classified elsewhere: Secondary | ICD-10-CM | POA: Diagnosis not present

## 2020-02-19 DIAGNOSIS — T80212A Local infection due to central venous catheter, initial encounter: Secondary | ICD-10-CM | POA: Diagnosis not present

## 2020-02-19 DIAGNOSIS — A4152 Sepsis due to Pseudomonas: Secondary | ICD-10-CM | POA: Diagnosis not present

## 2020-02-19 DIAGNOSIS — C9200 Acute myeloblastic leukemia, not having achieved remission: Secondary | ICD-10-CM | POA: Diagnosis not present

## 2020-02-19 DIAGNOSIS — R5081 Fever presenting with conditions classified elsewhere: Secondary | ICD-10-CM | POA: Diagnosis not present

## 2020-02-19 DIAGNOSIS — D709 Neutropenia, unspecified: Secondary | ICD-10-CM | POA: Diagnosis not present

## 2020-02-19 DIAGNOSIS — R7881 Bacteremia: Secondary | ICD-10-CM | POA: Diagnosis not present

## 2020-02-19 DIAGNOSIS — C9201 Acute myeloblastic leukemia, in remission: Secondary | ICD-10-CM | POA: Diagnosis not present

## 2020-02-19 DIAGNOSIS — I509 Heart failure, unspecified: Secondary | ICD-10-CM | POA: Diagnosis not present

## 2020-02-24 DIAGNOSIS — D469 Myelodysplastic syndrome, unspecified: Secondary | ICD-10-CM | POA: Diagnosis not present

## 2020-02-24 DIAGNOSIS — D7589 Other specified diseases of blood and blood-forming organs: Secondary | ICD-10-CM | POA: Diagnosis not present

## 2020-02-24 DIAGNOSIS — Z09 Encounter for follow-up examination after completed treatment for conditions other than malignant neoplasm: Secondary | ICD-10-CM | POA: Diagnosis not present

## 2020-02-24 DIAGNOSIS — Z881 Allergy status to other antibiotic agents status: Secondary | ICD-10-CM | POA: Diagnosis not present

## 2020-02-24 DIAGNOSIS — J189 Pneumonia, unspecified organism: Secondary | ICD-10-CM | POA: Diagnosis not present

## 2020-02-24 DIAGNOSIS — Z8249 Family history of ischemic heart disease and other diseases of the circulatory system: Secondary | ICD-10-CM | POA: Diagnosis not present

## 2020-02-24 DIAGNOSIS — Z950 Presence of cardiac pacemaker: Secondary | ICD-10-CM | POA: Diagnosis not present

## 2020-02-24 DIAGNOSIS — I5023 Acute on chronic systolic (congestive) heart failure: Secondary | ICD-10-CM | POA: Diagnosis not present

## 2020-02-24 DIAGNOSIS — Z87891 Personal history of nicotine dependence: Secondary | ICD-10-CM | POA: Diagnosis not present

## 2020-02-24 DIAGNOSIS — R030 Elevated blood-pressure reading, without diagnosis of hypertension: Secondary | ICD-10-CM | POA: Diagnosis not present

## 2020-02-24 DIAGNOSIS — C9200 Acute myeloblastic leukemia, not having achieved remission: Secondary | ICD-10-CM | POA: Diagnosis not present

## 2020-02-24 DIAGNOSIS — C9201 Acute myeloblastic leukemia, in remission: Secondary | ICD-10-CM | POA: Diagnosis not present

## 2020-02-24 DIAGNOSIS — Z888 Allergy status to other drugs, medicaments and biological substances status: Secondary | ICD-10-CM | POA: Diagnosis not present

## 2020-02-24 DIAGNOSIS — C3412 Malignant neoplasm of upper lobe, left bronchus or lung: Secondary | ICD-10-CM | POA: Diagnosis not present

## 2020-02-24 DIAGNOSIS — R718 Other abnormality of red blood cells: Secondary | ICD-10-CM | POA: Diagnosis not present

## 2020-02-24 DIAGNOSIS — I4821 Permanent atrial fibrillation: Secondary | ICD-10-CM | POA: Diagnosis not present

## 2020-02-24 DIAGNOSIS — G4733 Obstructive sleep apnea (adult) (pediatric): Secondary | ICD-10-CM | POA: Diagnosis not present

## 2020-02-24 DIAGNOSIS — R7881 Bacteremia: Secondary | ICD-10-CM | POA: Diagnosis not present

## 2020-02-24 DIAGNOSIS — I513 Intracardiac thrombosis, not elsewhere classified: Secondary | ICD-10-CM | POA: Diagnosis not present

## 2020-02-24 DIAGNOSIS — Z8349 Family history of other endocrine, nutritional and metabolic diseases: Secondary | ICD-10-CM | POA: Diagnosis not present

## 2020-02-24 DIAGNOSIS — Z7982 Long term (current) use of aspirin: Secondary | ICD-10-CM | POA: Diagnosis not present

## 2020-02-24 DIAGNOSIS — Z9889 Other specified postprocedural states: Secondary | ICD-10-CM | POA: Diagnosis not present

## 2020-02-24 DIAGNOSIS — Z885 Allergy status to narcotic agent status: Secondary | ICD-10-CM | POA: Diagnosis not present

## 2020-02-24 DIAGNOSIS — B965 Pseudomonas (aeruginosa) (mallei) (pseudomallei) as the cause of diseases classified elsewhere: Secondary | ICD-10-CM | POA: Diagnosis not present

## 2020-02-24 DIAGNOSIS — Z79899 Other long term (current) drug therapy: Secondary | ICD-10-CM | POA: Diagnosis not present

## 2020-02-24 DIAGNOSIS — J9601 Acute respiratory failure with hypoxia: Secondary | ICD-10-CM | POA: Diagnosis not present

## 2020-02-24 DIAGNOSIS — D759 Disease of blood and blood-forming organs, unspecified: Secondary | ICD-10-CM | POA: Diagnosis not present

## 2020-02-24 DIAGNOSIS — Z886 Allergy status to analgesic agent status: Secondary | ICD-10-CM | POA: Diagnosis not present

## 2020-02-24 DIAGNOSIS — C3492 Malignant neoplasm of unspecified part of left bronchus or lung: Secondary | ICD-10-CM | POA: Diagnosis not present

## 2020-02-24 DIAGNOSIS — Z9481 Bone marrow transplant status: Secondary | ICD-10-CM | POA: Diagnosis not present

## 2020-02-24 DIAGNOSIS — Z7901 Long term (current) use of anticoagulants: Secondary | ICD-10-CM | POA: Diagnosis not present

## 2020-02-24 DIAGNOSIS — Z792 Long term (current) use of antibiotics: Secondary | ICD-10-CM | POA: Diagnosis not present

## 2020-02-24 DIAGNOSIS — Z8701 Personal history of pneumonia (recurrent): Secondary | ICD-10-CM | POA: Diagnosis not present

## 2020-02-24 DIAGNOSIS — Z952 Presence of prosthetic heart valve: Secondary | ICD-10-CM | POA: Diagnosis not present

## 2020-02-24 DIAGNOSIS — D539 Nutritional anemia, unspecified: Secondary | ICD-10-CM | POA: Diagnosis not present

## 2020-02-25 DIAGNOSIS — Z9481 Bone marrow transplant status: Secondary | ICD-10-CM | POA: Diagnosis not present

## 2020-02-25 DIAGNOSIS — C9200 Acute myeloblastic leukemia, not having achieved remission: Secondary | ICD-10-CM | POA: Diagnosis not present

## 2020-02-26 DIAGNOSIS — D709 Neutropenia, unspecified: Secondary | ICD-10-CM | POA: Diagnosis not present

## 2020-02-26 DIAGNOSIS — R7881 Bacteremia: Secondary | ICD-10-CM | POA: Diagnosis not present

## 2020-02-26 DIAGNOSIS — A4152 Sepsis due to Pseudomonas: Secondary | ICD-10-CM | POA: Diagnosis not present

## 2020-02-26 DIAGNOSIS — T80212A Local infection due to central venous catheter, initial encounter: Secondary | ICD-10-CM | POA: Diagnosis not present

## 2020-02-26 DIAGNOSIS — C9201 Acute myeloblastic leukemia, in remission: Secondary | ICD-10-CM | POA: Diagnosis not present

## 2020-02-26 DIAGNOSIS — I509 Heart failure, unspecified: Secondary | ICD-10-CM | POA: Diagnosis not present

## 2020-02-26 DIAGNOSIS — R5081 Fever presenting with conditions classified elsewhere: Secondary | ICD-10-CM | POA: Diagnosis not present

## 2020-02-26 DIAGNOSIS — C9200 Acute myeloblastic leukemia, not having achieved remission: Secondary | ICD-10-CM | POA: Diagnosis not present

## 2020-03-02 DIAGNOSIS — D709 Neutropenia, unspecified: Secondary | ICD-10-CM | POA: Diagnosis not present

## 2020-03-02 DIAGNOSIS — C9201 Acute myeloblastic leukemia, in remission: Secondary | ICD-10-CM | POA: Diagnosis not present

## 2020-03-02 DIAGNOSIS — I509 Heart failure, unspecified: Secondary | ICD-10-CM | POA: Diagnosis not present

## 2020-03-02 DIAGNOSIS — C9200 Acute myeloblastic leukemia, not having achieved remission: Secondary | ICD-10-CM | POA: Diagnosis not present

## 2020-03-02 DIAGNOSIS — T80212A Local infection due to central venous catheter, initial encounter: Secondary | ICD-10-CM | POA: Diagnosis not present

## 2020-03-02 DIAGNOSIS — R7881 Bacteremia: Secondary | ICD-10-CM | POA: Diagnosis not present

## 2020-03-02 DIAGNOSIS — A4152 Sepsis due to Pseudomonas: Secondary | ICD-10-CM | POA: Diagnosis not present

## 2020-03-02 DIAGNOSIS — R5081 Fever presenting with conditions classified elsewhere: Secondary | ICD-10-CM | POA: Diagnosis not present

## 2020-03-04 DIAGNOSIS — R5081 Fever presenting with conditions classified elsewhere: Secondary | ICD-10-CM | POA: Diagnosis not present

## 2020-03-04 DIAGNOSIS — C9201 Acute myeloblastic leukemia, in remission: Secondary | ICD-10-CM | POA: Diagnosis not present

## 2020-03-04 DIAGNOSIS — C9200 Acute myeloblastic leukemia, not having achieved remission: Secondary | ICD-10-CM | POA: Diagnosis not present

## 2020-03-04 DIAGNOSIS — D709 Neutropenia, unspecified: Secondary | ICD-10-CM | POA: Diagnosis not present

## 2020-03-04 DIAGNOSIS — I509 Heart failure, unspecified: Secondary | ICD-10-CM | POA: Diagnosis not present

## 2020-03-04 DIAGNOSIS — A4152 Sepsis due to Pseudomonas: Secondary | ICD-10-CM | POA: Diagnosis not present

## 2020-03-04 DIAGNOSIS — T80212A Local infection due to central venous catheter, initial encounter: Secondary | ICD-10-CM | POA: Diagnosis not present

## 2020-03-04 DIAGNOSIS — R7881 Bacteremia: Secondary | ICD-10-CM | POA: Diagnosis not present

## 2020-03-07 DIAGNOSIS — Z9181 History of falling: Secondary | ICD-10-CM | POA: Diagnosis not present

## 2020-03-07 DIAGNOSIS — R5081 Fever presenting with conditions classified elsewhere: Secondary | ICD-10-CM | POA: Diagnosis not present

## 2020-03-07 DIAGNOSIS — A4152 Sepsis due to Pseudomonas: Secondary | ICD-10-CM | POA: Diagnosis not present

## 2020-03-07 DIAGNOSIS — Z8701 Personal history of pneumonia (recurrent): Secondary | ICD-10-CM | POA: Diagnosis not present

## 2020-03-07 DIAGNOSIS — I509 Heart failure, unspecified: Secondary | ICD-10-CM | POA: Diagnosis not present

## 2020-03-07 DIAGNOSIS — J329 Chronic sinusitis, unspecified: Secondary | ICD-10-CM | POA: Diagnosis not present

## 2020-03-07 DIAGNOSIS — Z7951 Long term (current) use of inhaled steroids: Secondary | ICD-10-CM | POA: Diagnosis not present

## 2020-03-07 DIAGNOSIS — Z87891 Personal history of nicotine dependence: Secondary | ICD-10-CM | POA: Diagnosis not present

## 2020-03-07 DIAGNOSIS — D7581 Myelofibrosis: Secondary | ICD-10-CM | POA: Diagnosis not present

## 2020-03-07 DIAGNOSIS — Z85118 Personal history of other malignant neoplasm of bronchus and lung: Secondary | ICD-10-CM | POA: Diagnosis not present

## 2020-03-07 DIAGNOSIS — T80212A Local infection due to central venous catheter, initial encounter: Secondary | ICD-10-CM | POA: Diagnosis not present

## 2020-03-07 DIAGNOSIS — C9201 Acute myeloblastic leukemia, in remission: Secondary | ICD-10-CM | POA: Diagnosis not present

## 2020-03-07 DIAGNOSIS — I4891 Unspecified atrial fibrillation: Secondary | ICD-10-CM | POA: Diagnosis not present

## 2020-03-07 DIAGNOSIS — Z952 Presence of prosthetic heart valve: Secondary | ICD-10-CM | POA: Diagnosis not present

## 2020-03-07 DIAGNOSIS — D709 Neutropenia, unspecified: Secondary | ICD-10-CM | POA: Diagnosis not present

## 2020-03-07 DIAGNOSIS — Z950 Presence of cardiac pacemaker: Secondary | ICD-10-CM | POA: Diagnosis not present

## 2020-03-07 DIAGNOSIS — Z7982 Long term (current) use of aspirin: Secondary | ICD-10-CM | POA: Diagnosis not present

## 2020-03-07 DIAGNOSIS — G4733 Obstructive sleep apnea (adult) (pediatric): Secondary | ICD-10-CM | POA: Diagnosis not present

## 2020-03-07 DIAGNOSIS — Z792 Long term (current) use of antibiotics: Secondary | ICD-10-CM | POA: Diagnosis not present

## 2020-03-07 DIAGNOSIS — Z7901 Long term (current) use of anticoagulants: Secondary | ICD-10-CM | POA: Diagnosis not present

## 2020-03-08 DIAGNOSIS — Z9484 Stem cells transplant status: Secondary | ICD-10-CM | POA: Diagnosis not present

## 2020-03-08 DIAGNOSIS — J479 Bronchiectasis, uncomplicated: Secondary | ICD-10-CM | POA: Diagnosis not present

## 2020-03-08 DIAGNOSIS — Z9481 Bone marrow transplant status: Secondary | ICD-10-CM | POA: Diagnosis not present

## 2020-03-08 DIAGNOSIS — C3492 Malignant neoplasm of unspecified part of left bronchus or lung: Secondary | ICD-10-CM | POA: Diagnosis not present

## 2020-03-08 DIAGNOSIS — Z7901 Long term (current) use of anticoagulants: Secondary | ICD-10-CM | POA: Diagnosis not present

## 2020-03-08 DIAGNOSIS — B999 Unspecified infectious disease: Secondary | ICD-10-CM | POA: Diagnosis not present

## 2020-03-08 DIAGNOSIS — I330 Acute and subacute infective endocarditis: Secondary | ICD-10-CM | POA: Diagnosis not present

## 2020-03-08 DIAGNOSIS — Z7982 Long term (current) use of aspirin: Secondary | ICD-10-CM | POA: Diagnosis not present

## 2020-03-08 DIAGNOSIS — D84822 Immunodeficiency due to external causes: Secondary | ICD-10-CM | POA: Diagnosis not present

## 2020-03-08 DIAGNOSIS — B965 Pseudomonas (aeruginosa) (mallei) (pseudomallei) as the cause of diseases classified elsewhere: Secondary | ICD-10-CM | POA: Diagnosis not present

## 2020-03-08 DIAGNOSIS — Z66 Do not resuscitate: Secondary | ICD-10-CM | POA: Diagnosis not present

## 2020-03-08 DIAGNOSIS — Z952 Presence of prosthetic heart valve: Secondary | ICD-10-CM | POA: Diagnosis not present

## 2020-03-08 DIAGNOSIS — C9200 Acute myeloblastic leukemia, not having achieved remission: Secondary | ICD-10-CM | POA: Diagnosis not present

## 2020-03-08 DIAGNOSIS — Z79899 Other long term (current) drug therapy: Secondary | ICD-10-CM | POA: Diagnosis not present

## 2020-03-08 DIAGNOSIS — D709 Neutropenia, unspecified: Secondary | ICD-10-CM | POA: Diagnosis not present

## 2020-03-11 DIAGNOSIS — D709 Neutropenia, unspecified: Secondary | ICD-10-CM | POA: Diagnosis not present

## 2020-03-11 DIAGNOSIS — T80212A Local infection due to central venous catheter, initial encounter: Secondary | ICD-10-CM | POA: Diagnosis not present

## 2020-03-11 DIAGNOSIS — I509 Heart failure, unspecified: Secondary | ICD-10-CM | POA: Diagnosis not present

## 2020-03-11 DIAGNOSIS — C9200 Acute myeloblastic leukemia, not having achieved remission: Secondary | ICD-10-CM | POA: Diagnosis not present

## 2020-03-11 DIAGNOSIS — A4152 Sepsis due to Pseudomonas: Secondary | ICD-10-CM | POA: Diagnosis not present

## 2020-03-11 DIAGNOSIS — R5081 Fever presenting with conditions classified elsewhere: Secondary | ICD-10-CM | POA: Diagnosis not present

## 2020-03-11 DIAGNOSIS — R7881 Bacteremia: Secondary | ICD-10-CM | POA: Diagnosis not present

## 2020-03-11 DIAGNOSIS — C9201 Acute myeloblastic leukemia, in remission: Secondary | ICD-10-CM | POA: Diagnosis not present

## 2020-03-13 DIAGNOSIS — D709 Neutropenia, unspecified: Secondary | ICD-10-CM | POA: Diagnosis not present

## 2020-03-13 DIAGNOSIS — A4152 Sepsis due to Pseudomonas: Secondary | ICD-10-CM | POA: Diagnosis not present

## 2020-03-13 DIAGNOSIS — T80212A Local infection due to central venous catheter, initial encounter: Secondary | ICD-10-CM | POA: Diagnosis not present

## 2020-03-13 DIAGNOSIS — C9201 Acute myeloblastic leukemia, in remission: Secondary | ICD-10-CM | POA: Diagnosis not present

## 2020-03-13 DIAGNOSIS — I509 Heart failure, unspecified: Secondary | ICD-10-CM | POA: Diagnosis not present

## 2020-03-13 DIAGNOSIS — R5081 Fever presenting with conditions classified elsewhere: Secondary | ICD-10-CM | POA: Diagnosis not present

## 2020-03-16 DIAGNOSIS — D469 Myelodysplastic syndrome, unspecified: Secondary | ICD-10-CM | POA: Diagnosis not present

## 2020-03-16 DIAGNOSIS — I4821 Permanent atrial fibrillation: Secondary | ICD-10-CM | POA: Diagnosis not present

## 2020-03-16 DIAGNOSIS — Z9481 Bone marrow transplant status: Secondary | ICD-10-CM | POA: Diagnosis not present

## 2020-03-16 DIAGNOSIS — I97190 Other postprocedural cardiac functional disturbances following cardiac surgery: Secondary | ICD-10-CM | POA: Diagnosis not present

## 2020-03-16 DIAGNOSIS — Z952 Presence of prosthetic heart valve: Secondary | ICD-10-CM | POA: Diagnosis not present

## 2020-03-16 DIAGNOSIS — Z950 Presence of cardiac pacemaker: Secondary | ICD-10-CM | POA: Diagnosis not present

## 2020-03-16 DIAGNOSIS — C9201 Acute myeloblastic leukemia, in remission: Secondary | ICD-10-CM | POA: Diagnosis not present

## 2020-03-16 DIAGNOSIS — I442 Atrioventricular block, complete: Secondary | ICD-10-CM | POA: Diagnosis not present

## 2020-03-18 DIAGNOSIS — I97190 Other postprocedural cardiac functional disturbances following cardiac surgery: Secondary | ICD-10-CM | POA: Diagnosis not present

## 2020-03-18 DIAGNOSIS — I442 Atrioventricular block, complete: Secondary | ICD-10-CM | POA: Diagnosis not present

## 2020-03-18 DIAGNOSIS — Z45018 Encounter for adjustment and management of other part of cardiac pacemaker: Secondary | ICD-10-CM | POA: Diagnosis not present

## 2020-03-18 DIAGNOSIS — Z87891 Personal history of nicotine dependence: Secondary | ICD-10-CM | POA: Diagnosis not present

## 2020-03-18 DIAGNOSIS — Z952 Presence of prosthetic heart valve: Secondary | ICD-10-CM | POA: Diagnosis not present

## 2020-03-18 DIAGNOSIS — I4821 Permanent atrial fibrillation: Secondary | ICD-10-CM | POA: Diagnosis not present

## 2020-03-18 DIAGNOSIS — J411 Mucopurulent chronic bronchitis: Secondary | ICD-10-CM | POA: Diagnosis not present

## 2020-03-18 DIAGNOSIS — C9200 Acute myeloblastic leukemia, not having achieved remission: Secondary | ICD-10-CM | POA: Diagnosis not present

## 2020-03-18 DIAGNOSIS — I4891 Unspecified atrial fibrillation: Secondary | ICD-10-CM | POA: Diagnosis not present

## 2020-03-18 DIAGNOSIS — Z7901 Long term (current) use of anticoagulants: Secondary | ICD-10-CM | POA: Diagnosis not present

## 2020-03-18 DIAGNOSIS — Z950 Presence of cardiac pacemaker: Secondary | ICD-10-CM | POA: Diagnosis not present

## 2020-03-19 DIAGNOSIS — I058 Other rheumatic mitral valve diseases: Secondary | ICD-10-CM | POA: Diagnosis not present

## 2020-03-19 DIAGNOSIS — Z950 Presence of cardiac pacemaker: Secondary | ICD-10-CM | POA: Diagnosis not present

## 2020-03-19 DIAGNOSIS — I4821 Permanent atrial fibrillation: Secondary | ICD-10-CM | POA: Diagnosis not present

## 2020-03-19 DIAGNOSIS — I97190 Other postprocedural cardiac functional disturbances following cardiac surgery: Secondary | ICD-10-CM | POA: Diagnosis not present

## 2020-03-19 DIAGNOSIS — I442 Atrioventricular block, complete: Secondary | ICD-10-CM | POA: Diagnosis not present

## 2020-03-19 DIAGNOSIS — Z952 Presence of prosthetic heart valve: Secondary | ICD-10-CM | POA: Diagnosis not present

## 2020-03-19 DIAGNOSIS — I313 Pericardial effusion (noninflammatory): Secondary | ICD-10-CM | POA: Diagnosis not present

## 2020-03-19 DIAGNOSIS — I051 Rheumatic mitral insufficiency: Secondary | ICD-10-CM | POA: Diagnosis not present

## 2020-03-22 DIAGNOSIS — Z452 Encounter for adjustment and management of vascular access device: Secondary | ICD-10-CM | POA: Diagnosis not present

## 2020-03-22 DIAGNOSIS — Z5111 Encounter for antineoplastic chemotherapy: Secondary | ICD-10-CM | POA: Diagnosis not present

## 2020-03-22 DIAGNOSIS — Z9481 Bone marrow transplant status: Secondary | ICD-10-CM | POA: Diagnosis not present

## 2020-03-22 DIAGNOSIS — Z789 Other specified health status: Secondary | ICD-10-CM | POA: Diagnosis not present

## 2020-03-22 DIAGNOSIS — C9201 Acute myeloblastic leukemia, in remission: Secondary | ICD-10-CM | POA: Diagnosis not present

## 2020-03-23 DIAGNOSIS — Z5111 Encounter for antineoplastic chemotherapy: Secondary | ICD-10-CM | POA: Diagnosis not present

## 2020-03-23 DIAGNOSIS — C9201 Acute myeloblastic leukemia, in remission: Secondary | ICD-10-CM | POA: Diagnosis not present

## 2020-03-24 DIAGNOSIS — C9201 Acute myeloblastic leukemia, in remission: Secondary | ICD-10-CM | POA: Diagnosis not present

## 2020-03-24 DIAGNOSIS — Z5111 Encounter for antineoplastic chemotherapy: Secondary | ICD-10-CM | POA: Diagnosis not present

## 2020-03-25 DIAGNOSIS — C9201 Acute myeloblastic leukemia, in remission: Secondary | ICD-10-CM | POA: Diagnosis not present

## 2020-03-25 DIAGNOSIS — Z9481 Bone marrow transplant status: Secondary | ICD-10-CM | POA: Diagnosis not present

## 2020-03-25 DIAGNOSIS — Z5111 Encounter for antineoplastic chemotherapy: Secondary | ICD-10-CM | POA: Diagnosis not present

## 2020-03-26 DIAGNOSIS — Z5111 Encounter for antineoplastic chemotherapy: Secondary | ICD-10-CM | POA: Diagnosis not present

## 2020-03-26 DIAGNOSIS — Z9481 Bone marrow transplant status: Secondary | ICD-10-CM | POA: Diagnosis not present

## 2020-03-26 DIAGNOSIS — C9201 Acute myeloblastic leukemia, in remission: Secondary | ICD-10-CM | POA: Diagnosis not present

## 2020-03-29 DIAGNOSIS — Z9481 Bone marrow transplant status: Secondary | ICD-10-CM | POA: Diagnosis not present

## 2020-03-29 DIAGNOSIS — C9201 Acute myeloblastic leukemia, in remission: Secondary | ICD-10-CM | POA: Diagnosis not present

## 2020-03-29 DIAGNOSIS — D469 Myelodysplastic syndrome, unspecified: Secondary | ICD-10-CM | POA: Diagnosis not present

## 2020-04-05 DIAGNOSIS — D469 Myelodysplastic syndrome, unspecified: Secondary | ICD-10-CM | POA: Diagnosis not present

## 2020-04-05 DIAGNOSIS — Z66 Do not resuscitate: Secondary | ICD-10-CM | POA: Diagnosis not present

## 2020-04-05 DIAGNOSIS — Z79899 Other long term (current) drug therapy: Secondary | ICD-10-CM | POA: Diagnosis not present

## 2020-04-05 DIAGNOSIS — M545 Low back pain: Secondary | ICD-10-CM | POA: Diagnosis not present

## 2020-04-05 DIAGNOSIS — C9200 Acute myeloblastic leukemia, not having achieved remission: Secondary | ICD-10-CM | POA: Diagnosis not present

## 2020-04-05 DIAGNOSIS — C9201 Acute myeloblastic leukemia, in remission: Secondary | ICD-10-CM | POA: Diagnosis not present

## 2020-04-05 DIAGNOSIS — D801 Nonfamilial hypogammaglobulinemia: Secondary | ICD-10-CM | POA: Diagnosis not present

## 2020-04-05 DIAGNOSIS — D709 Neutropenia, unspecified: Secondary | ICD-10-CM | POA: Diagnosis not present

## 2020-04-05 DIAGNOSIS — Z7901 Long term (current) use of anticoagulants: Secondary | ICD-10-CM | POA: Diagnosis not present

## 2020-04-05 DIAGNOSIS — R0600 Dyspnea, unspecified: Secondary | ICD-10-CM | POA: Diagnosis not present

## 2020-04-05 DIAGNOSIS — I5023 Acute on chronic systolic (congestive) heart failure: Secondary | ICD-10-CM | POA: Diagnosis not present

## 2020-04-05 DIAGNOSIS — D61818 Other pancytopenia: Secondary | ICD-10-CM | POA: Diagnosis not present

## 2020-04-05 DIAGNOSIS — R531 Weakness: Secondary | ICD-10-CM | POA: Diagnosis not present

## 2020-04-05 DIAGNOSIS — Z9481 Bone marrow transplant status: Secondary | ICD-10-CM | POA: Diagnosis not present

## 2020-04-05 DIAGNOSIS — R5383 Other fatigue: Secondary | ICD-10-CM | POA: Diagnosis not present

## 2020-04-08 DIAGNOSIS — Z9481 Bone marrow transplant status: Secondary | ICD-10-CM | POA: Diagnosis not present

## 2020-04-08 DIAGNOSIS — D469 Myelodysplastic syndrome, unspecified: Secondary | ICD-10-CM | POA: Diagnosis not present

## 2020-04-08 DIAGNOSIS — Z950 Presence of cardiac pacemaker: Secondary | ICD-10-CM | POA: Diagnosis not present

## 2020-04-08 DIAGNOSIS — C3492 Malignant neoplasm of unspecified part of left bronchus or lung: Secondary | ICD-10-CM | POA: Diagnosis not present

## 2020-04-08 DIAGNOSIS — Z9889 Other specified postprocedural states: Secondary | ICD-10-CM | POA: Diagnosis not present

## 2020-04-08 DIAGNOSIS — I5023 Acute on chronic systolic (congestive) heart failure: Secondary | ICD-10-CM | POA: Diagnosis not present

## 2020-04-08 DIAGNOSIS — J984 Other disorders of lung: Secondary | ICD-10-CM | POA: Diagnosis not present

## 2020-04-08 DIAGNOSIS — Z952 Presence of prosthetic heart valve: Secondary | ICD-10-CM | POA: Diagnosis not present

## 2020-04-08 DIAGNOSIS — C9201 Acute myeloblastic leukemia, in remission: Secondary | ICD-10-CM | POA: Diagnosis not present

## 2020-04-08 DIAGNOSIS — J90 Pleural effusion, not elsewhere classified: Secondary | ICD-10-CM | POA: Diagnosis not present

## 2020-04-08 DIAGNOSIS — J9811 Atelectasis: Secondary | ICD-10-CM | POA: Diagnosis not present

## 2020-04-08 DIAGNOSIS — Z923 Personal history of irradiation: Secondary | ICD-10-CM | POA: Diagnosis not present

## 2020-04-08 DIAGNOSIS — C3412 Malignant neoplasm of upper lobe, left bronchus or lung: Secondary | ICD-10-CM | POA: Diagnosis not present

## 2020-04-13 DIAGNOSIS — D61818 Other pancytopenia: Secondary | ICD-10-CM | POA: Diagnosis not present

## 2020-04-13 DIAGNOSIS — C9200 Acute myeloblastic leukemia, not having achieved remission: Secondary | ICD-10-CM | POA: Diagnosis not present

## 2020-04-13 DIAGNOSIS — Z79899 Other long term (current) drug therapy: Secondary | ICD-10-CM | POA: Diagnosis not present

## 2020-04-13 DIAGNOSIS — C9201 Acute myeloblastic leukemia, in remission: Secondary | ICD-10-CM | POA: Diagnosis not present

## 2020-04-13 DIAGNOSIS — Z7982 Long term (current) use of aspirin: Secondary | ICD-10-CM | POA: Diagnosis not present

## 2020-04-13 DIAGNOSIS — Z9481 Bone marrow transplant status: Secondary | ICD-10-CM | POA: Diagnosis not present

## 2020-04-13 DIAGNOSIS — C3412 Malignant neoplasm of upper lobe, left bronchus or lung: Secondary | ICD-10-CM | POA: Diagnosis not present

## 2020-04-13 DIAGNOSIS — Z7901 Long term (current) use of anticoagulants: Secondary | ICD-10-CM | POA: Diagnosis not present

## 2020-04-13 DIAGNOSIS — Z9484 Stem cells transplant status: Secondary | ICD-10-CM | POA: Diagnosis not present

## 2020-04-13 DIAGNOSIS — B952 Enterococcus as the cause of diseases classified elsewhere: Secondary | ICD-10-CM | POA: Diagnosis not present

## 2020-04-16 ENCOUNTER — Other Ambulatory Visit: Payer: Self-pay

## 2020-04-16 DIAGNOSIS — C9201 Acute myeloblastic leukemia, in remission: Secondary | ICD-10-CM | POA: Diagnosis not present

## 2020-04-16 DIAGNOSIS — Z9481 Bone marrow transplant status: Secondary | ICD-10-CM | POA: Diagnosis not present

## 2020-04-16 DIAGNOSIS — D469 Myelodysplastic syndrome, unspecified: Secondary | ICD-10-CM | POA: Diagnosis not present

## 2020-04-16 MED ORDER — ALBUTEROL SULFATE HFA 108 (90 BASE) MCG/ACT IN AERS: 2.0000 | INHALATION_SPRAY | Freq: Three times a day (TID) | RESPIRATORY_TRACT | 6 refills | Status: AC | PRN

## 2020-04-20 DIAGNOSIS — D469 Myelodysplastic syndrome, unspecified: Secondary | ICD-10-CM | POA: Diagnosis not present

## 2020-04-20 DIAGNOSIS — Z9481 Bone marrow transplant status: Secondary | ICD-10-CM | POA: Diagnosis not present

## 2020-04-20 DIAGNOSIS — C9201 Acute myeloblastic leukemia, in remission: Secondary | ICD-10-CM | POA: Diagnosis not present

## 2020-04-23 DIAGNOSIS — C9201 Acute myeloblastic leukemia, in remission: Secondary | ICD-10-CM | POA: Diagnosis not present

## 2020-04-23 DIAGNOSIS — Z9481 Bone marrow transplant status: Secondary | ICD-10-CM | POA: Diagnosis not present

## 2020-04-23 DIAGNOSIS — D469 Myelodysplastic syndrome, unspecified: Secondary | ICD-10-CM | POA: Diagnosis not present

## 2020-04-25 DIAGNOSIS — G4733 Obstructive sleep apnea (adult) (pediatric): Secondary | ICD-10-CM | POA: Diagnosis not present

## 2020-04-27 DIAGNOSIS — C9201 Acute myeloblastic leukemia, in remission: Secondary | ICD-10-CM | POA: Diagnosis not present

## 2020-04-27 DIAGNOSIS — D61818 Other pancytopenia: Secondary | ICD-10-CM | POA: Diagnosis not present

## 2020-04-27 DIAGNOSIS — Z9484 Stem cells transplant status: Secondary | ICD-10-CM | POA: Diagnosis not present

## 2020-04-27 DIAGNOSIS — C3412 Malignant neoplasm of upper lobe, left bronchus or lung: Secondary | ICD-10-CM | POA: Diagnosis not present

## 2020-04-30 DIAGNOSIS — D469 Myelodysplastic syndrome, unspecified: Secondary | ICD-10-CM | POA: Diagnosis not present

## 2020-04-30 DIAGNOSIS — Z9481 Bone marrow transplant status: Secondary | ICD-10-CM | POA: Diagnosis not present

## 2020-04-30 DIAGNOSIS — C9201 Acute myeloblastic leukemia, in remission: Secondary | ICD-10-CM | POA: Diagnosis not present

## 2020-05-03 DIAGNOSIS — Z9481 Bone marrow transplant status: Secondary | ICD-10-CM | POA: Diagnosis not present

## 2020-05-03 DIAGNOSIS — C9201 Acute myeloblastic leukemia, in remission: Secondary | ICD-10-CM | POA: Diagnosis not present

## 2020-05-03 DIAGNOSIS — D469 Myelodysplastic syndrome, unspecified: Secondary | ICD-10-CM | POA: Diagnosis not present

## 2020-05-10 DIAGNOSIS — Z9481 Bone marrow transplant status: Secondary | ICD-10-CM | POA: Diagnosis not present

## 2020-05-10 DIAGNOSIS — C9201 Acute myeloblastic leukemia, in remission: Secondary | ICD-10-CM | POA: Diagnosis not present

## 2020-05-10 DIAGNOSIS — Z23 Encounter for immunization: Secondary | ICD-10-CM | POA: Diagnosis not present

## 2020-05-10 DIAGNOSIS — D469 Myelodysplastic syndrome, unspecified: Secondary | ICD-10-CM | POA: Diagnosis not present

## 2020-05-17 DIAGNOSIS — Z79899 Other long term (current) drug therapy: Secondary | ICD-10-CM | POA: Diagnosis not present

## 2020-05-17 DIAGNOSIS — Z9484 Stem cells transplant status: Secondary | ICD-10-CM | POA: Diagnosis not present

## 2020-05-17 DIAGNOSIS — C9201 Acute myeloblastic leukemia, in remission: Secondary | ICD-10-CM | POA: Diagnosis not present

## 2020-05-17 DIAGNOSIS — Z5111 Encounter for antineoplastic chemotherapy: Secondary | ICD-10-CM | POA: Diagnosis not present

## 2020-05-17 DIAGNOSIS — C3412 Malignant neoplasm of upper lobe, left bronchus or lung: Secondary | ICD-10-CM | POA: Diagnosis not present

## 2020-05-18 DIAGNOSIS — Z9484 Stem cells transplant status: Secondary | ICD-10-CM | POA: Diagnosis not present

## 2020-05-18 DIAGNOSIS — Z5111 Encounter for antineoplastic chemotherapy: Secondary | ICD-10-CM | POA: Diagnosis not present

## 2020-05-18 DIAGNOSIS — C3412 Malignant neoplasm of upper lobe, left bronchus or lung: Secondary | ICD-10-CM | POA: Diagnosis not present

## 2020-05-18 DIAGNOSIS — C9201 Acute myeloblastic leukemia, in remission: Secondary | ICD-10-CM | POA: Diagnosis not present

## 2020-05-18 DIAGNOSIS — Z79899 Other long term (current) drug therapy: Secondary | ICD-10-CM | POA: Diagnosis not present

## 2020-05-19 DIAGNOSIS — Z79899 Other long term (current) drug therapy: Secondary | ICD-10-CM | POA: Diagnosis not present

## 2020-05-19 DIAGNOSIS — C9201 Acute myeloblastic leukemia, in remission: Secondary | ICD-10-CM | POA: Diagnosis not present

## 2020-05-19 DIAGNOSIS — Z5111 Encounter for antineoplastic chemotherapy: Secondary | ICD-10-CM | POA: Diagnosis not present

## 2020-05-19 DIAGNOSIS — C3412 Malignant neoplasm of upper lobe, left bronchus or lung: Secondary | ICD-10-CM | POA: Diagnosis not present

## 2020-05-19 DIAGNOSIS — T82898A Other specified complication of vascular prosthetic devices, implants and grafts, initial encounter: Secondary | ICD-10-CM | POA: Diagnosis not present

## 2020-05-19 DIAGNOSIS — Z9484 Stem cells transplant status: Secondary | ICD-10-CM | POA: Diagnosis not present

## 2020-05-20 DIAGNOSIS — Z79899 Other long term (current) drug therapy: Secondary | ICD-10-CM | POA: Diagnosis not present

## 2020-05-20 DIAGNOSIS — Z9484 Stem cells transplant status: Secondary | ICD-10-CM | POA: Diagnosis not present

## 2020-05-20 DIAGNOSIS — C3412 Malignant neoplasm of upper lobe, left bronchus or lung: Secondary | ICD-10-CM | POA: Diagnosis not present

## 2020-05-20 DIAGNOSIS — Z5111 Encounter for antineoplastic chemotherapy: Secondary | ICD-10-CM | POA: Diagnosis not present

## 2020-05-20 DIAGNOSIS — C9201 Acute myeloblastic leukemia, in remission: Secondary | ICD-10-CM | POA: Diagnosis not present

## 2020-05-21 DIAGNOSIS — Z9484 Stem cells transplant status: Secondary | ICD-10-CM | POA: Diagnosis not present

## 2020-05-21 DIAGNOSIS — C9201 Acute myeloblastic leukemia, in remission: Secondary | ICD-10-CM | POA: Diagnosis not present

## 2020-05-21 DIAGNOSIS — Z79899 Other long term (current) drug therapy: Secondary | ICD-10-CM | POA: Diagnosis not present

## 2020-05-21 DIAGNOSIS — C3412 Malignant neoplasm of upper lobe, left bronchus or lung: Secondary | ICD-10-CM | POA: Diagnosis not present

## 2020-05-21 DIAGNOSIS — Z5111 Encounter for antineoplastic chemotherapy: Secondary | ICD-10-CM | POA: Diagnosis not present

## 2020-05-25 DIAGNOSIS — C9201 Acute myeloblastic leukemia, in remission: Secondary | ICD-10-CM | POA: Diagnosis not present

## 2020-05-25 DIAGNOSIS — D61818 Other pancytopenia: Secondary | ICD-10-CM | POA: Diagnosis not present

## 2020-05-25 DIAGNOSIS — Z9484 Stem cells transplant status: Secondary | ICD-10-CM | POA: Diagnosis not present

## 2020-05-25 DIAGNOSIS — C3412 Malignant neoplasm of upper lobe, left bronchus or lung: Secondary | ICD-10-CM | POA: Diagnosis not present

## 2020-05-27 DIAGNOSIS — Z9484 Stem cells transplant status: Secondary | ICD-10-CM | POA: Diagnosis not present

## 2020-05-27 DIAGNOSIS — C9201 Acute myeloblastic leukemia, in remission: Secondary | ICD-10-CM | POA: Diagnosis not present

## 2020-05-27 DIAGNOSIS — D61818 Other pancytopenia: Secondary | ICD-10-CM | POA: Diagnosis not present

## 2020-05-27 DIAGNOSIS — C3412 Malignant neoplasm of upper lobe, left bronchus or lung: Secondary | ICD-10-CM | POA: Diagnosis not present

## 2020-05-31 ENCOUNTER — Ambulatory Visit (INDEPENDENT_AMBULATORY_CARE_PROVIDER_SITE_OTHER): Payer: Medicare Other

## 2020-05-31 DIAGNOSIS — Z20828 Contact with and (suspected) exposure to other viral communicable diseases: Secondary | ICD-10-CM | POA: Diagnosis not present

## 2020-05-31 LAB — POC COVID19 BINAXNOW: SARS Coronavirus 2 Ag: NEGATIVE

## 2020-05-31 NOTE — Progress Notes (Signed)
Patient Name: Patrick Durham Date of Birth: 06-03-1951 MRN:  [id number]  Patrick Durham is a 69 y.o. yo male presenting for COVID-19 testing.  He is being tested from the vehicle.  Patrick Durham is being tested due to a positive COVID exposure.  He is not having any symptoms of COVID.  Results: NEGATIVE on 05/31/2020  Erie Noe, LPN 85:50 AM

## 2020-06-01 DIAGNOSIS — C9201 Acute myeloblastic leukemia, in remission: Secondary | ICD-10-CM | POA: Diagnosis not present

## 2020-06-01 DIAGNOSIS — C3412 Malignant neoplasm of upper lobe, left bronchus or lung: Secondary | ICD-10-CM | POA: Diagnosis not present

## 2020-06-01 DIAGNOSIS — D61818 Other pancytopenia: Secondary | ICD-10-CM | POA: Diagnosis not present

## 2020-06-01 DIAGNOSIS — Z9484 Stem cells transplant status: Secondary | ICD-10-CM | POA: Diagnosis not present

## 2020-06-04 DIAGNOSIS — Z9484 Stem cells transplant status: Secondary | ICD-10-CM | POA: Diagnosis not present

## 2020-06-04 DIAGNOSIS — C9201 Acute myeloblastic leukemia, in remission: Secondary | ICD-10-CM | POA: Diagnosis not present

## 2020-06-04 DIAGNOSIS — D61818 Other pancytopenia: Secondary | ICD-10-CM | POA: Diagnosis not present

## 2020-06-04 DIAGNOSIS — C3412 Malignant neoplasm of upper lobe, left bronchus or lung: Secondary | ICD-10-CM | POA: Diagnosis not present

## 2020-06-07 DIAGNOSIS — C3412 Malignant neoplasm of upper lobe, left bronchus or lung: Secondary | ICD-10-CM | POA: Diagnosis not present

## 2020-06-07 DIAGNOSIS — Z9484 Stem cells transplant status: Secondary | ICD-10-CM | POA: Diagnosis not present

## 2020-06-07 DIAGNOSIS — C9201 Acute myeloblastic leukemia, in remission: Secondary | ICD-10-CM | POA: Diagnosis not present

## 2020-06-07 DIAGNOSIS — D61818 Other pancytopenia: Secondary | ICD-10-CM | POA: Diagnosis not present

## 2020-06-08 DIAGNOSIS — Z950 Presence of cardiac pacemaker: Secondary | ICD-10-CM | POA: Diagnosis not present

## 2020-06-08 DIAGNOSIS — Z45018 Encounter for adjustment and management of other part of cardiac pacemaker: Secondary | ICD-10-CM | POA: Diagnosis not present

## 2020-06-08 DIAGNOSIS — I4821 Permanent atrial fibrillation: Secondary | ICD-10-CM | POA: Diagnosis not present

## 2020-06-08 DIAGNOSIS — I4891 Unspecified atrial fibrillation: Secondary | ICD-10-CM | POA: Diagnosis not present

## 2020-06-10 DIAGNOSIS — C3412 Malignant neoplasm of upper lobe, left bronchus or lung: Secondary | ICD-10-CM | POA: Diagnosis not present

## 2020-06-10 DIAGNOSIS — D61818 Other pancytopenia: Secondary | ICD-10-CM | POA: Diagnosis not present

## 2020-06-10 DIAGNOSIS — C9201 Acute myeloblastic leukemia, in remission: Secondary | ICD-10-CM | POA: Diagnosis not present

## 2020-06-10 DIAGNOSIS — Z9484 Stem cells transplant status: Secondary | ICD-10-CM | POA: Diagnosis not present

## 2020-06-17 DIAGNOSIS — R509 Fever, unspecified: Secondary | ICD-10-CM | POA: Diagnosis not present

## 2020-06-17 DIAGNOSIS — J9611 Chronic respiratory failure with hypoxia: Secondary | ICD-10-CM | POA: Diagnosis not present

## 2020-06-17 DIAGNOSIS — N183 Chronic kidney disease, stage 3 unspecified: Secondary | ICD-10-CM | POA: Diagnosis not present

## 2020-06-17 DIAGNOSIS — E1169 Type 2 diabetes mellitus with other specified complication: Secondary | ICD-10-CM | POA: Diagnosis present

## 2020-06-17 DIAGNOSIS — I5042 Chronic combined systolic (congestive) and diastolic (congestive) heart failure: Secondary | ICD-10-CM | POA: Diagnosis not present

## 2020-06-17 DIAGNOSIS — R52 Pain, unspecified: Secondary | ICD-10-CM | POA: Diagnosis not present

## 2020-06-17 DIAGNOSIS — R5081 Fever presenting with conditions classified elsewhere: Secondary | ICD-10-CM | POA: Diagnosis not present

## 2020-06-17 DIAGNOSIS — D61818 Other pancytopenia: Secondary | ICD-10-CM | POA: Diagnosis present

## 2020-06-17 DIAGNOSIS — I5022 Chronic systolic (congestive) heart failure: Secondary | ICD-10-CM | POA: Diagnosis not present

## 2020-06-17 DIAGNOSIS — I351 Nonrheumatic aortic (valve) insufficiency: Secondary | ICD-10-CM | POA: Diagnosis present

## 2020-06-17 DIAGNOSIS — J449 Chronic obstructive pulmonary disease, unspecified: Secondary | ICD-10-CM | POA: Diagnosis not present

## 2020-06-17 DIAGNOSIS — R6520 Severe sepsis without septic shock: Secondary | ICD-10-CM | POA: Diagnosis present

## 2020-06-17 DIAGNOSIS — E1122 Type 2 diabetes mellitus with diabetic chronic kidney disease: Secondary | ICD-10-CM | POA: Diagnosis not present

## 2020-06-17 DIAGNOSIS — A4159 Other Gram-negative sepsis: Secondary | ICD-10-CM | POA: Diagnosis present

## 2020-06-17 DIAGNOSIS — J90 Pleural effusion, not elsewhere classified: Secondary | ICD-10-CM | POA: Diagnosis not present

## 2020-06-17 DIAGNOSIS — T80211A Bloodstream infection due to central venous catheter, initial encounter: Secondary | ICD-10-CM | POA: Diagnosis not present

## 2020-06-17 DIAGNOSIS — C9201 Acute myeloblastic leukemia, in remission: Secondary | ICD-10-CM | POA: Diagnosis not present

## 2020-06-17 DIAGNOSIS — Z20822 Contact with and (suspected) exposure to covid-19: Secondary | ICD-10-CM | POA: Diagnosis not present

## 2020-06-17 DIAGNOSIS — Z806 Family history of leukemia: Secondary | ICD-10-CM | POA: Diagnosis not present

## 2020-06-17 DIAGNOSIS — I4891 Unspecified atrial fibrillation: Secondary | ICD-10-CM | POA: Diagnosis present

## 2020-06-17 DIAGNOSIS — Z9484 Stem cells transplant status: Secondary | ICD-10-CM | POA: Diagnosis not present

## 2020-06-17 DIAGNOSIS — J80 Acute respiratory distress syndrome: Secondary | ICD-10-CM | POA: Diagnosis not present

## 2020-06-17 DIAGNOSIS — R059 Cough, unspecified: Secondary | ICD-10-CM | POA: Diagnosis not present

## 2020-06-17 DIAGNOSIS — D696 Thrombocytopenia, unspecified: Secondary | ICD-10-CM | POA: Diagnosis not present

## 2020-06-17 DIAGNOSIS — Z66 Do not resuscitate: Secondary | ICD-10-CM | POA: Diagnosis present

## 2020-06-17 DIAGNOSIS — Z87891 Personal history of nicotine dependence: Secondary | ICD-10-CM | POA: Diagnosis not present

## 2020-06-17 DIAGNOSIS — R0902 Hypoxemia: Secondary | ICD-10-CM | POA: Diagnosis not present

## 2020-06-17 DIAGNOSIS — R0689 Other abnormalities of breathing: Secondary | ICD-10-CM | POA: Diagnosis not present

## 2020-06-17 DIAGNOSIS — D849 Immunodeficiency, unspecified: Secondary | ICD-10-CM | POA: Diagnosis not present

## 2020-06-17 DIAGNOSIS — A419 Sepsis, unspecified organism: Secondary | ICD-10-CM | POA: Diagnosis not present

## 2020-06-17 DIAGNOSIS — D709 Neutropenia, unspecified: Secondary | ICD-10-CM | POA: Diagnosis not present

## 2020-06-17 DIAGNOSIS — J189 Pneumonia, unspecified organism: Secondary | ICD-10-CM | POA: Diagnosis not present

## 2020-06-17 DIAGNOSIS — J440 Chronic obstructive pulmonary disease with acute lower respiratory infection: Secondary | ICD-10-CM | POA: Diagnosis present

## 2020-06-17 DIAGNOSIS — J329 Chronic sinusitis, unspecified: Secondary | ICD-10-CM | POA: Diagnosis present

## 2020-06-17 DIAGNOSIS — A414 Sepsis due to anaerobes: Secondary | ICD-10-CM | POA: Diagnosis not present

## 2020-06-17 DIAGNOSIS — Z9221 Personal history of antineoplastic chemotherapy: Secondary | ICD-10-CM | POA: Diagnosis not present

## 2020-06-17 DIAGNOSIS — D72819 Decreased white blood cell count, unspecified: Secondary | ICD-10-CM | POA: Diagnosis not present

## 2020-06-17 DIAGNOSIS — C3490 Malignant neoplasm of unspecified part of unspecified bronchus or lung: Secondary | ICD-10-CM | POA: Diagnosis not present

## 2020-06-17 DIAGNOSIS — D649 Anemia, unspecified: Secondary | ICD-10-CM | POA: Diagnosis not present

## 2020-06-17 DIAGNOSIS — I959 Hypotension, unspecified: Secondary | ICD-10-CM | POA: Diagnosis present

## 2020-06-17 DIAGNOSIS — C3491 Malignant neoplasm of unspecified part of right bronchus or lung: Secondary | ICD-10-CM | POA: Diagnosis present

## 2020-06-17 DIAGNOSIS — R0602 Shortness of breath: Secondary | ICD-10-CM | POA: Diagnosis not present

## 2020-06-17 DIAGNOSIS — Z950 Presence of cardiac pacemaker: Secondary | ICD-10-CM | POA: Diagnosis not present

## 2020-06-18 DIAGNOSIS — J9611 Chronic respiratory failure with hypoxia: Secondary | ICD-10-CM | POA: Diagnosis present

## 2020-06-18 DIAGNOSIS — J189 Pneumonia, unspecified organism: Secondary | ICD-10-CM | POA: Diagnosis not present

## 2020-06-18 DIAGNOSIS — K7460 Unspecified cirrhosis of liver: Secondary | ICD-10-CM | POA: Diagnosis not present

## 2020-06-18 DIAGNOSIS — E872 Acidosis: Secondary | ICD-10-CM | POA: Diagnosis present

## 2020-06-18 DIAGNOSIS — R5081 Fever presenting with conditions classified elsewhere: Secondary | ICD-10-CM | POA: Diagnosis not present

## 2020-06-18 DIAGNOSIS — Z972 Presence of dental prosthetic device (complete) (partial): Secondary | ICD-10-CM | POA: Diagnosis not present

## 2020-06-18 DIAGNOSIS — C9201 Acute myeloblastic leukemia, in remission: Secondary | ICD-10-CM | POA: Diagnosis not present

## 2020-06-18 DIAGNOSIS — D849 Immunodeficiency, unspecified: Secondary | ICD-10-CM | POA: Diagnosis not present

## 2020-06-18 DIAGNOSIS — D703 Neutropenia due to infection: Secondary | ICD-10-CM | POA: Diagnosis not present

## 2020-06-18 DIAGNOSIS — Z885 Allergy status to narcotic agent status: Secondary | ICD-10-CM | POA: Diagnosis not present

## 2020-06-18 DIAGNOSIS — R17 Unspecified jaundice: Secondary | ICD-10-CM | POA: Diagnosis not present

## 2020-06-18 DIAGNOSIS — J156 Pneumonia due to other aerobic Gram-negative bacteria: Secondary | ICD-10-CM | POA: Diagnosis present

## 2020-06-18 DIAGNOSIS — T451X5A Adverse effect of antineoplastic and immunosuppressive drugs, initial encounter: Secondary | ICD-10-CM | POA: Diagnosis not present

## 2020-06-18 DIAGNOSIS — I5189 Other ill-defined heart diseases: Secondary | ICD-10-CM | POA: Diagnosis not present

## 2020-06-18 DIAGNOSIS — R0602 Shortness of breath: Secondary | ICD-10-CM | POA: Diagnosis not present

## 2020-06-18 DIAGNOSIS — Z85118 Personal history of other malignant neoplasm of bronchus and lung: Secondary | ICD-10-CM | POA: Diagnosis not present

## 2020-06-18 DIAGNOSIS — I2699 Other pulmonary embolism without acute cor pulmonale: Secondary | ICD-10-CM | POA: Diagnosis present

## 2020-06-18 DIAGNOSIS — Z86711 Personal history of pulmonary embolism: Secondary | ICD-10-CM | POA: Diagnosis not present

## 2020-06-18 DIAGNOSIS — Z9481 Bone marrow transplant status: Secondary | ICD-10-CM | POA: Diagnosis not present

## 2020-06-18 DIAGNOSIS — J984 Other disorders of lung: Secondary | ICD-10-CM | POA: Diagnosis not present

## 2020-06-18 DIAGNOSIS — I517 Cardiomegaly: Secondary | ICD-10-CM | POA: Diagnosis not present

## 2020-06-18 DIAGNOSIS — J90 Pleural effusion, not elsewhere classified: Secondary | ICD-10-CM | POA: Diagnosis not present

## 2020-06-18 DIAGNOSIS — Z66 Do not resuscitate: Secondary | ICD-10-CM | POA: Diagnosis present

## 2020-06-18 DIAGNOSIS — I4581 Long QT syndrome: Secondary | ICD-10-CM | POA: Diagnosis present

## 2020-06-18 DIAGNOSIS — J440 Chronic obstructive pulmonary disease with acute lower respiratory infection: Secondary | ICD-10-CM | POA: Diagnosis present

## 2020-06-18 DIAGNOSIS — I4891 Unspecified atrial fibrillation: Secondary | ICD-10-CM | POA: Diagnosis present

## 2020-06-18 DIAGNOSIS — Z20822 Contact with and (suspected) exposure to covid-19: Secondary | ICD-10-CM | POA: Diagnosis present

## 2020-06-18 DIAGNOSIS — N183 Chronic kidney disease, stage 3 unspecified: Secondary | ICD-10-CM | POA: Diagnosis not present

## 2020-06-18 DIAGNOSIS — D696 Thrombocytopenia, unspecified: Secondary | ICD-10-CM | POA: Diagnosis present

## 2020-06-18 DIAGNOSIS — D61818 Other pancytopenia: Secondary | ICD-10-CM | POA: Diagnosis not present

## 2020-06-18 DIAGNOSIS — J17 Pneumonia in diseases classified elsewhere: Secondary | ICD-10-CM | POA: Diagnosis not present

## 2020-06-18 DIAGNOSIS — I5043 Acute on chronic combined systolic (congestive) and diastolic (congestive) heart failure: Secondary | ICD-10-CM | POA: Diagnosis present

## 2020-06-18 DIAGNOSIS — D61810 Antineoplastic chemotherapy induced pancytopenia: Secondary | ICD-10-CM | POA: Diagnosis not present

## 2020-06-18 DIAGNOSIS — D709 Neutropenia, unspecified: Secondary | ICD-10-CM | POA: Diagnosis not present

## 2020-06-18 DIAGNOSIS — I340 Nonrheumatic mitral (valve) insufficiency: Secondary | ICD-10-CM | POA: Diagnosis not present

## 2020-06-18 DIAGNOSIS — R7881 Bacteremia: Secondary | ICD-10-CM | POA: Diagnosis not present

## 2020-06-18 DIAGNOSIS — C9202 Acute myeloblastic leukemia, in relapse: Secondary | ICD-10-CM | POA: Diagnosis not present

## 2020-06-18 DIAGNOSIS — R918 Other nonspecific abnormal finding of lung field: Secondary | ICD-10-CM | POA: Diagnosis not present

## 2020-06-18 DIAGNOSIS — B952 Enterococcus as the cause of diseases classified elsewhere: Secondary | ICD-10-CM | POA: Diagnosis not present

## 2020-06-18 DIAGNOSIS — D89813 Graft-versus-host disease, unspecified: Secondary | ICD-10-CM | POA: Diagnosis present

## 2020-06-18 DIAGNOSIS — I5023 Acute on chronic systolic (congestive) heart failure: Secondary | ICD-10-CM | POA: Diagnosis not present

## 2020-06-18 DIAGNOSIS — R590 Localized enlarged lymph nodes: Secondary | ICD-10-CM | POA: Diagnosis not present

## 2020-06-18 DIAGNOSIS — Z950 Presence of cardiac pacemaker: Secondary | ICD-10-CM | POA: Diagnosis not present

## 2020-06-18 DIAGNOSIS — Z9484 Stem cells transplant status: Secondary | ICD-10-CM | POA: Diagnosis not present

## 2020-06-18 DIAGNOSIS — B9689 Other specified bacterial agents as the cause of diseases classified elsewhere: Secondary | ICD-10-CM | POA: Diagnosis not present

## 2020-06-19 DIAGNOSIS — Z9484 Stem cells transplant status: Secondary | ICD-10-CM | POA: Diagnosis not present

## 2020-06-19 DIAGNOSIS — R7881 Bacteremia: Secondary | ICD-10-CM | POA: Diagnosis not present

## 2020-06-19 DIAGNOSIS — D709 Neutropenia, unspecified: Secondary | ICD-10-CM | POA: Diagnosis not present

## 2020-06-19 DIAGNOSIS — R5081 Fever presenting with conditions classified elsewhere: Secondary | ICD-10-CM | POA: Diagnosis not present

## 2020-06-19 DIAGNOSIS — C9202 Acute myeloblastic leukemia, in relapse: Secondary | ICD-10-CM | POA: Diagnosis not present

## 2020-06-20 DIAGNOSIS — C9202 Acute myeloblastic leukemia, in relapse: Secondary | ICD-10-CM | POA: Diagnosis not present

## 2020-06-20 DIAGNOSIS — D61810 Antineoplastic chemotherapy induced pancytopenia: Secondary | ICD-10-CM | POA: Diagnosis not present

## 2020-06-20 DIAGNOSIS — R5081 Fever presenting with conditions classified elsewhere: Secondary | ICD-10-CM | POA: Diagnosis not present

## 2020-06-20 DIAGNOSIS — R918 Other nonspecific abnormal finding of lung field: Secondary | ICD-10-CM | POA: Diagnosis not present

## 2020-06-20 DIAGNOSIS — B9689 Other specified bacterial agents as the cause of diseases classified elsewhere: Secondary | ICD-10-CM | POA: Diagnosis not present

## 2020-06-20 DIAGNOSIS — B952 Enterococcus as the cause of diseases classified elsewhere: Secondary | ICD-10-CM | POA: Diagnosis not present

## 2020-06-20 DIAGNOSIS — R7881 Bacteremia: Secondary | ICD-10-CM | POA: Diagnosis not present

## 2020-06-20 DIAGNOSIS — Z9484 Stem cells transplant status: Secondary | ICD-10-CM | POA: Diagnosis not present

## 2020-06-20 DIAGNOSIS — D703 Neutropenia due to infection: Secondary | ICD-10-CM | POA: Diagnosis not present

## 2020-06-20 DIAGNOSIS — N183 Chronic kidney disease, stage 3 unspecified: Secondary | ICD-10-CM | POA: Diagnosis not present

## 2020-06-20 DIAGNOSIS — R0602 Shortness of breath: Secondary | ICD-10-CM | POA: Diagnosis not present

## 2020-06-20 DIAGNOSIS — D709 Neutropenia, unspecified: Secondary | ICD-10-CM | POA: Diagnosis not present

## 2020-06-20 DIAGNOSIS — J90 Pleural effusion, not elsewhere classified: Secondary | ICD-10-CM | POA: Diagnosis not present

## 2020-06-21 DIAGNOSIS — B9689 Other specified bacterial agents as the cause of diseases classified elsewhere: Secondary | ICD-10-CM | POA: Diagnosis not present

## 2020-06-21 DIAGNOSIS — T451X5A Adverse effect of antineoplastic and immunosuppressive drugs, initial encounter: Secondary | ICD-10-CM | POA: Diagnosis not present

## 2020-06-21 DIAGNOSIS — K7460 Unspecified cirrhosis of liver: Secondary | ICD-10-CM | POA: Diagnosis not present

## 2020-06-21 DIAGNOSIS — Z86711 Personal history of pulmonary embolism: Secondary | ICD-10-CM | POA: Diagnosis not present

## 2020-06-21 DIAGNOSIS — J90 Pleural effusion, not elsewhere classified: Secondary | ICD-10-CM | POA: Diagnosis not present

## 2020-06-21 DIAGNOSIS — R590 Localized enlarged lymph nodes: Secondary | ICD-10-CM | POA: Diagnosis not present

## 2020-06-21 DIAGNOSIS — R918 Other nonspecific abnormal finding of lung field: Secondary | ICD-10-CM | POA: Diagnosis not present

## 2020-06-21 DIAGNOSIS — R7881 Bacteremia: Secondary | ICD-10-CM | POA: Diagnosis not present

## 2020-06-21 DIAGNOSIS — C9201 Acute myeloblastic leukemia, in remission: Secondary | ICD-10-CM | POA: Diagnosis not present

## 2020-06-21 DIAGNOSIS — J189 Pneumonia, unspecified organism: Secondary | ICD-10-CM | POA: Diagnosis not present

## 2020-06-21 DIAGNOSIS — I340 Nonrheumatic mitral (valve) insufficiency: Secondary | ICD-10-CM | POA: Diagnosis not present

## 2020-06-21 DIAGNOSIS — D709 Neutropenia, unspecified: Secondary | ICD-10-CM | POA: Diagnosis not present

## 2020-06-21 DIAGNOSIS — Z972 Presence of dental prosthetic device (complete) (partial): Secondary | ICD-10-CM | POA: Diagnosis not present

## 2020-06-21 DIAGNOSIS — Z9481 Bone marrow transplant status: Secondary | ICD-10-CM | POA: Diagnosis not present

## 2020-06-21 DIAGNOSIS — I5189 Other ill-defined heart diseases: Secondary | ICD-10-CM | POA: Diagnosis not present

## 2020-06-21 DIAGNOSIS — C9202 Acute myeloblastic leukemia, in relapse: Secondary | ICD-10-CM | POA: Diagnosis not present

## 2020-06-21 DIAGNOSIS — R5081 Fever presenting with conditions classified elsewhere: Secondary | ICD-10-CM | POA: Diagnosis not present

## 2020-06-21 DIAGNOSIS — I517 Cardiomegaly: Secondary | ICD-10-CM | POA: Diagnosis not present

## 2020-06-21 DIAGNOSIS — D61810 Antineoplastic chemotherapy induced pancytopenia: Secondary | ICD-10-CM | POA: Diagnosis not present

## 2020-06-21 DIAGNOSIS — D61818 Other pancytopenia: Secondary | ICD-10-CM | POA: Diagnosis not present

## 2020-06-22 DIAGNOSIS — Z950 Presence of cardiac pacemaker: Secondary | ICD-10-CM | POA: Diagnosis not present

## 2020-06-22 DIAGNOSIS — J17 Pneumonia in diseases classified elsewhere: Secondary | ICD-10-CM | POA: Diagnosis not present

## 2020-06-22 DIAGNOSIS — J90 Pleural effusion, not elsewhere classified: Secondary | ICD-10-CM | POA: Diagnosis not present

## 2020-06-22 DIAGNOSIS — D709 Neutropenia, unspecified: Secondary | ICD-10-CM | POA: Diagnosis not present

## 2020-06-22 DIAGNOSIS — B9689 Other specified bacterial agents as the cause of diseases classified elsewhere: Secondary | ICD-10-CM | POA: Diagnosis not present

## 2020-06-22 DIAGNOSIS — R918 Other nonspecific abnormal finding of lung field: Secondary | ICD-10-CM | POA: Diagnosis not present

## 2020-06-22 DIAGNOSIS — C9202 Acute myeloblastic leukemia, in relapse: Secondary | ICD-10-CM | POA: Diagnosis not present

## 2020-06-22 DIAGNOSIS — R7881 Bacteremia: Secondary | ICD-10-CM | POA: Diagnosis not present

## 2020-06-22 DIAGNOSIS — D61818 Other pancytopenia: Secondary | ICD-10-CM | POA: Diagnosis not present

## 2020-06-22 DIAGNOSIS — J189 Pneumonia, unspecified organism: Secondary | ICD-10-CM | POA: Diagnosis not present

## 2020-06-22 DIAGNOSIS — D849 Immunodeficiency, unspecified: Secondary | ICD-10-CM | POA: Diagnosis not present

## 2020-06-22 DIAGNOSIS — R5081 Fever presenting with conditions classified elsewhere: Secondary | ICD-10-CM | POA: Diagnosis not present

## 2020-06-23 DIAGNOSIS — D61810 Antineoplastic chemotherapy induced pancytopenia: Secondary | ICD-10-CM | POA: Diagnosis not present

## 2020-06-23 DIAGNOSIS — D61818 Other pancytopenia: Secondary | ICD-10-CM | POA: Diagnosis not present

## 2020-06-23 DIAGNOSIS — J189 Pneumonia, unspecified organism: Secondary | ICD-10-CM | POA: Diagnosis not present

## 2020-06-23 DIAGNOSIS — Z950 Presence of cardiac pacemaker: Secondary | ICD-10-CM | POA: Diagnosis not present

## 2020-06-23 DIAGNOSIS — J90 Pleural effusion, not elsewhere classified: Secondary | ICD-10-CM | POA: Diagnosis not present

## 2020-06-23 DIAGNOSIS — T451X5A Adverse effect of antineoplastic and immunosuppressive drugs, initial encounter: Secondary | ICD-10-CM | POA: Diagnosis not present

## 2020-06-23 DIAGNOSIS — R5081 Fever presenting with conditions classified elsewhere: Secondary | ICD-10-CM | POA: Diagnosis not present

## 2020-06-23 DIAGNOSIS — R7881 Bacteremia: Secondary | ICD-10-CM | POA: Diagnosis not present

## 2020-06-23 DIAGNOSIS — C9202 Acute myeloblastic leukemia, in relapse: Secondary | ICD-10-CM | POA: Diagnosis not present

## 2020-06-23 DIAGNOSIS — J17 Pneumonia in diseases classified elsewhere: Secondary | ICD-10-CM | POA: Diagnosis not present

## 2020-06-23 DIAGNOSIS — Z9481 Bone marrow transplant status: Secondary | ICD-10-CM | POA: Diagnosis not present

## 2020-06-23 DIAGNOSIS — C9201 Acute myeloblastic leukemia, in remission: Secondary | ICD-10-CM | POA: Diagnosis not present

## 2020-06-23 DIAGNOSIS — I5023 Acute on chronic systolic (congestive) heart failure: Secondary | ICD-10-CM | POA: Diagnosis not present

## 2020-06-23 DIAGNOSIS — B9689 Other specified bacterial agents as the cause of diseases classified elsewhere: Secondary | ICD-10-CM | POA: Diagnosis not present

## 2020-06-23 DIAGNOSIS — D709 Neutropenia, unspecified: Secondary | ICD-10-CM | POA: Diagnosis not present

## 2020-06-24 DIAGNOSIS — Z950 Presence of cardiac pacemaker: Secondary | ICD-10-CM | POA: Diagnosis not present

## 2020-06-24 DIAGNOSIS — J17 Pneumonia in diseases classified elsewhere: Secondary | ICD-10-CM | POA: Diagnosis not present

## 2020-06-24 DIAGNOSIS — C9202 Acute myeloblastic leukemia, in relapse: Secondary | ICD-10-CM | POA: Diagnosis not present

## 2020-06-24 DIAGNOSIS — B9689 Other specified bacterial agents as the cause of diseases classified elsewhere: Secondary | ICD-10-CM | POA: Diagnosis not present

## 2020-06-24 DIAGNOSIS — D709 Neutropenia, unspecified: Secondary | ICD-10-CM | POA: Diagnosis not present

## 2020-06-24 DIAGNOSIS — J189 Pneumonia, unspecified organism: Secondary | ICD-10-CM | POA: Diagnosis not present

## 2020-06-24 DIAGNOSIS — R7881 Bacteremia: Secondary | ICD-10-CM | POA: Diagnosis not present

## 2020-06-24 DIAGNOSIS — J90 Pleural effusion, not elsewhere classified: Secondary | ICD-10-CM | POA: Diagnosis not present

## 2020-06-24 DIAGNOSIS — D61818 Other pancytopenia: Secondary | ICD-10-CM | POA: Diagnosis not present

## 2020-06-24 DIAGNOSIS — R5081 Fever presenting with conditions classified elsewhere: Secondary | ICD-10-CM | POA: Diagnosis not present

## 2020-06-25 DIAGNOSIS — Z950 Presence of cardiac pacemaker: Secondary | ICD-10-CM | POA: Diagnosis not present

## 2020-06-28 DIAGNOSIS — I97190 Other postprocedural cardiac functional disturbances following cardiac surgery: Secondary | ICD-10-CM | POA: Diagnosis not present

## 2020-06-28 DIAGNOSIS — Z79899 Other long term (current) drug therapy: Secondary | ICD-10-CM | POA: Diagnosis not present

## 2020-06-28 DIAGNOSIS — C3492 Malignant neoplasm of unspecified part of left bronchus or lung: Secondary | ICD-10-CM | POA: Diagnosis not present

## 2020-06-28 DIAGNOSIS — Z952 Presence of prosthetic heart valve: Secondary | ICD-10-CM | POA: Diagnosis not present

## 2020-06-28 DIAGNOSIS — J17 Pneumonia in diseases classified elsewhere: Secondary | ICD-10-CM | POA: Diagnosis not present

## 2020-06-28 DIAGNOSIS — C9200 Acute myeloblastic leukemia, not having achieved remission: Secondary | ICD-10-CM | POA: Diagnosis not present

## 2020-06-28 DIAGNOSIS — I4821 Permanent atrial fibrillation: Secondary | ICD-10-CM | POA: Diagnosis not present

## 2020-06-28 DIAGNOSIS — I5023 Acute on chronic systolic (congestive) heart failure: Secondary | ICD-10-CM | POA: Diagnosis not present

## 2020-06-28 DIAGNOSIS — Z806 Family history of leukemia: Secondary | ICD-10-CM | POA: Diagnosis not present

## 2020-06-28 DIAGNOSIS — I442 Atrioventricular block, complete: Secondary | ICD-10-CM | POA: Diagnosis not present

## 2020-06-28 DIAGNOSIS — D469 Myelodysplastic syndrome, unspecified: Secondary | ICD-10-CM | POA: Diagnosis not present

## 2020-06-28 DIAGNOSIS — Z9481 Bone marrow transplant status: Secondary | ICD-10-CM | POA: Diagnosis not present

## 2020-06-28 DIAGNOSIS — B49 Unspecified mycosis: Secondary | ICD-10-CM | POA: Diagnosis not present

## 2020-06-28 DIAGNOSIS — A498 Other bacterial infections of unspecified site: Secondary | ICD-10-CM | POA: Diagnosis not present

## 2020-06-28 DIAGNOSIS — C9201 Acute myeloblastic leukemia, in remission: Secondary | ICD-10-CM | POA: Diagnosis not present

## 2020-07-01 DIAGNOSIS — T82898A Other specified complication of vascular prosthetic devices, implants and grafts, initial encounter: Secondary | ICD-10-CM | POA: Diagnosis not present

## 2020-07-01 DIAGNOSIS — C3412 Malignant neoplasm of upper lobe, left bronchus or lung: Secondary | ICD-10-CM | POA: Diagnosis not present

## 2020-07-01 DIAGNOSIS — C9201 Acute myeloblastic leukemia, in remission: Secondary | ICD-10-CM | POA: Diagnosis not present

## 2020-07-01 DIAGNOSIS — Z9484 Stem cells transplant status: Secondary | ICD-10-CM | POA: Diagnosis not present

## 2020-07-01 DIAGNOSIS — D61818 Other pancytopenia: Secondary | ICD-10-CM | POA: Diagnosis not present

## 2020-07-05 DIAGNOSIS — D469 Myelodysplastic syndrome, unspecified: Secondary | ICD-10-CM | POA: Diagnosis not present

## 2020-07-05 DIAGNOSIS — Z9481 Bone marrow transplant status: Secondary | ICD-10-CM | POA: Diagnosis not present

## 2020-07-05 DIAGNOSIS — C9201 Acute myeloblastic leukemia, in remission: Secondary | ICD-10-CM | POA: Diagnosis not present

## 2020-07-07 DIAGNOSIS — Z9481 Bone marrow transplant status: Secondary | ICD-10-CM | POA: Diagnosis not present

## 2020-07-07 DIAGNOSIS — C9201 Acute myeloblastic leukemia, in remission: Secondary | ICD-10-CM | POA: Diagnosis not present

## 2020-07-07 DIAGNOSIS — D469 Myelodysplastic syndrome, unspecified: Secondary | ICD-10-CM | POA: Diagnosis not present

## 2020-07-12 DIAGNOSIS — Z79899 Other long term (current) drug therapy: Secondary | ICD-10-CM | POA: Diagnosis not present

## 2020-07-12 DIAGNOSIS — C9200 Acute myeloblastic leukemia, not having achieved remission: Secondary | ICD-10-CM | POA: Diagnosis not present

## 2020-07-12 DIAGNOSIS — R9431 Abnormal electrocardiogram [ECG] [EKG]: Secondary | ICD-10-CM | POA: Diagnosis not present

## 2020-07-12 DIAGNOSIS — I493 Ventricular premature depolarization: Secondary | ICD-10-CM | POA: Diagnosis not present

## 2020-07-12 DIAGNOSIS — K635 Polyp of colon: Secondary | ICD-10-CM | POA: Diagnosis not present

## 2020-07-12 DIAGNOSIS — C9201 Acute myeloblastic leukemia, in remission: Secondary | ICD-10-CM | POA: Diagnosis not present

## 2020-07-12 DIAGNOSIS — Z9484 Stem cells transplant status: Secondary | ICD-10-CM | POA: Diagnosis not present

## 2020-07-12 DIAGNOSIS — I447 Left bundle-branch block, unspecified: Secondary | ICD-10-CM | POA: Diagnosis not present

## 2020-07-12 DIAGNOSIS — D696 Thrombocytopenia, unspecified: Secondary | ICD-10-CM | POA: Diagnosis not present

## 2020-07-12 DIAGNOSIS — C3412 Malignant neoplasm of upper lobe, left bronchus or lung: Secondary | ICD-10-CM | POA: Diagnosis not present

## 2020-07-12 DIAGNOSIS — D709 Neutropenia, unspecified: Secondary | ICD-10-CM | POA: Diagnosis not present

## 2020-07-12 DIAGNOSIS — R0602 Shortness of breath: Secondary | ICD-10-CM | POA: Diagnosis not present

## 2020-07-12 DIAGNOSIS — I482 Chronic atrial fibrillation, unspecified: Secondary | ICD-10-CM | POA: Diagnosis not present

## 2020-07-12 DIAGNOSIS — R059 Cough, unspecified: Secondary | ICD-10-CM | POA: Diagnosis not present

## 2020-07-12 DIAGNOSIS — R42 Dizziness and giddiness: Secondary | ICD-10-CM | POA: Diagnosis not present

## 2020-07-13 DIAGNOSIS — I4821 Permanent atrial fibrillation: Secondary | ICD-10-CM | POA: Diagnosis not present

## 2020-07-13 DIAGNOSIS — I272 Pulmonary hypertension, unspecified: Secondary | ICD-10-CM | POA: Diagnosis not present

## 2020-07-13 DIAGNOSIS — I2510 Atherosclerotic heart disease of native coronary artery without angina pectoris: Secondary | ICD-10-CM | POA: Diagnosis not present

## 2020-07-13 DIAGNOSIS — C9200 Acute myeloblastic leukemia, not having achieved remission: Secondary | ICD-10-CM | POA: Diagnosis not present

## 2020-07-13 DIAGNOSIS — J189 Pneumonia, unspecified organism: Secondary | ICD-10-CM | POA: Diagnosis not present

## 2020-07-13 DIAGNOSIS — Z79899 Other long term (current) drug therapy: Secondary | ICD-10-CM | POA: Diagnosis not present

## 2020-07-13 DIAGNOSIS — R0602 Shortness of breath: Secondary | ICD-10-CM | POA: Diagnosis not present

## 2020-07-13 DIAGNOSIS — I429 Cardiomyopathy, unspecified: Secondary | ICD-10-CM | POA: Diagnosis not present

## 2020-07-13 DIAGNOSIS — I442 Atrioventricular block, complete: Secondary | ICD-10-CM | POA: Diagnosis not present

## 2020-07-13 DIAGNOSIS — I351 Nonrheumatic aortic (valve) insufficiency: Secondary | ICD-10-CM | POA: Diagnosis not present

## 2020-07-13 DIAGNOSIS — I97190 Other postprocedural cardiac functional disturbances following cardiac surgery: Secondary | ICD-10-CM | POA: Diagnosis not present

## 2020-07-13 DIAGNOSIS — D61818 Other pancytopenia: Secondary | ICD-10-CM | POA: Diagnosis not present

## 2020-07-13 DIAGNOSIS — Z952 Presence of prosthetic heart valve: Secondary | ICD-10-CM | POA: Diagnosis not present

## 2020-07-15 DIAGNOSIS — Z9481 Bone marrow transplant status: Secondary | ICD-10-CM | POA: Diagnosis not present

## 2020-07-15 DIAGNOSIS — C9201 Acute myeloblastic leukemia, in remission: Secondary | ICD-10-CM | POA: Diagnosis not present

## 2020-07-15 DIAGNOSIS — I5023 Acute on chronic systolic (congestive) heart failure: Secondary | ICD-10-CM | POA: Diagnosis not present

## 2020-07-15 DIAGNOSIS — C3412 Malignant neoplasm of upper lobe, left bronchus or lung: Secondary | ICD-10-CM | POA: Diagnosis not present

## 2020-07-15 DIAGNOSIS — D469 Myelodysplastic syndrome, unspecified: Secondary | ICD-10-CM | POA: Diagnosis not present

## 2020-07-19 DIAGNOSIS — C9201 Acute myeloblastic leukemia, in remission: Secondary | ICD-10-CM | POA: Diagnosis not present

## 2020-07-19 DIAGNOSIS — C3412 Malignant neoplasm of upper lobe, left bronchus or lung: Secondary | ICD-10-CM | POA: Diagnosis not present

## 2020-07-19 DIAGNOSIS — Z9484 Stem cells transplant status: Secondary | ICD-10-CM | POA: Diagnosis not present

## 2020-07-19 DIAGNOSIS — D696 Thrombocytopenia, unspecified: Secondary | ICD-10-CM | POA: Diagnosis not present

## 2020-07-19 DIAGNOSIS — D709 Neutropenia, unspecified: Secondary | ICD-10-CM | POA: Diagnosis not present

## 2020-07-23 DIAGNOSIS — Z9484 Stem cells transplant status: Secondary | ICD-10-CM | POA: Diagnosis not present

## 2020-07-23 DIAGNOSIS — D709 Neutropenia, unspecified: Secondary | ICD-10-CM | POA: Diagnosis not present

## 2020-07-23 DIAGNOSIS — D696 Thrombocytopenia, unspecified: Secondary | ICD-10-CM | POA: Diagnosis not present

## 2020-07-23 DIAGNOSIS — C9201 Acute myeloblastic leukemia, in remission: Secondary | ICD-10-CM | POA: Diagnosis not present

## 2020-07-23 DIAGNOSIS — C3412 Malignant neoplasm of upper lobe, left bronchus or lung: Secondary | ICD-10-CM | POA: Diagnosis not present

## 2020-07-27 DIAGNOSIS — C9202 Acute myeloblastic leukemia, in relapse: Secondary | ICD-10-CM | POA: Diagnosis not present

## 2020-07-27 DIAGNOSIS — Z9481 Bone marrow transplant status: Secondary | ICD-10-CM | POA: Diagnosis not present

## 2020-07-27 DIAGNOSIS — C9200 Acute myeloblastic leukemia, not having achieved remission: Secondary | ICD-10-CM | POA: Diagnosis not present

## 2020-07-27 DIAGNOSIS — J189 Pneumonia, unspecified organism: Secondary | ICD-10-CM | POA: Diagnosis not present

## 2020-07-29 DIAGNOSIS — G4733 Obstructive sleep apnea (adult) (pediatric): Secondary | ICD-10-CM | POA: Diagnosis not present

## 2020-07-29 DIAGNOSIS — J411 Mucopurulent chronic bronchitis: Secondary | ICD-10-CM | POA: Diagnosis not present

## 2020-07-29 DIAGNOSIS — I4891 Unspecified atrial fibrillation: Secondary | ICD-10-CM | POA: Diagnosis not present

## 2020-07-29 DIAGNOSIS — I110 Hypertensive heart disease with heart failure: Secondary | ICD-10-CM | POA: Diagnosis not present

## 2020-07-30 DIAGNOSIS — N183 Chronic kidney disease, stage 3 unspecified: Secondary | ICD-10-CM | POA: Diagnosis present

## 2020-07-30 DIAGNOSIS — Z66 Do not resuscitate: Secondary | ICD-10-CM | POA: Diagnosis present

## 2020-07-30 DIAGNOSIS — H903 Sensorineural hearing loss, bilateral: Secondary | ICD-10-CM | POA: Diagnosis present

## 2020-07-30 DIAGNOSIS — R5381 Other malaise: Secondary | ICD-10-CM | POA: Diagnosis present

## 2020-07-30 DIAGNOSIS — R918 Other nonspecific abnormal finding of lung field: Secondary | ICD-10-CM | POA: Diagnosis not present

## 2020-07-30 DIAGNOSIS — I447 Left bundle-branch block, unspecified: Secondary | ICD-10-CM | POA: Diagnosis not present

## 2020-07-30 DIAGNOSIS — Z950 Presence of cardiac pacemaker: Secondary | ICD-10-CM | POA: Diagnosis not present

## 2020-07-30 DIAGNOSIS — J90 Pleural effusion, not elsewhere classified: Secondary | ICD-10-CM | POA: Diagnosis not present

## 2020-07-30 DIAGNOSIS — Z86711 Personal history of pulmonary embolism: Secondary | ICD-10-CM | POA: Diagnosis not present

## 2020-07-30 DIAGNOSIS — R9431 Abnormal electrocardiogram [ECG] [EKG]: Secondary | ICD-10-CM | POA: Diagnosis present

## 2020-07-30 DIAGNOSIS — Z20822 Contact with and (suspected) exposure to covid-19: Secondary | ICD-10-CM | POA: Diagnosis present

## 2020-07-30 DIAGNOSIS — T80212A Local infection due to central venous catheter, initial encounter: Secondary | ICD-10-CM | POA: Diagnosis not present

## 2020-07-30 DIAGNOSIS — I2510 Atherosclerotic heart disease of native coronary artery without angina pectoris: Secondary | ICD-10-CM | POA: Diagnosis present

## 2020-07-30 DIAGNOSIS — G4733 Obstructive sleep apnea (adult) (pediatric): Secondary | ICD-10-CM | POA: Diagnosis present

## 2020-07-30 DIAGNOSIS — I429 Cardiomyopathy, unspecified: Secondary | ICD-10-CM | POA: Diagnosis present

## 2020-07-30 DIAGNOSIS — Z452 Encounter for adjustment and management of vascular access device: Secondary | ICD-10-CM | POA: Diagnosis not present

## 2020-07-30 DIAGNOSIS — G603 Idiopathic progressive neuropathy: Secondary | ICD-10-CM | POA: Diagnosis present

## 2020-07-30 DIAGNOSIS — C9200 Acute myeloblastic leukemia, not having achieved remission: Secondary | ICD-10-CM | POA: Diagnosis not present

## 2020-07-30 DIAGNOSIS — T451X5A Adverse effect of antineoplastic and immunosuppressive drugs, initial encounter: Secondary | ICD-10-CM | POA: Diagnosis present

## 2020-07-30 DIAGNOSIS — H2513 Age-related nuclear cataract, bilateral: Secondary | ICD-10-CM | POA: Diagnosis present

## 2020-07-30 DIAGNOSIS — D61810 Antineoplastic chemotherapy induced pancytopenia: Secondary | ICD-10-CM | POA: Diagnosis present

## 2020-07-30 DIAGNOSIS — J440 Chronic obstructive pulmonary disease with acute lower respiratory infection: Secondary | ICD-10-CM | POA: Diagnosis present

## 2020-07-30 DIAGNOSIS — D709 Neutropenia, unspecified: Secondary | ICD-10-CM | POA: Diagnosis not present

## 2020-07-30 DIAGNOSIS — R7303 Prediabetes: Secondary | ICD-10-CM | POA: Diagnosis present

## 2020-07-30 DIAGNOSIS — I4821 Permanent atrial fibrillation: Secondary | ICD-10-CM | POA: Diagnosis present

## 2020-07-30 DIAGNOSIS — Z85118 Personal history of other malignant neoplasm of bronchus and lung: Secondary | ICD-10-CM | POA: Diagnosis not present

## 2020-07-30 DIAGNOSIS — J189 Pneumonia, unspecified organism: Secondary | ICD-10-CM | POA: Diagnosis not present

## 2020-07-30 DIAGNOSIS — D703 Neutropenia due to infection: Secondary | ICD-10-CM | POA: Diagnosis present

## 2020-07-30 DIAGNOSIS — I130 Hypertensive heart and chronic kidney disease with heart failure and stage 1 through stage 4 chronic kidney disease, or unspecified chronic kidney disease: Secondary | ICD-10-CM | POA: Diagnosis present

## 2020-07-30 DIAGNOSIS — R5081 Fever presenting with conditions classified elsewhere: Secondary | ICD-10-CM | POA: Diagnosis not present

## 2020-07-30 DIAGNOSIS — C9201 Acute myeloblastic leukemia, in remission: Secondary | ICD-10-CM | POA: Diagnosis present

## 2020-07-30 DIAGNOSIS — I5022 Chronic systolic (congestive) heart failure: Secondary | ICD-10-CM | POA: Diagnosis present

## 2020-08-05 DIAGNOSIS — C3412 Malignant neoplasm of upper lobe, left bronchus or lung: Secondary | ICD-10-CM | POA: Diagnosis not present

## 2020-08-05 DIAGNOSIS — C9201 Acute myeloblastic leukemia, in remission: Secondary | ICD-10-CM | POA: Diagnosis not present

## 2020-08-05 DIAGNOSIS — D61818 Other pancytopenia: Secondary | ICD-10-CM | POA: Diagnosis not present

## 2020-08-05 DIAGNOSIS — Z9484 Stem cells transplant status: Secondary | ICD-10-CM | POA: Diagnosis not present

## 2020-08-09 DIAGNOSIS — D61818 Other pancytopenia: Secondary | ICD-10-CM | POA: Diagnosis not present

## 2020-08-09 DIAGNOSIS — Z9484 Stem cells transplant status: Secondary | ICD-10-CM | POA: Diagnosis not present

## 2020-08-09 DIAGNOSIS — C9201 Acute myeloblastic leukemia, in remission: Secondary | ICD-10-CM | POA: Diagnosis not present

## 2020-08-09 DIAGNOSIS — C3412 Malignant neoplasm of upper lobe, left bronchus or lung: Secondary | ICD-10-CM | POA: Diagnosis not present

## 2020-08-12 DIAGNOSIS — I5023 Acute on chronic systolic (congestive) heart failure: Secondary | ICD-10-CM | POA: Diagnosis not present

## 2020-08-12 DIAGNOSIS — Z9481 Bone marrow transplant status: Secondary | ICD-10-CM | POA: Diagnosis not present

## 2020-08-12 DIAGNOSIS — C9201 Acute myeloblastic leukemia, in remission: Secondary | ICD-10-CM | POA: Diagnosis not present

## 2020-08-12 DIAGNOSIS — D469 Myelodysplastic syndrome, unspecified: Secondary | ICD-10-CM | POA: Diagnosis not present

## 2020-11-09 ENCOUNTER — Ambulatory Visit (INDEPENDENT_AMBULATORY_CARE_PROVIDER_SITE_OTHER): Payer: Medicare Other | Admitting: Legal Medicine

## 2020-11-09 ENCOUNTER — Encounter: Payer: Self-pay | Admitting: Legal Medicine

## 2020-11-09 ENCOUNTER — Other Ambulatory Visit: Payer: Self-pay

## 2020-11-09 VITALS — BP 110/58 | HR 74 | Temp 97.3°F | Resp 16 | Ht 70.5 in | Wt 211.0 lb

## 2020-11-09 DIAGNOSIS — I5022 Chronic systolic (congestive) heart failure: Secondary | ICD-10-CM | POA: Diagnosis not present

## 2020-11-09 DIAGNOSIS — R7303 Prediabetes: Secondary | ICD-10-CM

## 2020-11-09 DIAGNOSIS — I4819 Other persistent atrial fibrillation: Secondary | ICD-10-CM | POA: Diagnosis not present

## 2020-11-09 DIAGNOSIS — C9200 Acute myeloblastic leukemia, not having achieved remission: Secondary | ICD-10-CM

## 2020-11-09 DIAGNOSIS — Z952 Presence of prosthetic heart valve: Secondary | ICD-10-CM

## 2020-11-09 DIAGNOSIS — J410 Simple chronic bronchitis: Secondary | ICD-10-CM

## 2020-11-09 DIAGNOSIS — Z9481 Bone marrow transplant status: Secondary | ICD-10-CM

## 2020-11-09 DIAGNOSIS — G603 Idiopathic progressive neuropathy: Secondary | ICD-10-CM | POA: Diagnosis not present

## 2020-11-09 DIAGNOSIS — J9601 Acute respiratory failure with hypoxia: Secondary | ICD-10-CM

## 2020-11-09 DIAGNOSIS — C3492 Malignant neoplasm of unspecified part of left bronchus or lung: Secondary | ICD-10-CM

## 2020-11-09 DIAGNOSIS — I110 Hypertensive heart disease with heart failure: Secondary | ICD-10-CM

## 2020-11-09 DIAGNOSIS — D469 Myelodysplastic syndrome, unspecified: Secondary | ICD-10-CM

## 2020-11-09 MED ORDER — LORAZEPAM 1 MG PO TABS: 1.0000 mg | ORAL_TABLET | Freq: Two times a day (BID) | ORAL | 5 refills | Status: DC

## 2020-11-09 MED ORDER — GABAPENTIN 300 MG PO CAPS: 300.0000 mg | ORAL_CAPSULE | Freq: Three times a day (TID) | ORAL | 6 refills | Status: AC

## 2020-11-09 NOTE — Progress Notes (Signed)
Subjective:  Patient ID: Patrick Durham, male    DOB: 07/26/1951  Age: 70 y.o. MRN: [id number]  Chief Complaint  Patient presents with  . MDS  . Peripheral Neuropathy  . Prediabetes    HPI: chronic visit  MDS: patient had AML 2013 and treated.  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 2019 had failure on stem cell transplant and has now MDS and is on platelet transfusion and chemotherapy that is now stopped due to recurrent hospitalizatin for opportunistic infections. He was transfused yesterday and gets platelet transfusion  Patient presents for follow up of hypertension.  Patient tolerating furosemide well with side effects.  Patient was diagnosed with hypertension [id number] has been treated for hypertension for 10 years.Patient is working on maintaining diet and exercise regimen and follows up as directed. Complication include CHF  .Patient presents with HFrEF  that is stable. Diagnosis made 2018.  The course of the disease is stable.  Current medicines include furosemide and metolozone, . Patient follows a low cholesterol diet and maintains a weight diary.  Patient is on low salt, low cholesterol diet and avoids alcohol.  Patient denies adverse effects of medicines. Patient is monitoring weight and has no changes of weight.  Patient is having no pedal edema, no PND and no PND.  Patient is continuing to see cardiology.     Current Outpatient Medications on File Prior to Visit  Medication Sig Dispense Refill  . acetaminophen (TYLENOL) 325 MG tablet Take by mouth.    Marland Kitchen acyclovir (ZOVIRAX) 400 MG tablet Take 400 mg by mouth 2 (two) times daily.    Marland Kitchen albuterol (VENTOLIN HFA) 108 (90 Base) MCG/ACT inhaler Inhale 2 puffs into the lungs every 8 (eight) hours as needed. 18 g 6  . ANORO ELLIPTA 62.5-25 MCG/INH AEPB Inhale 1 puff into the lungs daily.    . Carboxymethylcellulose Sod PF 1 % GEL Use as needed for dry eyes    . ceFEPIme (MAXIPIME) 2 g injection     . cefpodoxime (VANTIN) 200 MG tablet Take  200 mg by mouth 2 (two) times daily.    . fluconazole (DIFLUCAN) 200 MG tablet     . furosemide (LASIX) 20 MG tablet Take 40 mg in AM and 20 mg in PM.    . Guaifenesin 1200 MG TB12 Take by mouth.    . INCRUSE ELLIPTA 62.5 MCG/INH AEPB Inhale 1 puff into the lungs daily.    Marland Kitchen loratadine (CLARITIN) 10 MG tablet Take 10 mg by mouth daily.    . Magnesium Oxide 250 MG TABS Take by mouth.    . metolazone (ZAROXOLYN) 2.5 MG tablet Take by mouth.    . oxyCODONE (OXY IR/ROXICODONE) 5 MG immediate release tablet Take by mouth.    . OXYGEN Updated orders- O2 at 3 L via nasal cannula (gaseous portable/stationary concentrator) with exertion and sleep.  Please provide light weight portable with OCD.  Please titrate the oxygen with the OCD to keep O2 sat > 90.  LON: 99 months See attached testing. DME provider:  [place] Patient fax [phone]    . pantoprazole (PROTONIX) 40 MG tablet Take 40 mg by mouth daily.    . simethicone (MYLICON) 680 MG chewable tablet Chew by mouth.    . sodium chloride (OCEAN) 0.65 % nasal spray Place into the nose.    . traZODone (DESYREL) 50 MG tablet Take 50 mg by mouth at bedtime.     No current facility-administered medications on file prior to visit.  Past Medical History:  Diagnosis Date  . 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 Bronchiectasis ([place])   . Chronic atrial fibrillation ([place])    a. Dx 11/2013 -> s/p attempted dccv x 2 (11/2013, 11/2014);  b. CHA2DS2VASc = 1-2 (? h/o CHF - uses prn lasix)-->Eliquis 5 bid.  . Chronic systolic (congestive) heart failure ([place])   . Hypertensive heart disease with heart failure ([place])   . Idiopathic progressive neuropathy   . Leukemia ([place])   . Obstructive sleep apnea   . OSA (obstructive sleep apnea)    on CPAP  . Other persistent atrial fibrillation ([place])   . Prediabetes    Past Surgical History:  Procedure Laterality Date  . HEMORRHOID SURGERY    . WRIST FRACTURE SURGERY      Family History  Problem Relation Age  of Onset  . Other Other        no premature CAD  . Heart failure Mother   . Heart attack Mother   . Heart attack Father    Social History   Socioeconomic History  . Marital status: Married    Spouse name: Not on file  . Number of children: Not on file  . Years of education: Not on file  . Highest education level: Not on file  Occupational History  . Not on file  Tobacco Use  . Smoking status: Former Smoker    Packs/day: 1.00    Years: 40.00    Pack years: 40.00    Types: Cigarettes  . Smokeless tobacco: Never Used  . Tobacco comment: quit 2013  Vaping Use  . Vaping Use: Never used  Substance and Sexual Activity  . Alcohol use: No    Alcohol/week: 0.0 standard drinks    Comment: prev heavy drinker - quit ~ 2013  . Drug use: No  . Sexual activity: Not Currently  Other Topics Concern  . Not on file  Social History Narrative   Lives in [place] with his wife.  Very active @ home.   Social Determinants of Health   Financial Resource Strain: Not on file  Food Insecurity: Not on file  Transportation Needs: Not on file  Physical Activity: Not on file  Stress: Not on file  Social Connections: Not on file    Review of Systems  Constitutional: Negative for activity change and appetite change.  Eyes: Negative for visual disturbance.  Respiratory: Positive for shortness of breath. Negative for chest tightness.   Cardiovascular: Negative for chest pain, palpitations and leg swelling.  Gastrointestinal: Negative for abdominal distention.  Genitourinary: Negative for difficulty urinating, dysuria and urgency.  Musculoskeletal: Negative for arthralgias.  Skin: Negative.   Neurological: Negative.   Psychiatric/Behavioral: Negative.      Objective:  BP (!) 110/58   Pulse 74   Temp (!) 97.3 F (36.3 C)   Resp 16   Ht 5' 10.5" (1.791 m)   Wt 211 lb (95.7 kg)   BMI 29.85 kg/m   BP/Weight 11/09/2020 01/06/2020 03/25/7516  Systolic BP 001 749 449  Diastolic BP 58 61 90   Wt. (Lbs) 211 201.6 208.56  BMI 29.85 28.93 29.92    Physical Exam Vitals reviewed.  Constitutional:      Appearance: Normal appearance. He is normal weight.  HENT:     Head: Normocephalic and atraumatic.     Right Ear: Tympanic membrane, ear canal and external ear normal.     Left Ear: Tympanic membrane, ear canal and external ear normal.  Mouth/Throat:     Mouth: Mucous membranes are moist.     Pharynx: Oropharynx is clear.  Eyes:     Extraocular Movements: Extraocular movements intact.     Conjunctiva/sclera: Conjunctivae normal.     Pupils: Pupils are equal, round, and reactive to light.  Cardiovascular:     Rate and Rhythm: Normal rate and regular rhythm.     Pulses: Normal pulses.     Heart sounds: Normal heart sounds. No murmur heard. No gallop.   Pulmonary:     Effort: Pulmonary effort is normal. No respiratory distress.     Breath sounds: Normal breath sounds. No rales.  Abdominal:     General: Abdomen is flat. Bowel sounds are normal. There is no distension.     Palpations: Abdomen is soft.     Tenderness: There is no abdominal tenderness.  Musculoskeletal:        General: Normal range of motion.     Cervical back: Normal range of motion.  Skin:    General: Skin is warm and dry.     Capillary Refill: Capillary refill takes less than 2 seconds.  Neurological:     General: No focal deficit present.     Mental Status: He is alert and oriented to person, place, and time. Mental status is at baseline.       Lab Results  Component Value Date   WBC 3.5 01/05/2020   HGB 8.8 01/05/2020   HCT 25 (A) 01/05/2020   PLT 112 01/05/2020   GLUCOSE 182 (A) 01/05/2020   ALT 50 11/01/2015   AST 55 (H) 11/01/2015   NA 136 11/03/2015   K 4.3 01/05/2020   CL 106 11/03/2015   CREATININE 1.21 01/05/2020   BUN 23 01/05/2020   CO2 25 11/03/2015   INR 1.1 01/02/2020      Assessment & Plan:   1. Idiopathic progressive neuropathy Patient has neuropathy from  chemotherapy and is on gabapentin  2. Prediabetes Patient has prediabetes and is on diet  3. Chronic systolic (congestive) heart failure [facility]) An individualized care plan was established and reinforced.  The patient's disease status was assessed using clinical finding son exam today, labs, and/or other diagnostic testing such as x-rays, to determine the patient's success in meeting treatmentgoalsbased on disease-based guidelines and found to beimproving. But not at goal yet. Medications prescriptions no changes Laboratory tests ordered to be performed today include none. RECOMMENDATIONS: given include see cardiology.  Call physician is patient gains 3 lbs in one day or 5 lbs for one week.  Call for progressive PND, orthopnea or increased pedal edema.  4. Other persistent atrial fibrillation [facility]) Patient has a diagnosis of persistent atrial fibrillation.   Patient is on no anticoagulant and has controlled ventricular response.  Patient is CV stable.  5. 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 Hypertensive heart disease with heart failure [facility]) An individual hypertension care plan was established and reinforced today.  The patient's status was assessed using clinical findings on exam and labs or diagnostic tests. The patient's success at meeting treatment goals on disease specific evidence-based guidelines and found to be well controlled. SELF MANAGEMENT: The patient and I together assessed ways to personally work towards obtaining the recommended goals. RECOMMENDATIONS: avoid decongestants found in common cold remedies, decrease consumption of alcohol, perform routine monitoring of BP with home BP cuff, exercise, reduction of dietary salt, take medicines as prescribed, try not to miss doses  and quit smoking.  Regular exercise and maintaining a healthy weight is needed.  Stress reduction may help. A CLINICAL SUMMARY including written plan identify  barriers to care unique to individual due to social or financial issues.  We attempt to mutually creat solutions for individual and family understanding.  7. MDS (myelodysplastic syndrome) ([place]) Patient has myelodysplastic syndrome after AML and stem cell transplant, he is getting chemotherapy and RBC and platelet transfusions  8. Acute myeloid leukemia not having achieved remission [facility]) Patient has AML requiring treatment and eventually stem cell transplant  9. S/P allogeneic bone marrow transplant [facility]) Patient had stem cell transplant that is failing. He is seen at [facility].  10. Simple chronic bronchitis ([place]) An individualize plan was formulated for care of COPD.  Treatment is evidence based.  She will continue on inhalers, avoid smoking and smoke.  Regular exercise with help with dyspnea. Routine follow ups and medication compliance is needed.  11. Acute respiratory failure with hypoxia ([place]) Patient is on O2 2L/minute for respiratory failure          I spent 35 minutes dedicated to the care of this patient on the date of this encounter to include face-to-face time with the patient, as well as: review of oncology records  Follow-up: Return in about 6 months (around 05/12/2021) for fasting.  An After Visit Summary was printed and given to the patient.  Reinaldo Meeker, MD [facility] [phone]

## 2020-11-24 ENCOUNTER — Other Ambulatory Visit: Payer: Self-pay

## 2020-11-24 DIAGNOSIS — I5022 Chronic systolic (congestive) heart failure: Secondary | ICD-10-CM

## 2020-11-24 MED ORDER — LORAZEPAM 1 MG PO TABS: 1.0000 mg | ORAL_TABLET | Freq: Two times a day (BID) | ORAL | 2 refills | Status: AC

## 2020-11-24 NOTE — Telephone Encounter (Signed)
Pt's wife calling stating insurance will not fill. This medication is having to be filled every 14-15 days. Requesting 30 day supply be sent so it is easier.   Royce Macadamia, [place] 11/24/20 11:03 AM

## 2020-11-24 NOTE — Telephone Encounter (Signed)
Pts original fill was for 30 tablets equaling 15 day supply bid. Pended is with 60 tablets to equal a 30 day supply taking bid.   Royce Macadamia, [place] 11/24/20 11:56 AM

## 2021-02-11 DIAGNOSIS — D469 Myelodysplastic syndrome, unspecified: Secondary | ICD-10-CM

## 2021-02-11 DIAGNOSIS — R7881 Bacteremia: Secondary | ICD-10-CM | POA: Diagnosis not present

## 2021-02-11 DIAGNOSIS — C3412 Malignant neoplasm of upper lobe, left bronchus or lung: Secondary | ICD-10-CM

## 2021-02-11 DIAGNOSIS — C9250 Acute myelomonocytic leukemia, not having achieved remission: Secondary | ICD-10-CM | POA: Diagnosis not present

## 2021-02-11 DIAGNOSIS — I4891 Unspecified atrial fibrillation: Secondary | ICD-10-CM

## 2021-02-11 DIAGNOSIS — D7581 Myelofibrosis: Secondary | ICD-10-CM

## 2021-02-11 DIAGNOSIS — M25512 Pain in left shoulder: Secondary | ICD-10-CM

## 2021-02-11 DIAGNOSIS — Z452 Encounter for adjustment and management of vascular access device: Secondary | ICD-10-CM

## 2021-02-11 DIAGNOSIS — B952 Enterococcus as the cause of diseases classified elsewhere: Secondary | ICD-10-CM | POA: Diagnosis not present

## 2021-02-11 DIAGNOSIS — Z1612 Extended spectrum beta lactamase (ESBL) resistance: Secondary | ICD-10-CM | POA: Diagnosis not present

## 2021-05-14 DEATH — deceased
